# Patient Record
Sex: Male | Born: 1975
Health system: Southern US, Community
[De-identification: ages and names within clinical notes are randomized; demographics above are authoritative.]

## PROBLEM LIST (undated history)

## (undated) DIAGNOSIS — F419 Anxiety disorder, unspecified: Secondary | ICD-10-CM

## (undated) DIAGNOSIS — F909 Attention-deficit hyperactivity disorder, unspecified type: Secondary | ICD-10-CM

## (undated) HISTORY — PX: KNEE SURGERY: SHX244

## (undated) HISTORY — DX: Attention-deficit hyperactivity disorder, unspecified type: F90.9

## (undated) HISTORY — DX: Anxiety disorder, unspecified: F41.9

## (undated) HISTORY — PX: APPENDECTOMY: SHX54

---

## 2009-08-02 ENCOUNTER — Ambulatory Visit: Payer: Self-pay | Admitting: Urology

## 2009-08-12 ENCOUNTER — Emergency Department: Payer: Self-pay | Admitting: Emergency Medicine

## 2014-10-10 ENCOUNTER — Ambulatory Visit: Payer: Self-pay | Admitting: General Surgery

## 2014-10-12 ENCOUNTER — Ambulatory Visit (INDEPENDENT_AMBULATORY_CARE_PROVIDER_SITE_OTHER): Payer: Managed Care, Other (non HMO) | Admitting: General Surgery

## 2014-10-12 ENCOUNTER — Encounter: Payer: Self-pay | Admitting: General Surgery

## 2014-10-12 VITALS — BP 120/74 | HR 80 | Resp 12 | Ht 66.0 in | Wt 165.0 lb

## 2014-10-12 DIAGNOSIS — R229 Localized swelling, mass and lump, unspecified: Secondary | ICD-10-CM

## 2014-10-12 DIAGNOSIS — R222 Localized swelling, mass and lump, trunk: Secondary | ICD-10-CM

## 2014-10-12 NOTE — Patient Instructions (Addendum)
Patient to have a excision right back mass.   Patient's surgery has been scheduled for 10-17-14 at [facility].

## 2014-10-12 NOTE — Progress Notes (Signed)
Patient ID: Joel Burgess, male   DOB: Mar 04, 1976, 38 y.o.   MRN: [id number]  Chief Complaint  Patient presents with  . Other    lump on back    HPI Joel Burgess is a 38 y.o. male here today for a evaluation of a lump on back.Patient states he noticed this area about month ago. He slates the area is getting bigger.Pain with activity.   HPI  History reviewed. No pertinent past medical history.  Past Surgical History  Procedure Laterality Date  . Appendectomy      History reviewed. No pertinent family history.  Social History History  Substance Use Topics  . Smoking status: Never Smoker   . Smokeless tobacco: Never Used  . Alcohol Use: No    Allergies  Allergen Reactions  . Penicillins Swelling    Current Outpatient Prescriptions  Medication Sig Dispense Refill  . amphetamine-dextroamphetamine (ADDERALL) 30 MG tablet Take 30 mg by mouth daily.        No current facility-administered medications for this visit.    Review of Systems Review of Systems  Constitutional: Negative.   Respiratory: Negative.   Cardiovascular: Negative.     Blood pressure 120/74, pulse 80, resp. rate 12, height 5\' 6"  (1.676 m), weight 165 lb (74.844 kg).  Physical Exam Physical Exam  Constitutional: He is oriented to person, place, and time. He appears well-developed and well-nourished.  Eyes: Conjunctivae are normal.  Neck: Neck supple. No tracheal deviation present. No thyromegaly present.  Cardiovascular: Normal rate, regular rhythm and normal heart sounds.   Pulmonary/Chest: Effort normal and breath sounds normal.  Neurological: He is alert and oriented to person, place, and time.  Skin: Skin is warm and dry.  6 by 4 mass in the upper right back below the scapula.  No fixity to muscle  Data Reviewed Notes reviewed  Assessment    Right back lipoma     Plan    Discussed excision on back. Patient agrees. Feel this is best done under MAC.    Patient's surgery has been  scheduled for 10-17-14 at [facility].   Tyquon Near G 10/12/2014, 10:07 AM

## 2014-10-17 ENCOUNTER — Encounter: Payer: Self-pay | Admitting: General Surgery

## 2014-10-17 ENCOUNTER — Ambulatory Visit: Payer: Self-pay | Admitting: General Surgery

## 2014-10-17 DIAGNOSIS — D171 Benign lipomatous neoplasm of skin and subcutaneous tissue of trunk: Secondary | ICD-10-CM

## 2014-10-18 ENCOUNTER — Encounter: Payer: Self-pay | Admitting: General Surgery

## 2014-10-19 ENCOUNTER — Telehealth: Payer: Self-pay | Admitting: *Deleted

## 2014-10-19 NOTE — Telephone Encounter (Signed)
Notified patient as instructed, patient pleased. He is using over the counter pain medications. Discussed follow-up appointments, patient agrees.

## 2014-10-19 NOTE — Telephone Encounter (Signed)
Message copied by Carson Myrtle on Wed Oct 19, 2014  9:44 AM ------      Message from: Christene Lye      Created: Wed Oct 19, 2014  6:30 AM       Please let pt pt know the pathology was normal. ------

## 2014-10-27 ENCOUNTER — Ambulatory Visit: Payer: Managed Care, Other (non HMO) | Admitting: General Surgery

## 2014-11-03 ENCOUNTER — Encounter: Payer: Self-pay | Admitting: *Deleted

## 2015-03-31 DIAGNOSIS — F411 Generalized anxiety disorder: Secondary | ICD-10-CM | POA: Insufficient documentation

## 2015-03-31 DIAGNOSIS — F988 Other specified behavioral and emotional disorders with onset usually occurring in childhood and adolescence: Secondary | ICD-10-CM | POA: Insufficient documentation

## 2015-04-15 NOTE — Op Note (Signed)
PATIENT NAME:  Joel Burgess, Joel Burgess MR#:  [id number] DATE OF BIRTH:  10/26/1976  DATE OF PROCEDURE:  10/17/2014  PREOPERATIVE DIAGNOSIS: Lipoma, upper back, right.   POSTOPERATIVE DIAGNOSIS: Lipoma, upper back, right.  OPERATION: Excision of subcutaneous lipoma from the right upper back, below the right scapula.   SURGEON: Mckinley Jewel, MD   ANESTHESIA: Monitored anesthesia care using of 0.5% Marcaine mixed with 1% Xylocaine; a total of 20 mL.   COMPLICATIONS: None.   DRAINS: None.   PROCEDURE IN DETAIL: The patient was placed in the left lateral position, after he was adequately monitored and sedated and held in place with a bean bag. The mass in question was about a 5-6 cm long mass located below the level of the inferior angle of the scapula. This area was prepped and draped out as a sterile field. Timeout was performed. Local anesthetic was instilled to obtain an adequate block overlying the mass.   A skin incision was made along the long axis of this mass, about 3 cm in size and carefully deepened through into the subcutaneous tissue, where the lipomatous mass was encountered. It was somewhat firmly stuck in places to the skin, and also in the areas to the fascia, but did not penetrate the fascia. Using blunt and sharp dissection, this was excised out fully and sent to pathology.   Hemostasis was obtained with cautery. The deeper tissue was closed with 3-0 Vicryl interrupted stitches and the skin was closed with subcuticular 3-0 Monocryl, reinforced with Steri-Strips. Tincture of benzoin and dry sterile dressing was placed.   The patient tolerated the procedure well with no immediate problems encountered. He was subsequently returned to the recovery room in stable condition.   ____________________________ S.Robinette Haines, MD sgs:MT D: 10/17/2014 08:27:22 ET T: 10/17/2014 15:18:45 ET JOB#: [id number]  cc: Synthia Innocent. Jamal Collin, MD, <Dictator> [facility] Robinette Haines MD ELECTRONICALLY SIGNED 10/17/2014  17:55

## 2015-04-17 LAB — SURGICAL PATHOLOGY

## 2015-06-27 ENCOUNTER — Telehealth: Payer: Self-pay | Admitting: Psychiatry

## 2015-07-03 ENCOUNTER — Telehealth: Payer: Self-pay | Admitting: Psychiatry

## 2015-07-04 NOTE — Telephone Encounter (Signed)
See the other note that I already address this question. Unable to replace Adderall prescriptions.

## 2015-07-04 NOTE — Telephone Encounter (Signed)
Please advise patient that I cannot refill or replace scheduled 2 prescriptions.

## 2015-07-25 ENCOUNTER — Ambulatory Visit (INDEPENDENT_AMBULATORY_CARE_PROVIDER_SITE_OTHER): Payer: Managed Care, Other (non HMO) | Admitting: Psychiatry

## 2015-07-25 ENCOUNTER — Encounter: Payer: Self-pay | Admitting: Psychiatry

## 2015-07-25 VITALS — BP 124/80 | HR 76 | Temp 97.8°F | Ht 66.0 in | Wt 169.4 lb

## 2015-07-25 DIAGNOSIS — F902 Attention-deficit hyperactivity disorder, combined type: Secondary | ICD-10-CM

## 2015-07-25 DIAGNOSIS — F411 Generalized anxiety disorder: Secondary | ICD-10-CM | POA: Diagnosis not present

## 2015-07-25 MED ORDER — AMPHETAMINE-DEXTROAMPHETAMINE 30 MG PO TABS: 30.0000 mg | ORAL_TABLET | Freq: Every day | ORAL | Status: DC

## 2015-07-25 MED ORDER — ALPRAZOLAM 1 MG PO TABS: 1.0000 mg | ORAL_TABLET | Freq: Two times a day (BID) | ORAL | Status: DC | PRN

## 2015-07-25 NOTE — Progress Notes (Signed)
[facility] MD Progress Note  07/25/2015 10:11 PM Joel Burgess  MRN:  [id number] Subjective:  Follow-up for this patient with ADHD and anxiety. Principal Problem: @PPROB @ Diagnosis:  Attention deficit hyperactivity disorder Patient Active Problem List   Diagnosis Date Noted  . Anxiety, generalized [F41.1] 03/31/2015  . ADD (attention deficit disorder) [F90.9] 03/31/2015   Total Time spent with patient: 30 minutes   Past Medical History:  Past Medical History  Diagnosis Date  . ADHD (attention deficit hyperactivity disorder)   . Anxiety     Past Surgical History  Procedure Laterality Date  . Appendectomy     Family History:  Family History  Problem Relation Age of Onset  . Anxiety disorder Mother   . Depression Mother   . Hypertension Mother   . Anxiety disorder Sister   . ADD / ADHD Sister   . Bipolar disorder Sister   . Hypertension Father   . Depression Father   . Bipolar disorder Maternal Uncle   . Schizophrenia Maternal Uncle    Social History:  History  Alcohol Use No     History  Drug Use No    History   Social History  . Marital Status: Married    Spouse Name: N/A  . Number of Children: N/A  . Years of Education: N/A   Social History Main Topics  . Smoking status: Never Smoker   . Smokeless tobacco: Never Used  . Alcohol Use: No  . Drug Use: No  . Sexual Activity: Yes    Birth Control/ Protection: None   Other Topics Concern  . None   Social History Narrative   Additional History:    Sleep: Good  Appetite:  Good   Assessment: since our last visit he has gone several weeks without his medicine because he claims that he destroyed one of the prescriptions by accident and the laundry. I was not able to give him a new one. Therefore he has had a period of time of having more disorganization and work and being a little slower with less concentration. Mood however is been stable. Continues to sleep well. His son is scheduled to come home to live  within another couple weeks. He is very much looking forward to this. No sign of any side effects or complications of medicine  Musculoskeletal: Strength & Muscle Tone: within normal limits Gait & Station: normal Patient leans: N/A   Psychiatric Specialty Exam: Physical Exam  Constitutional: He appears well-developed and well-nourished.  HENT:  Head: Normocephalic and atraumatic.  Eyes: Conjunctivae are normal. Pupils are equal, round, and reactive to light.  Neck: Normal range of motion.  Cardiovascular: Normal heart sounds.   Respiratory: Effort normal.  GI: Soft.  Musculoskeletal: Normal range of motion.  Neurological: He is alert.  Skin: Skin is warm and dry.  Psychiatric: He has a normal mood and affect. His speech is normal and behavior is normal. Judgment and thought content normal. Cognition and memory are normal.    Review of Systems  Constitutional: Negative.   HENT: Negative.   Eyes: Negative.   Respiratory: Negative.   Cardiovascular: Negative.   Gastrointestinal: Negative.   Musculoskeletal: Negative.   Skin: Negative.   Neurological: Negative.   Psychiatric/Behavioral: Negative for depression, suicidal ideas, hallucinations, memory loss and substance abuse. The patient is not nervous/anxious and does not have insomnia.     Blood pressure 124/80, pulse 76, temperature 97.8 F (36.6 C), temperature source Tympanic, height 5\' 6"  (1.676 m), weight 169 lb 6.4  oz (76.839 kg), SpO2 91 %.Body mass index is 27.35 kg/(m^2).  General Appearance: Casual  Eye Contact::  Good  Speech:  Clear and Coherent  Volume:  Normal  Mood:  Negative  Affect:  Negative  Thought Process:  Goal Directed  Orientation:  Full (Time, Place, and Person)  Thought Content:  Negative  Suicidal Thoughts:  No  Homicidal Thoughts:  No  Memory:  Immediate;   Good Recent;   Good Remote;   Good  Judgement:  Intact  Insight:  Present  Psychomotor Activity:  Normal  Concentration:  Good   Recall:  Good  Fund of Knowledge:Good  Language: Fair  Akathisia:  No  Handed:  Right  AIMS (if indicated):     Assets:  Communication Skills Desire for Improvement Housing Physical Health Resilience Social Support  ADL's:  Intact  Cognition: WNL  Sleep:        Current Medications: Current Outpatient Prescriptions  Medication Sig Dispense Refill  . ALPRAZolam (XANAX) 1 MG tablet Take 1 tablet (1 mg total) by mouth 2 (two) times daily as needed for anxiety. 30 tablet 2  . amphetamine-dextroamphetamine (ADDERALL) 30 MG tablet Take 1 tablet by mouth daily. 30 tablet 0  . diazepam (VALIUM) 10 MG tablet      No current facility-administered medications for this visit.    Lab Results: No results found for this or any previous visit (from the past 48 hour(s)).  Physical Findings: AIMS:  , ,  ,  ,    CIWA:    COWS:     Treatment Plan Summary: Medication management and Plan patient was given 3 new prescription for sore his Adderall. One of them that could be filled immediately and 2 more that I wrote out on paper that are dated for the next 2 months. Continue the Xanax at a new prescription was given for that. Reviewed medicine side effects. Supportive counseling especially regarding his situation with his son. Follow-up in 3 months. He agrees to the plan.   Medical Decision Making:  Established Problem, Stable/Improving (1), Review of Psycho-Social Stressors (1) and Review of Medication Regimen & Side Effects (2)     Joel Burgess 07/25/2015, 10:11 PM

## 2015-07-26 ENCOUNTER — Other Ambulatory Visit: Payer: Self-pay

## 2015-07-26 DIAGNOSIS — F988 Other specified behavioral and emotional disorders with onset usually occurring in childhood and adolescence: Secondary | ICD-10-CM

## 2015-07-26 NOTE — Telephone Encounter (Signed)
Patient was just seen yesterday and was given 3 prescriptions for this medication. I'm unclear what I am supposed to do with this current request.

## 2015-07-26 NOTE — Telephone Encounter (Signed)
pt called. pt states he noticed on his rx yesterday that you only gave him #30 tablets for his adderall. pt states that he take the adderall twice a day so he needs #60 pills in order to have a month supply.

## 2015-07-27 NOTE — Telephone Encounter (Signed)
the rx for pt adderall was only written for #30 tablets not the #60 tablets pt will need new rx for #60 tablets in order to last him the whole month.

## 2015-09-06 NOTE — Telephone Encounter (Signed)
I left a voicemail for him that I will write new prescriptions for the correct amount. He can pick them up at the front desk and please bring in the other prescriptions to trade in for them.

## 2015-10-24 ENCOUNTER — Ambulatory Visit: Payer: Managed Care, Other (non HMO) | Admitting: Psychiatry

## 2015-11-08 ENCOUNTER — Ambulatory Visit: Payer: Self-pay | Admitting: Family Medicine

## 2015-11-08 ENCOUNTER — Encounter: Payer: Self-pay | Admitting: Family Medicine

## 2015-11-08 ENCOUNTER — Ambulatory Visit (INDEPENDENT_AMBULATORY_CARE_PROVIDER_SITE_OTHER): Payer: Managed Care, Other (non HMO) | Admitting: Family Medicine

## 2015-11-08 VITALS — BP 118/64 | HR 70 | Temp 98.1°F | Resp 16 | Wt 170.0 lb

## 2015-11-08 DIAGNOSIS — H6092 Unspecified otitis externa, left ear: Secondary | ICD-10-CM

## 2015-11-08 DIAGNOSIS — F411 Generalized anxiety disorder: Secondary | ICD-10-CM

## 2015-11-08 DIAGNOSIS — M62838 Other muscle spasm: Secondary | ICD-10-CM

## 2015-11-08 DIAGNOSIS — J45909 Unspecified asthma, uncomplicated: Secondary | ICD-10-CM

## 2015-11-08 MED ORDER — DIAZEPAM 10 MG PO TABS: 10.0000 mg | ORAL_TABLET | Freq: Every day | ORAL | Status: DC | PRN

## 2015-11-08 MED ORDER — ALBUTEROL SULFATE HFA 108 (90 BASE) MCG/ACT IN AERS: 2.0000 | INHALATION_SPRAY | Freq: Four times a day (QID) | RESPIRATORY_TRACT | Status: DC | PRN

## 2015-11-08 MED ORDER — AZITHROMYCIN 250 MG PO TABS: ORAL_TABLET | ORAL | Status: AC

## 2015-11-08 MED ORDER — ALPRAZOLAM 1 MG PO TABS: 1.0000 mg | ORAL_TABLET | Freq: Two times a day (BID) | ORAL | Status: DC | PRN

## 2015-11-08 MED ORDER — NEOMYCIN-POLYMYXIN-HC 1 % OT SOLN: 3.0000 [drp] | Freq: Four times a day (QID) | OTIC | Status: DC

## 2015-11-08 NOTE — Progress Notes (Signed)
Patient: Joel Burgess Male    DOB: [birth year]   39 y.o.   MRN: [id number] Visit Date: 11/08/2015  Today's Provider: Lelon Huh, MD   Chief Complaint  Patient presents with  . Ear Pain    x 3 days   Subjective:    HPI  Ear pain: Patient comes in stating he has had left ear pain for 3 days. Patient states the pain is worsening. Pain is described as a sharp pain at times and sometimes a throbbing pain. Patient is aggravated with loud noises and cold air. Patient has tried taking Ibuprofen with little to no relief. Patient states he has also had pain in his neck. Patient has also had intermittent fever since the onset of the ear pain.   Patient also requests refills for alprazolam, which he takes a few times a week for anxiety and is working well. He also takes diazepam occasionally for muscle pains which he states works well. He does not take both medications together  He states he sometimes gets mild wheezing when he has a cold or during allergies season. He was previously prescribed albuterol inhaler which worked well, but has run out. He requests a refill for this.     Allergies  Allergen Reactions  . Penicillins Swelling   Previous Medications   ALPRAZOLAM (XANAX) 1 MG TABLET    Take 1 tablet (1 mg total) by mouth 2 (two) times daily as needed for anxiety.   AMPHETAMINE-DEXTROAMPHETAMINE (ADDERALL) 30 MG TABLET    Take 1 tablet by mouth daily.   DIAZEPAM (VALIUM) 10 MG TABLET    Take 10 mg by mouth daily as needed.     Review of Systems  Constitutional: Positive for fever. Negative for chills, diaphoresis, appetite change and fatigue.  HENT: Positive for ear pain (left ear). Negative for congestion, dental problem, ear discharge, facial swelling, hearing loss, mouth sores, nosebleeds, postnasal drip, rhinorrhea, sinus pressure, sneezing, sore throat, tinnitus, trouble swallowing and voice change.   Respiratory: Negative for chest tightness, shortness of breath and  wheezing.   Cardiovascular: Negative for chest pain and palpitations.  Gastrointestinal: Negative for nausea, vomiting and abdominal pain.  Musculoskeletal: Positive for neck pain.  Neurological: Positive for headaches.    Social History  Substance Use Topics  . Smoking status: Never Smoker   . Smokeless tobacco: Never Used  . Alcohol Use: No   Objective:   BP 118/64 mmHg  Pulse 70  Temp(Src) 98.1 F (36.7 C) (Oral)  Resp 16  Wt 170 lb (77.111 kg)  SpO2 99%  Physical Exam  General Appearance:    Alert, cooperative, no distress  HENT:  Left ear canal and TM mildly erythematous. neck without nodes, throat normal without erythema or exudate, sinuses nontender and nasal mucosa congested  Eyes:    PERRL, conjunctiva/corneas clear, EOM's intact       Lungs:     Clear to auscultation bilaterally, respirations unlabored  Heart:    Regular rate and rhythm  Neurologic:   Awake, alert, oriented x 3. No apparent focal neurological           defect.           Assessment & Plan:     1. Anxiety, generalized Does well with occasional alprazolam. Refilled today - ALPRAZolam (XANAX) 1 MG tablet; Take 1 tablet (1 mg total) by mouth 2 (two) times daily as needed for anxiety.  Dispense: 30 tablet; Refill: 5  2. Otitis externa,  left  - NEOMYCIN-POLYMYXIN-HYDROCORTISONE (CORTISPORIN) 1 % SOLN otic solution; Place 3 drops into the left ear 4 (four) times daily.  Dispense: 10 mL; Refill: 0  3. Muscle spasm Does well with occasional diazepam. Refilled today.  - diazepam (VALIUM) 10 MG tablet; Take 1 tablet (10 mg total) by mouth daily as needed (for musce spasm).  Dispense: 30 tablet; Refill: 5  4. Reactive airway disease, unspecified asthma severity, uncomplicated  - albuterol (PROVENTIL HFA;VENTOLIN HFA) 108 (90 BASE) MCG/ACT inhaler; Inhale 2 puffs into the lungs every 6 (six) hours as needed for wheezing.  Dispense: 1 Inhaler; Refill: 0       Lelon Huh, MD  [facility]

## 2015-11-23 ENCOUNTER — Encounter: Payer: Self-pay | Admitting: Psychiatry

## 2015-11-23 ENCOUNTER — Ambulatory Visit (INDEPENDENT_AMBULATORY_CARE_PROVIDER_SITE_OTHER): Payer: Managed Care, Other (non HMO) | Admitting: Psychiatry

## 2015-11-23 VITALS — BP 124/68 | HR 75 | Temp 97.6°F | Ht 66.0 in | Wt 168.6 lb

## 2015-11-23 DIAGNOSIS — F902 Attention-deficit hyperactivity disorder, combined type: Secondary | ICD-10-CM | POA: Diagnosis not present

## 2015-11-23 DIAGNOSIS — F411 Generalized anxiety disorder: Secondary | ICD-10-CM | POA: Diagnosis not present

## 2015-11-23 MED ORDER — AMPHETAMINE-DEXTROAMPHETAMINE 30 MG PO TABS: 30.0000 mg | ORAL_TABLET | Freq: Two times a day (BID) | ORAL | Status: DC

## 2015-11-23 NOTE — Progress Notes (Signed)
[facility] MD Progress Note  11/23/2015 2:00 PM Joel Burgess  MRN:  [id number] Subjective:  Follow-up for this patient with a history of ADHD and anxiety. Patient has no new complaints today. He states that his anxiety symptoms are well controlled. His focus and concentration are good. He seems a little bit confused to me today. When I ask him about how long his son had been back home he told me that it had been almost a year when I know that 3 months ago he told me his son had not come home yet. I wonder if he is slightly intoxicated or fatigued today. I did discover by checking the old notes and data base that he has been getting extra Xanax from his primary care doctor as well as Valium. Therefore I will be canceling his Xanax prescription here. I don't see that he's been getting any extra stimulants. Patient appears to be stable. We will continue the current dose of ADHD medicine and follow-up in 3 months. Principal Problem: @PPROB @ Diagnosis:   Patient Active Problem List   Diagnosis Date Noted  . Anxiety, generalized [F41.1] 03/31/2015  . ADD (attention deficit disorder) [F90.9] 03/31/2015   Total Time spent with patient: 25 minutes  Past Psychiatric History: Patient has a past history of anxiety and ADHD no suicide attempts  Past Medical History:  Past Medical History  Diagnosis Date  . ADHD (attention deficit hyperactivity disorder)   . Anxiety     Past Surgical History  Procedure Laterality Date  . Appendectomy     Family History:  Family History  Problem Relation Age of Onset  . Anxiety disorder Mother   . Depression Mother   . Hypertension Mother   . Anxiety disorder Sister   . ADD / ADHD Sister   . Bipolar disorder Sister   . Hypertension Father   . Depression Father   . Bipolar disorder Maternal Uncle   . Schizophrenia Maternal Uncle    Family Psychiatric  History: Family history positive for ADHD Social History:  History  Alcohol Use No     History  Drug Use No     Social History   Social History  . Marital Status: Married    Spouse Name: N/A  . Number of Children: 2  . Years of Education: N/A   Occupational History  . 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 Smoking status: Never Smoker   . Smokeless tobacco: Never Used  . Alcohol Use: No  . Drug Use: No  . Sexual Activity: Yes    Birth Control/ Protection: None   Other Topics Concern  . None   Social History Narrative   Additional Social History:                         Sleep: Good  Appetite:  Good  Current Medications: Current Outpatient Prescriptions  Medication Sig Dispense Refill  . albuterol (PROVENTIL HFA;VENTOLIN HFA) 108 (90 BASE) MCG/ACT inhaler Inhale 2 puffs into the lungs every 6 (six) hours as needed for wheezing. 1 Inhaler 0  . amphetamine-dextroamphetamine (ADDERALL) 30 MG tablet Take 1 tablet by mouth 2 (two) times daily. 60 tablet 0  . amphetamine-dextroamphetamine (ADDERALL) 30 MG tablet Take 1 tablet by mouth 2 (two) times daily. 60 tablet 0  . amphetamine-dextroamphetamine (ADDERALL) 30 MG tablet Take 1 tablet by mouth 2 (two) times daily. 60 tablet 0  . diazepam (VALIUM) 10 MG tablet Take 1  tablet (10 mg total) by mouth daily as needed (for musce spasm). 30 tablet 5  . NEOMYCIN-POLYMYXIN-HYDROCORTISONE (CORTISPORIN) 1 % SOLN otic solution Place 3 drops into the left ear 4 (four) times daily. 10 mL 0   No current facility-administered medications for this visit.    Lab Results: No results found for this or any previous visit (from the past 48 hour(s)).  Physical Findings: AIMS:  , ,  ,  ,    CIWA:    COWS:     Musculoskeletal: Strength & Muscle Tone: within normal limits Gait & Station: normal Patient leans: N/A  Psychiatric Specialty Exam: ROS  Blood pressure 124/68, pulse 75, temperature 97.6 F (36.4 C), temperature source Tympanic, height 5\' 6"  (1.676 m), weight 168 lb 9.6 oz (76.476 kg), SpO2 95 %.Body mass index  is 27.23 kg/(m^2).  General Appearance: Fairly Groomed  Engineer, water::  Fair  Speech:  Normal Rate  Volume:  Normal  Mood:  Euthymic  Affect:  Congruent  Thought Process:  Coherent  Orientation:  Full (Time, Place, and Person)  Thought Content:  Negative  Suicidal Thoughts:  No  Homicidal Thoughts:  No  Memory:  Immediate;   Good Recent;   Fair Remote;   Fair  Judgement:  Intact  Insight:  Present  Psychomotor Activity:  Normal  Concentration:  Fair  Recall:  [employer] of Knowledge:Fair  Language: Fair  Akathisia:  No  Handed:  Right  AIMS (if indicated):     Assets:  Communication Skills Desire for Improvement Financial Resources/Insurance Housing Physical Health Resilience  ADL's:  Intact  Cognition: WNL  Sleep:      Treatment Plan Summary: Medication management and Plan review medication usage and side effects. Discontinue Xanax prescription. Continue Adderall 30 mg twice a day. 3 prescriptions written. Follow-up in another 3 months.  Deedra Pro 11/23/2015, 2:00 PM

## 2015-11-25 ENCOUNTER — Other Ambulatory Visit: Payer: Self-pay | Admitting: Psychiatry

## 2015-11-27 NOTE — Telephone Encounter (Signed)
No. I have evidence that the patient has been doctor shopping and receiving prescriptions for sedatives from his primary care doctor and from me. No new prescription is needed at this time.

## 2016-02-15 ENCOUNTER — Other Ambulatory Visit: Payer: Self-pay | Admitting: Family Medicine

## 2016-02-16 NOTE — Telephone Encounter (Signed)
Rx called in to pharmacy. 

## 2016-02-16 NOTE — Telephone Encounter (Signed)
Please call in alprazolam.  

## 2016-02-22 ENCOUNTER — Ambulatory Visit: Payer: Managed Care, Other (non HMO) | Admitting: Psychiatry

## 2016-02-26 ENCOUNTER — Ambulatory Visit: Payer: Managed Care, Other (non HMO) | Admitting: Psychiatry

## 2016-03-04 ENCOUNTER — Encounter: Payer: Self-pay | Admitting: Psychiatry

## 2016-03-04 ENCOUNTER — Ambulatory Visit (INDEPENDENT_AMBULATORY_CARE_PROVIDER_SITE_OTHER): Payer: Managed Care, Other (non HMO) | Admitting: Psychiatry

## 2016-03-04 VITALS — BP 124/80 | HR 74 | Temp 98.0°F | Ht 66.0 in | Wt 169.4 lb

## 2016-03-04 DIAGNOSIS — F902 Attention-deficit hyperactivity disorder, combined type: Secondary | ICD-10-CM

## 2016-03-04 DIAGNOSIS — F411 Generalized anxiety disorder: Secondary | ICD-10-CM

## 2016-03-04 MED ORDER — AMPHETAMINE-DEXTROAMPHETAMINE 30 MG PO TABS: 30.0000 mg | ORAL_TABLET | Freq: Two times a day (BID) | ORAL | Status: DC

## 2016-03-04 NOTE — Progress Notes (Signed)
[facility] MD Progress Note  03/04/2016 2:45 PM Joel Burgess  MRN:  [id number] Subjective: Follow-up for a 40 year old man with ADHD and anxiety.Patient has no new complaint. Mood has been good. No signs of depression. Sleep is good. No new health problems. Appetite is good. His work is going well. His relationship with his family is going well. No real problems with having his son back home. He is not reporting any problems with concentration or focus. No side effects from medicine. Good control of symptoms. Principal Problem: @PPROB @ Diagnosis:   Patient Active Problem List   Diagnosis Date Noted  . Anxiety, generalized [F41.1] 03/31/2015  . ADD (attention deficit disorder) [F90.9] 03/31/2015   Total Time spent with patient: 25  Past Psychiatric History: Past history of ADHD and anxiety no history of suicide attempts no history of psychosis good response to medicine.  Past Medical History:  Past Medical History  Diagnosis Date  . ADHD (attention deficit hyperactivity disorder)   . Anxiety     Past Surgical History  Procedure Laterality Date  . Appendectomy     Family History:  Family History  Problem Relation Age of Onset  . Anxiety disorder Mother   . Depression Mother   . Hypertension Mother   . Anxiety disorder Sister   . ADD / ADHD Sister   . Bipolar disorder Sister   . Hypertension Father   . Depression Father   . Bipolar disorder Maternal Uncle   . Schizophrenia Maternal Uncle    Family Psychiatric  History: Family history likely positive for ADHD and has a son with some mood instability Social History:  History  Alcohol Use No     History  Drug Use No    Social History   Social History  . Marital Status: Married    Spouse Name: N/A  . Number of Children: 2  . Years of Education: N/A   Occupational History  . 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 Smoking status: Never Smoker   . Smokeless tobacco: Never Used  . Alcohol Use: No  . Drug  Use: No  . Sexual Activity: Yes    Birth Control/ Protection: None   Other Topics Concern  . None   Social History Narrative   Additional Social History:                         Sleep: Good  Appetite:  Good  Current Medications: Current Outpatient Prescriptions  Medication Sig Dispense Refill  . albuterol (PROVENTIL HFA;VENTOLIN HFA) 108 (90 BASE) MCG/ACT inhaler Inhale 2 puffs into the lungs every 6 (six) hours as needed for wheezing. 1 Inhaler 0  . ALPRAZolam (XANAX) 1 MG tablet TAKE ONE TABLET BY MOUTH TWICE DAILY AS NEEDED FOR ANXIETY 30 tablet 5  . amphetamine-dextroamphetamine (ADDERALL) 30 MG tablet Take 1 tablet by mouth 2 (two) times daily. 60 tablet 0  . amphetamine-dextroamphetamine (ADDERALL) 30 MG tablet Take 1 tablet by mouth 2 (two) times daily. 60 tablet 0  . amphetamine-dextroamphetamine (ADDERALL) 30 MG tablet Take 1 tablet by mouth 2 (two) times daily. 60 tablet 0  . amphetamine-dextroamphetamine (ADDERALL) 30 MG tablet Take 1 tablet by mouth 2 (two) times daily. 60 tablet 0  . amphetamine-dextroamphetamine (ADDERALL) 30 MG tablet Take 1 tablet by mouth 2 (two) times daily. 60 tablet 0  . diazepam (VALIUM) 10 MG tablet Take 1 tablet (10 mg total) by mouth daily as needed (for musce  spasm). 30 tablet 5  . NEOMYCIN-POLYMYXIN-HYDROCORTISONE (CORTISPORIN) 1 % SOLN otic solution Place 3 drops into the left ear 4 (four) times daily. 10 mL 0   No current facility-administered medications for this visit.    Lab Results: No results found for this or any previous visit (from the past 48 hour(s)).  Blood Alcohol level:  No results found for: [facility]  Physical Findings: AIMS:  , ,  ,  ,    CIWA:    COWS:     Musculoskeletal: Strength & Muscle Tone: within normal limits Gait & Station: normal Patient leans: N/A  Psychiatric Specialty Exam: ROS  Blood pressure 124/80, pulse 74, temperature 98 F (36.7 C), temperature source Tympanic, height 5\' 6"  (1.676  m), weight 76.839 kg (169 lb 6.4 oz), SpO2 96 %.Body mass index is 27.35 kg/(m^2).  General Appearance: Casual  Eye Contact::  Fair  Speech:  Clear and Coherent  Volume:  Normal  Mood:  Euthymic  Affect:  Congruent  Thought Process:  Goal Directed  Orientation:  Full (Time, Place, and Person)  Thought Content:  Negative  Suicidal Thoughts:  No  Homicidal Thoughts:  No  Memory:  Immediate;   Good Recent;   Good    Judgement:  Good  Insight:  Good  Psychomotor Activity:  Normal  Concentration:  Good  Recall:  Good  Fund of Knowledge:Good  Language: Good  Akathisia:  No  Handed:  Right  AIMS (if indicated):     Assets:  Communication Skills Desire for Improvement Financial Resources/Insurance Housing Physical Health Resilience Social Support  ADL's:  Intact  Cognition: WNL  Sleep:      Treatment Plan Summary: Medication management and Plan doing well with Adderall. Reviewed use of medicine and side effects. 3 new prescriptions done each one for 1 month consecutively. Plan to follow-up with him in another 3 months. No new prescriptions done. He does continue to take some benzodiazepines from primary care doctor. Supportive counseling completed. He knows he can call sooner if needed.  Alethia Berthold, MD 03/04/2016, 2:45 PM

## 2016-03-05 ENCOUNTER — Encounter: Payer: Self-pay | Admitting: Family Medicine

## 2016-03-05 ENCOUNTER — Ambulatory Visit (INDEPENDENT_AMBULATORY_CARE_PROVIDER_SITE_OTHER): Payer: Managed Care, Other (non HMO) | Admitting: Family Medicine

## 2016-03-05 VITALS — BP 110/74 | HR 71 | Temp 98.8°F | Resp 16 | Wt 169.0 lb

## 2016-03-05 DIAGNOSIS — J029 Acute pharyngitis, unspecified: Secondary | ICD-10-CM | POA: Diagnosis not present

## 2016-03-05 DIAGNOSIS — J45909 Unspecified asthma, uncomplicated: Secondary | ICD-10-CM

## 2016-03-05 DIAGNOSIS — J069 Acute upper respiratory infection, unspecified: Secondary | ICD-10-CM | POA: Diagnosis not present

## 2016-03-05 DIAGNOSIS — R52 Pain, unspecified: Secondary | ICD-10-CM

## 2016-03-05 LAB — POCT INFLUENZA A/B
INFLUENZA A, POC: NEGATIVE
INFLUENZA B, POC: NEGATIVE

## 2016-03-05 LAB — POCT RAPID STREP A (OFFICE): RAPID STREP A SCREEN: NEGATIVE

## 2016-03-05 MED ORDER — ALBUTEROL SULFATE HFA 108 (90 BASE) MCG/ACT IN AERS: 2.0000 | INHALATION_SPRAY | Freq: Four times a day (QID) | RESPIRATORY_TRACT | Status: DC | PRN

## 2016-03-05 NOTE — Patient Instructions (Signed)
Upper Respiratory Infection, Adult Most upper respiratory infections (URIs) are a viral infection of the air passages leading to the lungs. A URI affects the nose, throat, and upper air passages. The most common type of URI is nasopharyngitis and is typically referred to as "the common cold." URIs run their course and usually go away on their own. Most of the time, a URI does not require medical attention, but sometimes a bacterial infection in the upper airways can follow a viral infection. This is called a secondary infection. Sinus and middle ear infections are common types of secondary upper respiratory infections. Bacterial pneumonia can also complicate a URI. A URI can worsen asthma and chronic obstructive pulmonary disease (COPD). Sometimes, these complications can require emergency medical care and may be life threatening.  CAUSES Almost all URIs are caused by viruses. A virus is a type of germ and can spread from one person to another.  RISKS FACTORS You may be at risk for a URI if:   You smoke.   You have chronic heart or lung disease.  You have a weakened defense (immune) system.   You are very young or very old.   You have nasal allergies or asthma.  You work in crowded or poorly ventilated areas.  You work in health care facilities or schools. SIGNS AND SYMPTOMS  Symptoms typically develop 2-3 days after you come in contact with a cold virus. Most viral URIs last 7-10 days. However, viral URIs from the influenza virus (flu virus) can last 14-18 days and are typically more severe. Symptoms may include:   Runny or stuffy (congested) nose.   Sneezing.   Cough.   Sore throat.   Headache.   Fatigue.   Fever.   Loss of appetite.   Pain in your forehead, behind your eyes, and over your cheekbones (sinus pain).  Muscle aches.  DIAGNOSIS  Your health care provider may diagnose a URI by:  Physical exam.  Tests to check that your symptoms are not due to  another condition such as:  Strep throat.  Sinusitis.  Pneumonia.  Asthma. TREATMENT  A URI goes away on its own with time. It cannot be cured with medicines, but medicines may be prescribed or recommended to relieve symptoms. Medicines may help:  Reduce your fever.  Reduce your cough.  Relieve nasal congestion. HOME CARE INSTRUCTIONS   Take medicines only as directed by your health care provider.   Gargle warm saltwater or take cough drops to comfort your throat as directed by your health care provider.  Use a warm mist humidifier or inhale steam from a shower to increase air moisture. This may make it easier to breathe.  Drink enough fluid to keep your urine clear or pale yellow.   Eat soups and other clear broths and maintain good nutrition.   Rest as needed.   Return to work when your temperature has returned to normal or as your health care provider advises. You may need to stay home longer to avoid infecting others. You can also use a face mask and careful hand washing to prevent spread of the virus.  Increase the usage of your inhaler if you have asthma.   Do not use any tobacco products, including cigarettes, chewing tobacco, or electronic cigarettes. If you need help quitting, ask your health care provider. PREVENTION  The best way to protect yourself from getting a cold is to practice good hygiene.   Avoid oral or hand contact with people with cold   symptoms.   Wash your hands often if contact occurs.  There is no clear evidence that vitamin C, vitamin E, echinacea, or exercise reduces the chance of developing a cold. However, it is always recommended to get plenty of rest, exercise, and practice good nutrition.  SEEK MEDICAL CARE IF:   You are getting worse rather than better.   Your symptoms are not controlled by medicine.   You have chills.  You have worsening shortness of breath.  You have brown or red mucus.  You have yellow or brown nasal  discharge.  You have pain in your face, especially when you bend forward.  You have a fever.  You have swollen neck glands.  You have pain while swallowing.  You have white areas in the back of your throat. SEEK IMMEDIATE MEDICAL CARE IF:   You have severe or persistent:  Headache.  Ear pain.  Sinus pain.  Chest pain.  You have chronic lung disease and any of the following:  Wheezing.  Prolonged cough.  Coughing up blood.  A change in your usual mucus.  You have a stiff neck.  You have changes in your:  Vision.  Hearing.  Thinking.  Mood. MAKE SURE YOU:   Understand these instructions.  Will watch your condition.  Will get help right away if you are not doing well or get worse.   This information is not intended to replace advice given to you by your health care provider. Make sure you discuss any questions you have with your health care provider.   Document Released: 06/04/2001 Document Revised: 04/25/2015 Document Reviewed: 03/16/2014 Elsevier Interactive Patient Education 2016 Elsevier Inc.  

## 2016-03-05 NOTE — Progress Notes (Signed)
Patient: Joel Burgess Male    DOB: 01-15-1976   40 y.o.   MRN: [id number] Visit Date: 03/05/2016  Today's Provider: Lelon Huh, MD   Chief Complaint  Patient presents with  . Sore Throat   Subjective:    Sore Throat  This is a new problem. Episode onset: 2 days ago. The problem has been gradually worsening. The pain is worse on the right side. Maximum temperature: over 99. The fever has been present for 1 to 2 days. The pain is at a severity of 5/10. The pain is moderate. Associated symptoms include swollen glands and trouble swallowing. Pertinent negatives include no abdominal pain, congestion, coughing, diarrhea, drooling, ear discharge, ear pain, headaches, hoarse voice, plugged ear sensation, neck pain, shortness of breath, stridor or vomiting. He has had no exposure to strep or mono. He has tried nothing for the symptoms.   Sore throat started Sunday 03/03/2016. Has symptoms of elevated temperature, sweats, body aches and trouble swallowing. Some chest congestion.    Allergies  Allergen Reactions  . Penicillins Swelling   Previous Medications   ALBUTEROL (PROVENTIL HFA;VENTOLIN HFA) 108 (90 BASE) MCG/ACT INHALER    Inhale 2 puffs into the lungs every 6 (six) hours as needed for wheezing.   ALPRAZOLAM (XANAX) 1 MG TABLET    TAKE ONE TABLET BY MOUTH TWICE DAILY AS NEEDED FOR ANXIETY   AMPHETAMINE-DEXTROAMPHETAMINE (ADDERALL) 30 MG TABLET    Take 1 tablet by mouth 2 (two) times daily.   DIAZEPAM (VALIUM) 10 MG TABLET    Take 1 tablet (10 mg total) by mouth daily as needed (for musce spasm).    Review of Systems  Constitutional: Negative for fever, chills and appetite change.  HENT: Positive for sore throat and trouble swallowing. Negative for congestion, drooling, ear discharge, ear pain and hoarse voice.   Respiratory: Negative for cough, chest tightness, shortness of breath, wheezing and stridor.   Cardiovascular: Negative for chest pain and palpitations.    Gastrointestinal: Negative for nausea, vomiting, abdominal pain and diarrhea.  Musculoskeletal: Negative for neck pain.  Neurological: Negative for headaches.    Social History  Substance Use Topics  . Smoking status: Never Smoker   . Smokeless tobacco: Never Used  . Alcohol Use: No   Objective:   BP 110/74 mmHg  Pulse 71  Temp(Src) 98.8 F (37.1 C) (Oral)  Resp 16  Wt 169 lb (76.658 kg)  SpO2 99%  Physical Exam  General Appearance:    Alert, cooperative, no distress  HENT:   bilateral TM normal without fluid or infection, neck without nodes, pharynx erythematous without exudate, sinuses nontender and nasal mucosa congested  Eyes:    PERRL, conjunctiva/corneas clear, EOM's intact       Lungs:     Clear to auscultation bilaterally, respirations unlabored  Heart:    Regular rate and rhythm  Neurologic:   Awake, alert, oriented x 3. No apparent focal neurological           defect.       Results for orders placed or performed in visit on 03/05/16  POCT Influenza A/B  Result Value Ref Range   Influenza A, POC Negative Negative   Influenza B, POC Negative Negative  POCT rapid strep A  Result Value Ref Range   Rapid Strep A Screen Negative Negative        Assessment & Plan:     1. Upper respiratory infection Counseled regarding signs and symptoms of viral and  bacterial respiratory infections. Advised to call or return for additional evaluation if he develops any sign of bacterial infection, or if current symptoms last longer than 10 days.    2. Sore throat Recommend saltwater gargles.  - POCT rapid strep A  3. Body aches Can continue Tylenol or ibuprofen per label.  - POCT Influenza A/B  4. Reactive airway disease, unspecified asthma severity, uncomplicated Needs refill albuterol.  - albuterol (PROVENTIL HFA;VENTOLIN HFA) 108 (90 Base) MCG/ACT inhaler; Inhale 2 puffs into the lungs every 6 (six) hours as needed for wheezing.  Dispense: 1 Inhaler; Refill: 1        Lelon Huh, MD  [facility]

## 2016-04-12 ENCOUNTER — Other Ambulatory Visit: Payer: Self-pay | Admitting: Family Medicine

## 2016-04-12 NOTE — Telephone Encounter (Signed)
Please call in diazepam.  

## 2016-04-12 NOTE — Telephone Encounter (Signed)
Rx called in to pharmacy. 

## 2016-05-14 ENCOUNTER — Encounter: Payer: Self-pay | Admitting: Psychiatry

## 2016-05-14 ENCOUNTER — Ambulatory Visit (INDEPENDENT_AMBULATORY_CARE_PROVIDER_SITE_OTHER): Payer: Managed Care, Other (non HMO) | Admitting: Psychiatry

## 2016-05-14 VITALS — BP 128/78 | HR 73 | Temp 97.6°F | Ht 66.0 in | Wt 163.6 lb

## 2016-05-14 DIAGNOSIS — F902 Attention-deficit hyperactivity disorder, combined type: Secondary | ICD-10-CM

## 2016-05-14 MED ORDER — AMPHETAMINE-DEXTROAMPHETAMINE 30 MG PO TABS: 30.0000 mg | ORAL_TABLET | Freq: Two times a day (BID) | ORAL | Status: DC

## 2016-05-14 MED ORDER — AMPHETAMINE-DEXTROAMPHETAMINE 30 MG PO TABS
30.0000 mg | ORAL_TABLET | Freq: Two times a day (BID) | ORAL | Status: DC
Start: 2016-05-14 — End: 2016-12-02

## 2016-05-27 ENCOUNTER — Telehealth: Payer: Self-pay | Admitting: Family Medicine

## 2016-05-27 MED ORDER — ALPRAZOLAM 1 MG PO TABS: 1.0000 mg | ORAL_TABLET | Freq: Two times a day (BID) | ORAL | Status: DC | PRN

## 2016-05-27 NOTE — Telephone Encounter (Signed)
Please call in alprazolam.  

## 2016-05-27 NOTE — Telephone Encounter (Signed)
Pt states he is taking 2 pills a day for the ALPRAZolam (XANAX) 1 MG tablet, 1 every 12 hours.  Pt is requesting a new Rx sent for 60 pills a month.  [facility]  CB#[id number]/MW

## 2016-05-27 NOTE — Telephone Encounter (Signed)
Pease advise.

## 2016-05-27 NOTE — Telephone Encounter (Signed)
Rx called in to pharmacy. 

## 2016-06-13 ENCOUNTER — Other Ambulatory Visit: Payer: Self-pay | Admitting: Family Medicine

## 2016-07-29 ENCOUNTER — Encounter: Payer: Self-pay | Admitting: Family Medicine

## 2016-07-29 ENCOUNTER — Ambulatory Visit (INDEPENDENT_AMBULATORY_CARE_PROVIDER_SITE_OTHER): Payer: Managed Care, Other (non HMO) | Admitting: Family Medicine

## 2016-07-29 VITALS — BP 136/84 | HR 84 | Temp 98.9°F | Resp 16 | Wt 158.0 lb

## 2016-07-29 DIAGNOSIS — J0100 Acute maxillary sinusitis, unspecified: Secondary | ICD-10-CM

## 2016-07-29 MED ORDER — AZITHROMYCIN 250 MG PO TABS: ORAL_TABLET | ORAL | 0 refills | Status: AC

## 2016-07-29 NOTE — Progress Notes (Signed)
Patient: Joel Burgess Male    DOB: 10-Dec-1976   40 y.o.   MRN: [id number] Visit Date: 07/29/2016  Today's Provider: Lelon Huh, MD   Chief Complaint  Patient presents with  . Sore Throat  . Sinusitis  . Cough   Subjective:    Sore Throat   This is a new problem. The current episode started in the past 7 days (Started Saturday). The problem has been gradually worsening. The pain is worse on the right side. Associated symptoms include congestion, coughing, headaches and shortness of breath. Pertinent negatives include no ear discharge, ear pain, stridor or trouble swallowing. The treatment provided no relief.  Sinusitis  This is a new problem. The current episode started in the past 7 days. The problem has been gradually worsening since onset. Associated symptoms include congestion, coughing, diaphoresis, headaches, shortness of breath, sinus pressure and a sore throat. Pertinent negatives include no ear pain or sneezing. Past treatments include oral decongestants. The treatment provided no relief.  Cough  This is a new problem. The current episode started in the past 7 days. The problem has been gradually worsening. Associated symptoms include a fever, headaches, postnasal drip, rhinorrhea, a sore throat and shortness of breath. Pertinent negatives include no ear pain or wheezing. The treatment provided no relief. His past medical history is significant for asthma.   Has taken Mucnex and Sudofedwhich didn't help.      Allergies  Allergen Reactions  . Penicillins Swelling   Current Meds  Medication Sig  . ALPRAZolam (XANAX) 1 MG tablet Take 1 tablet (1 mg total) by mouth 2 (two) times daily as needed for anxiety. for anxiety  . amphetamine-dextroamphetamine (ADDERALL) 30 MG tablet Take 1 tablet by mouth 2 (two) times daily.  . diazepam (VALIUM) 10 MG tablet TAKE ONE TABLET BY MOUTH EVERY DAY AS NEEDED (FOR MUSCLE SPASM)  . PROAIR HFA 108 (90 Base) MCG/ACT inhaler INHALE  TWO PUFFS INTO THE LUNGS EVERY 6 HOURS AS NEEDED FOR WHEEZING    Review of Systems  Constitutional: Positive for appetite change, diaphoresis, fatigue and fever. Negative for activity change.  HENT: Positive for congestion, postnasal drip, rhinorrhea, sinus pressure and sore throat. Negative for ear discharge, ear pain, sneezing, tinnitus and trouble swallowing.   Respiratory: Positive for cough, chest tightness and shortness of breath. Negative for apnea, choking, wheezing and stridor.        Pt's reports his inhaler is helping some.   Cardiovascular: Negative.   Gastrointestinal: Negative.   Neurological: Positive for light-headedness and headaches. Negative for dizziness.    Social History  Substance Use Topics  . Smoking status: Never Smoker  . Smokeless tobacco: Never Used  . Alcohol use No   Objective:   BP 136/84 (BP Location: Left Arm, Patient Position: Sitting, Cuff Size: Normal)   Pulse 84   Temp 98.9 F (37.2 C) (Oral)   Resp 16   Wt 158 lb (71.7 kg)   SpO2 98%   BMI 25.50 kg/m   Physical Exam  General Appearance:    Alert, cooperative, no distress  HENT:   bilateral TM normal without fluid or infection, neck without nodes, tonsils red, enlarged, with exudate present, maxillary  sinuses tender and nasal mucosa pale and congested  Eyes:    PERRL, conjunctiva/corneas clear, EOM's intact       Lungs:     Clear to auscultation bilaterally, respirations unlabored  Heart:    Regular rate and rhythm  Neurologic:  Awake, alert, oriented x 3. No apparent focal neurological           defect.           Assessment & Plan:     1. Acute maxillary sinusitis, recurrence not specified Rx Zpack Call if not improving by end of week, or if sx change or worsen     The entirety of the information documented in the History of Present Illness, Review of Systems and Physical Exam were personally obtained by me. Portions of this information were initially documented by Ashley Royalty, CMA and reviewed by me for thoroughness and accuracy.    Lelon Huh, MD  [facility]

## 2016-07-29 NOTE — Patient Instructions (Signed)

## 2016-07-30 ENCOUNTER — Telehealth: Payer: Self-pay | Admitting: Family Medicine

## 2016-07-30 NOTE — Telephone Encounter (Signed)
Pt states he has had diarrhea today and has not been able to go to work.  Pt is requesting a work note for today 07/30/2016.  Pt states he will need to pick this up tomorrow.  CB#[phone]/MW

## 2016-07-30 NOTE — Telephone Encounter (Signed)
Patient stated that diarrhea started this afternoon. He believes its from antibiotic. Patient is requesting a note to excuse him from work last night and tonight. Please advise?

## 2016-07-31 ENCOUNTER — Encounter: Payer: Self-pay | Admitting: *Deleted

## 2016-07-31 NOTE — Telephone Encounter (Signed)
Patient notified

## 2016-07-31 NOTE — Telephone Encounter (Signed)
OK for work excuse last night and tonight. She should take OTC pro-biotics to minimized side effects from aztihromycin

## 2016-07-31 NOTE — Telephone Encounter (Signed)
Letter printed and ready to sign.  

## 2016-08-04 ENCOUNTER — Encounter: Payer: Self-pay | Admitting: Emergency Medicine

## 2016-08-04 ENCOUNTER — Emergency Department: Payer: Managed Care, Other (non HMO)

## 2016-08-04 ENCOUNTER — Emergency Department
Admission: EM | Admit: 2016-08-04 | Discharge: 2016-08-04 | Payer: Managed Care, Other (non HMO) | Attending: Emergency Medicine | Admitting: Emergency Medicine

## 2016-08-04 DIAGNOSIS — S99911A Unspecified injury of right ankle, initial encounter: Secondary | ICD-10-CM | POA: Diagnosis present

## 2016-08-04 DIAGNOSIS — X501XXA Overexertion from prolonged static or awkward postures, initial encounter: Secondary | ICD-10-CM | POA: Diagnosis not present

## 2016-08-04 DIAGNOSIS — S82241A Displaced spiral fracture of shaft of right tibia, initial encounter for closed fracture: Secondary | ICD-10-CM | POA: Insufficient documentation

## 2016-08-04 DIAGNOSIS — S82401A Unspecified fracture of shaft of right fibula, initial encounter for closed fracture: Secondary | ICD-10-CM | POA: Insufficient documentation

## 2016-08-04 DIAGNOSIS — T1490XA Injury, unspecified, initial encounter: Secondary | ICD-10-CM

## 2016-08-04 DIAGNOSIS — S82201A Unspecified fracture of shaft of right tibia, initial encounter for closed fracture: Secondary | ICD-10-CM | POA: Diagnosis not present

## 2016-08-04 DIAGNOSIS — Y929 Unspecified place or not applicable: Secondary | ICD-10-CM | POA: Insufficient documentation

## 2016-08-04 DIAGNOSIS — Y939 Activity, unspecified: Secondary | ICD-10-CM | POA: Diagnosis not present

## 2016-08-04 DIAGNOSIS — Y999 Unspecified external cause status: Secondary | ICD-10-CM | POA: Diagnosis not present

## 2016-08-04 DIAGNOSIS — F909 Attention-deficit hyperactivity disorder, unspecified type: Secondary | ICD-10-CM | POA: Diagnosis not present

## 2016-08-04 DIAGNOSIS — S82831A Other fracture of upper and lower end of right fibula, initial encounter for closed fracture: Secondary | ICD-10-CM

## 2016-08-04 HISTORY — DX: Displaced spiral fracture of shaft of right tibia, initial encounter for closed fracture: S82.241A

## 2016-08-04 MED ORDER — HYDROMORPHONE HCL 1 MG/ML IJ SOLN
1.0000 mg | Freq: Once | INTRAMUSCULAR | Status: AC
  Administered 2016-08-04: 1 mg via INTRAVENOUS
  Filled 2016-08-04: qty 1

## 2016-08-04 MED ORDER — ONDANSETRON HCL 4 MG/2ML IJ SOLN
4.0000 mg | Freq: Once | INTRAMUSCULAR | Status: AC
  Administered 2016-08-04: 4 mg via INTRAVENOUS
  Filled 2016-08-04: qty 2

## 2016-08-04 MED ORDER — HYDROMORPHONE HCL 1 MG/ML IJ SOLN
1.0000 mg | Freq: Once | INTRAMUSCULAR | Status: AC
  Administered 2016-08-04: 1 mg via INTRAVENOUS

## 2016-08-04 NOTE — ED Notes (Signed)
Pt D/C from [facility] ED into the care of [facility]. [facility] to transfer patient to [facility]. Pt noted to be in stable condition at this time.

## 2016-08-04 NOTE — ED Notes (Signed)
Disregard note of bed ready

## 2016-08-04 NOTE — ED Provider Notes (Signed)
[facility] Emergency Department Provider Note ____________________________________________   I have reviewed the triage vital signs and the triage nursing note.  HISTORY  Chief Complaint Leg Pain   Historian Patient  HPI Joel Burgess is a 40 y.o. male here for evaluation of traumatic right ankle pain. 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 He "saw the bone sticking out ", but there is no skin deficit or laceration. He is in an EMS splint. He is continuing to complain of pain at the right lower tibia/ankle. He can wiggle his toes.  Denies any other injury. Pain at the ankle is moderate to severe, moving makes it worse.    Past Medical History:  Diagnosis Date  . ADHD (attention deficit hyperactivity disorder)   . Anxiety     Patient Active Problem List   Diagnosis Date Noted  . Anxiety, generalized 03/31/2015  . ADD (attention deficit disorder) 03/31/2015    Past Surgical History:  Procedure Laterality Date  . APPENDECTOMY      Prior to Admission medications   Medication Sig Start Date End Date Taking? Authorizing Provider  ALPRAZolam Duanne Moron) 1 MG tablet Take 1 tablet (1 mg total) by mouth 2 (two) times daily as needed for anxiety. for anxiety 05/27/16  Yes Birdie Sons, MD  amphetamine-dextroamphetamine (ADDERALL) 30 MG tablet Take 1 tablet by mouth 2 (two) times daily. 05/14/16  Yes Gonzella Lex, MD  diazepam (VALIUM) 10 MG tablet TAKE ONE TABLET BY MOUTH EVERY DAY AS NEEDED (FOR MUSCLE SPASM) 04/12/16  Yes Birdie Sons, MD  PROAIR HFA [phone] Base) MCG/ACT inhaler INHALE TWO PUFFS INTO THE LUNGS EVERY 6 HOURS AS NEEDED FOR WHEEZING 06/14/16  Yes Birdie Sons, MD  amphetamine-dextroamphetamine (ADDERALL) 30 MG tablet Take 1 tablet by mouth 2 (two) times daily. 05/14/16   Gonzella Lex, MD  amphetamine-dextroamphetamine (ADDERALL) 30 MG tablet Take 1 tablet by mouth 2 (two) times  daily. 05/14/16   Gonzella Lex, MD    Allergies  Allergen Reactions  . Penicillins Anaphylaxis    Has patient had a PCN reaction causing immediate rash, facial/tongue/throat swelling, SOB or lightheadedness with hypotension: Yes Has patient had a PCN reaction causing severe rash involving mucus membranes or skin necrosis: No Has patient had a PCN reaction that required hospitalization pt not sure Has patient had a PCN reaction occurring within the last 10 years: No If all of the above answers are "NO", then may proceed with Cephalosporin use.    Family History  Problem Relation Age of Onset  . Anxiety disorder Mother   . Depression Mother   . Hypertension Mother   . Anxiety disorder Sister   . ADD / ADHD Sister   . Bipolar disorder Sister   . Hypertension Father   . Depression Father   . Bipolar disorder Maternal Uncle   . Schizophrenia Maternal Uncle     Social History Social History  Substance Use Topics  . Smoking status: Never Smoker  . Smokeless tobacco: Never Used  . Alcohol use No    Review of Systems  Constitutional: Negative for Recent illnesses. Eyes: Negative for trauma to the eyes. ENT: Negative for trauma to the face. Cardiovascular: Negative for chest pain. Respiratory: Negative for shortness of breath. Gastrointestinal: Negative for abdominal pain. Genitourinary:  Musculoskeletal: Negative for back pain. Skin: Negative for rash. Neurological: Negative for headache. 10 point Review of Systems otherwise negative  ____________________________________________   PHYSICAL EXAM:  VITAL SIGNS: ED Triage Vitals  Enc Vitals Group     BP      Pulse      Resp      Temp      Temp src      SpO2      Weight      Height      Head Circumference      Peak Flow      Pain Score      Pain Loc      Pain Edu?      Excl. in [place]?      Constitutional: Alert and oriented. Well appearing Overall been having significant pain to his right ankle.Marland Kitchen HEENT    Head: Normocephalic and atraumatic.      Eyes: Conjunctivae are normal. PERRL. Normal extraocular movements.      Ears:         Nose: No congestion/rhinnorhea.   Mouth/Throat: Mucous membranes are moist.   Neck: No stridor. Cardiovascular/Chest: Normal rate, regular rhythm.  No murmurs, rubs, or gallops. Respiratory: Normal respiratory effort without tachypnea nor retractions. Breath sounds are clear and equal bilaterally. No wheezes/rales/rhonchi. Gastrointestinal: Soft. No distention, no guarding, no rebound. Nontender.    Genitourinary/rectal:Deferred Musculoskeletal: Swelling/deformity medial right distal tibia and ankle. Tenderness to palpation at the right distal tibia and ankle. Strong dorsalis pedis pulse. Able to move his toes. Neurologic:  Normal speech and language. No gross or focal neurologic deficits are appreciated. Skin:  Skin is warm, dry and intact. No rash noted. Psychiatric: Mood and affect are normal. Speech and behavior are normal. Patient exhibits appropriate insight and judgment.  ____________________________________________   EKG I, Lisa Roca, MD, the attending physician have personally viewed and interpreted all ECGs.  None ____________________________________________  LABS (pertinent positives/negatives)  Labs Reviewed - No data to display  ____________________________________________  RADIOLOGY All Xrays were viewed by me. Imaging interpreted by Radiologist.  Right tib fib x-ray complete:  FINDINGS: Osseous mineralization grossly normal.  Oblique minimally displaced fracture of the lateral malleolus.  Oblique fracture of the mid to distal RIGHT tibial diaphysis with mild anterior and medial displacement.  Additional nondisplaced fracture planes extend down the diaphysis of the distal fragment without definite intra-articular extension.  No additional fracture, dislocation, or bone destruction.  Knee and ankle joint alignments  normal.  IMPRESSION: Displaced oblique fracture RIGHT lateral malleolus.  Displaced diaphyseal fracture mid to distal RIGHT tibia as above. __________________________________________  PROCEDURES  Procedure(s) performed: SPLINT APPLICATION Date/Time: [id number] PM Authorized by: Lisa Roca Consent: Verbal consent obtained. Risks and benefits: risks, benefits and alternatives were discussed Consent given by: patient Splint applied by: orthopedic technician Location details: right leg Splint type: short leg posterior Supplies used: splint and ace wrap Post-procedure: The splinted body part was neurovascularly unchanged following the procedure. Patient tolerance: Patient tolerated the procedure well with no immediate complications.     Critical Care performed: None  ____________________________________________   ED COURSE / ASSESSMENT AND PLAN  Pertinent labs & imaging results that were available during my care of the patient were reviewed by me and considered in my medical decision making (see chart for details).   This patient has traumatic injury of the right ankle, provided IV pain control here in the emergency department.  He is neurovascularly intact. Imaging shows distal tibia oblique fracture closed but with some displacement and distal fibula fx.  Patient refused splinting when I first ordered it stating that he wanted surgery  and wanted to talk with myself before consenting to splinting.  Patient was stating that he never wanted to come to [facility] the first place, and that his family has had a bad experience at [facility] and that he would prefer to go to [facility].  He states that EMS called ahead to find out if we had the capability take care of his injury, and the charge nurse had indicated that we have orthopedics on-call today.  Myself and charge nurse Carleene Overlie did let the patient know that the charge nurse did receive a call and it is true that  we have orthopedic coverage capable of taking care of his injury here today.  The patient became more calm after that and was agreeable to be admitted tonight for surgical repair tomorrow with Dr. Sabra Heck.  When patient's family member, wife, came to the the emergency department, the patient and her discussed and changed her mind that they would like to be transferred to [facility] due to personal preference.  Apparently a family member has had a bad experience at [facility], and they would prefer to be treated at [facility].  I contacted to transfer center for patient preference transfer for tibia fracture, and spoke with on-call orthopedic physician Dr. Marlou Starks, who accepted patient in transfer.      CONSULTATIONS:   Orthopedist Dr. Sabra Heck, reviewed clinical history by phone viewed the x-ray and recommends surgery surgical repair with rod placement.  Dr. Marlou Starks, orthopedics at [facility], except as in transfer, patient to go to the ED by [place] ground transport.   Patient / Family / Caregiver informed of clinical course, medical decision-making process, and agree with plan.    ___________________________________________   FINAL CLINICAL IMPRESSION(S) / ED DIAGNOSES   Final diagnoses:  Closed fracture of tibia, shaft, right, initial encounter  Traumatic closed nondisplaced fracture of distal fibula, right, initial encounter              Note: This dictation was prepared with Dragon dictation. Any transcriptional errors that result from this process are unintentional    Lisa Roca, MD 08/04/16 1215

## 2016-08-04 NOTE — ED Notes (Signed)
Joel Burgess spoke to [place]

## 2016-08-04 NOTE — ED Notes (Signed)
This RN to bedside to introduce self to patient and SO. Pt requests something to drink, this RN explained to patient that due to pending evaluation from orthopedics at [facility], the patient could not have anything to eat or drink. Tiffany, RN at bedside as well at this time. Pt states, "we already know what kind of service we are going to get here". Pt's SO states, "I'm sure they will give him a sip of water before surgery, so you're not going to give him any water?" This RN reiterated to patient regarding patient safety at this time.

## 2016-08-04 NOTE — ED Notes (Signed)
RN and ED staff member Sherlie Ban went in patient room to place short leg splint on patients leg. Patient refused to let staff put splint on his leg stating that he wanted his leg fixed today and did not want to wait. Patient stated that he could go to [facility] and have his leg fixed today.  Dr. Reita Cliche notified of refusal of patient to let staff apply splint.

## 2016-08-04 NOTE — ED Notes (Signed)
Patient given 100 mg of Fentanyl by EMS about 10 minutes PTA
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
## 2016-08-04 NOTE — ED Notes (Signed)
Informed RN bed ready  1041

## 2016-08-12 ENCOUNTER — Encounter: Payer: Managed Care, Other (non HMO) | Admitting: Psychiatry

## 2016-08-20 DIAGNOSIS — S8261XA Displaced fracture of lateral malleolus of right fibula, initial encounter for closed fracture: Secondary | ICD-10-CM | POA: Insufficient documentation

## 2016-08-20 HISTORY — DX: Displaced fracture of lateral malleolus of right fibula, initial encounter for closed fracture: S82.61XA

## 2016-09-02 ENCOUNTER — Ambulatory Visit: Payer: Self-pay | Admitting: Psychiatry

## 2016-09-03 ENCOUNTER — Ambulatory Visit (INDEPENDENT_AMBULATORY_CARE_PROVIDER_SITE_OTHER): Payer: Managed Care, Other (non HMO) | Admitting: Psychiatry

## 2016-09-03 DIAGNOSIS — F902 Attention-deficit hyperactivity disorder, combined type: Secondary | ICD-10-CM | POA: Diagnosis not present

## 2016-09-03 DIAGNOSIS — F411 Generalized anxiety disorder: Secondary | ICD-10-CM | POA: Diagnosis not present

## 2016-09-03 MED ORDER — AMPHETAMINE-DEXTROAMPHETAMINE 30 MG PO TABS: 30.0000 mg | ORAL_TABLET | Freq: Two times a day (BID) | ORAL | 0 refills | Status: DC

## 2016-09-03 NOTE — Progress Notes (Signed)
Follow-up for this patient with ADHD. He has no complaints. Mood has been stable. Says that he sleeps fine at night. Denies suicidal ideation. Denies any psychotic symptoms. He is sleeping well. Appetite is fine. No medical complaints.  He is walking with a cast because he broke his leg. Says that he is out of work for a few months because of that. Seems to be getting along okay with his family. No sign that there is any current violence at home. We reviewed his relationship with his son. He was disappointed that his son did not get sent to a long-term psychiatric facility although it's not clear to me that there is any specific indication for it since the son appears to be functioning well at home. No other change to medicine. Continue Adderall.

## 2016-09-06 ENCOUNTER — Other Ambulatory Visit: Payer: Self-pay

## 2016-09-06 MED ORDER — DIAZEPAM 10 MG PO TABS: ORAL_TABLET | ORAL | 4 refills | Status: DC

## 2016-09-06 NOTE — Telephone Encounter (Signed)
Pharmacy sent fax requesting refill.

## 2016-09-06 NOTE — Telephone Encounter (Signed)
Please call in diazepam.  

## 2016-11-28 ENCOUNTER — Telehealth: Payer: Self-pay

## 2016-11-28 NOTE — Telephone Encounter (Signed)
Patient called the office requesting that Dr. Caryn Section write a note for him to return to work with restrictions. Patient had a Tibia fracture 07/2016 that required surgery. Patient states he had been following up with the Orthopedics since the surgery and they cleared him to return to work on Monday. He went to work on Monday 11/25/2016 and the next day started having swelling in his leg. He took the next couple of days off and the swelling resolved. Patient states he is ready to return to work but needs restrictions. I advised patient that the restrictions would have to come from the Orthopedics and he should contact them. Patient agreed to contact them for the restriction note. Patient has scheduled an appointment to see Dr. Caryn Section, to inform him all what has been going on these past few months. Appointment scheduled 12/02/2016 at 3pm.

## 2016-11-30 ENCOUNTER — Other Ambulatory Visit: Payer: Self-pay | Admitting: Family Medicine

## 2016-12-01 NOTE — Telephone Encounter (Signed)
Please call in alprazolam.  

## 2016-12-02 ENCOUNTER — Encounter: Payer: Self-pay | Admitting: Family Medicine

## 2016-12-02 ENCOUNTER — Ambulatory Visit (INDEPENDENT_AMBULATORY_CARE_PROVIDER_SITE_OTHER): Payer: Managed Care, Other (non HMO) | Admitting: Family Medicine

## 2016-12-02 VITALS — BP 130/82 | HR 72 | Temp 98.5°F | Resp 16 | Wt 171.0 lb

## 2016-12-02 DIAGNOSIS — S82244A Nondisplaced spiral fracture of shaft of right tibia, initial encounter for closed fracture: Secondary | ICD-10-CM | POA: Diagnosis not present

## 2016-12-02 DIAGNOSIS — S82891A Other fracture of right lower leg, initial encounter for closed fracture: Secondary | ICD-10-CM

## 2016-12-02 NOTE — Telephone Encounter (Signed)
Rx called in to pharmacy. 

## 2016-12-02 NOTE — Progress Notes (Signed)
Patient: Joel Burgess Male    DOB: 1976/11/01   40 y.o.   MRN: [id number] Visit Date: 12/02/2016  Today's Provider: Lelon Huh, MD   Chief Complaint  Patient presents with  . Leg Injury   Subjective:    HPI Patient comes in today to discuss his right leg spiral  Right lateral malleolar and right tibial shaft fracture. He reports that he fractured his leg on 08/04/2016. He reports that he went back to work on 11/25/2016. 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 He states his leg starts hurting and swelling causing him to limp after about 3 hours.   He is scheduled to follow up with orthopedist at [facility] In February, 2018. He had been walking with a cane prior to returning to work, but did not use when he returned to work  He works on Oncologist at [employer] and states he has to do a lot of walking on the lot to retrieve vehicles, which is when leg starts to hurt.     Allergies  Allergen Reactions  . Penicillins Anaphylaxis    Has patient had a PCN reaction causing immediate rash, facial/tongue/throat swelling, SOB or lightheadedness with hypotension: Yes Has patient had a PCN reaction causing severe rash involving mucus membranes or skin necrosis: No Has patient had a PCN reaction that required hospitalization pt not sure Has patient had a PCN reaction occurring within the last 10 years: No If all of the above answers are "NO", then may proceed with Cephalosporin use.     Current Outpatient Prescriptions:  .  ALPRAZolam (XANAX) 1 MG tablet, TAKE ONE TABLET BY MOUTH TWICE DAILY AS NEEDED FOR ANXIETY, Disp: 60 tablet, Rfl: 3 .  amphetamine-dextroamphetamine (ADDERALL) 30 MG tablet, Take 1 tablet by mouth 2 (two) times daily., Disp: 60 tablet, Rfl: 0 .  diazepam (VALIUM) 10 MG tablet, TAKE ONE TABLET BY MOUTH EVERY DAY AS NEEDED (FOR MUSCLE SPASM), Disp: 30 tablet, Rfl: 4 .  PROAIR HFA 108 (90 Base) MCG/ACT inhaler, INHALE TWO PUFFS INTO THE LUNGS EVERY 6 HOURS  AS NEEDED FOR WHEEZING, Disp: 8.5 g, Rfl: 5 .  amphetamine-dextroamphetamine (ADDERALL) 30 MG tablet, Take 1 tablet by mouth 2 (two) times daily., Disp: 60 tablet, Rfl: 0 .  amphetamine-dextroamphetamine (ADDERALL) 30 MG tablet, Take 1 tablet by mouth 2 (two) times daily., Disp: 60 tablet, Rfl: 0  Review of Systems  Constitutional: Negative.   Respiratory: Negative.   Musculoskeletal: Positive for arthralgias, joint swelling and myalgias.  Neurological: Negative.     Social History  Substance Use Topics  . Smoking status: Never Smoker  . Smokeless tobacco: Never Used  . Alcohol use No   Objective:   BP 130/82 (BP Location: Left Arm, Patient Position: Sitting, Cuff Size: Normal)   Pulse 72   Temp 98.5 F (36.9 C)   Resp 16   Wt 171 lb (77.6 kg)   BMI 26.00 kg/m   Physical Exam  General appearance: alert, well developed, well nourished, cooperative and in no distress Head: Normocephalic, without obvious abnormality, atraumatic Respiratory: Respirations even and unlabored, normal respiratory rate Extremities: Slight tenderness right lateral malleolus and anterior tibia. Adderall     Assessment & Plan:     1. Closed nondisplaced spiral fracture of shaft of right tibia, initial encounter   2. Closed fracture of malleolus of right ankle, initial encounter  Is still recuperating from returning to work last week. Counseled he  should use can to reduce weight bearing when ambulating. He will to return 4 hours a day starting 12/14 and anticipate 8 hours a day on 12-17-2016. He is to call if he does no tolerate this schedule.        Lelon Huh, MD  [facility]

## 2016-12-03 ENCOUNTER — Ambulatory Visit (INDEPENDENT_AMBULATORY_CARE_PROVIDER_SITE_OTHER): Payer: Managed Care, Other (non HMO) | Admitting: Psychiatry

## 2016-12-03 ENCOUNTER — Encounter: Payer: Self-pay | Admitting: Psychiatry

## 2016-12-03 VITALS — BP 135/77 | HR 76 | Temp 98.0°F | Wt 175.6 lb

## 2016-12-03 DIAGNOSIS — F902 Attention-deficit hyperactivity disorder, combined type: Secondary | ICD-10-CM | POA: Diagnosis not present

## 2016-12-03 DIAGNOSIS — F411 Generalized anxiety disorder: Secondary | ICD-10-CM | POA: Diagnosis not present

## 2016-12-03 MED ORDER — AMPHETAMINE-DEXTROAMPHETAMINE 30 MG PO TABS
30.0000 mg | ORAL_TABLET | Freq: Two times a day (BID) | ORAL | 0 refills | Status: DC
Start: 2016-12-03 — End: 2017-05-27

## 2016-12-03 MED ORDER — AMPHETAMINE-DEXTROAMPHETAMINE 30 MG PO TABS: 30.0000 mg | ORAL_TABLET | Freq: Two times a day (BID) | ORAL | 0 refills | Status: DC

## 2016-12-03 NOTE — Progress Notes (Signed)
Follow-up for 40 year old man with ADHD. No new complaints. Adderall continues to be effective. Mood is been stable. No problems with anger outbursts irritability or depression.  Casually dressed. Good eye contact. Appropriate interaction. Affect euthymic. No evidence of psychosis. No suicidal or homicidal ideation. Good judgment and insight.  No change to medicine. 3 months worth of Adderall prescriptions provided. Follow-up in 3 months.

## 2016-12-04 ENCOUNTER — Encounter: Payer: Self-pay | Admitting: Family Medicine

## 2016-12-07 NOTE — Progress Notes (Signed)
No new complaint. Attention and focus adequate. No abuse of medicine. Mood stable.  Neatly dressed. Good eye contact. No change to psychomotor activity. No sign of psychosis.  We need to the Adderall continue current medicine and reviewed treatment goals follow-up 3 months.

## 2017-02-24 ENCOUNTER — Ambulatory Visit (INDEPENDENT_AMBULATORY_CARE_PROVIDER_SITE_OTHER): Payer: Managed Care, Other (non HMO) | Admitting: Psychiatry

## 2017-02-24 ENCOUNTER — Encounter: Payer: Self-pay | Admitting: Family Medicine

## 2017-02-24 ENCOUNTER — Ambulatory Visit (INDEPENDENT_AMBULATORY_CARE_PROVIDER_SITE_OTHER): Payer: Managed Care, Other (non HMO) | Admitting: Family Medicine

## 2017-02-24 ENCOUNTER — Encounter: Payer: Self-pay | Admitting: Psychiatry

## 2017-02-24 VITALS — BP 122/62 | HR 68 | Temp 98.9°F | Resp 16 | Ht 68.0 in | Wt 178.0 lb

## 2017-02-24 VITALS — BP 117/76 | HR 69 | Temp 99.1°F | Wt 177.2 lb

## 2017-02-24 DIAGNOSIS — Z0000 Encounter for general adult medical examination without abnormal findings: Secondary | ICD-10-CM | POA: Diagnosis not present

## 2017-02-24 DIAGNOSIS — F411 Generalized anxiety disorder: Secondary | ICD-10-CM | POA: Diagnosis not present

## 2017-02-24 DIAGNOSIS — F902 Attention-deficit hyperactivity disorder, combined type: Secondary | ICD-10-CM

## 2017-02-24 MED ORDER — AMPHETAMINE-DEXTROAMPHETAMINE 30 MG PO TABS: 30.0000 mg | ORAL_TABLET | Freq: Two times a day (BID) | ORAL | 0 refills | Status: DC

## 2017-02-24 NOTE — Progress Notes (Signed)
Patient: Joel Burgess, Male    DOB: [birth year], 41 y.o.   MRN: [id number] Visit Date: 02/24/2017  Today's Provider: Lelon Huh, MD   Chief Complaint  Patient presents with  . Annual Exam   Subjective:    Annual physical exam Joel Burgess is a 41 y.o. male who presents today for health maintenance and complete physical. He feels well. He reports exercising none. He reports he is sleeping well.  Anxiety, follow up: Patient was last seen 10/2015 and no changes were made in his medication. Patient is currently taking Xanax 1mg  as needed, and reports good symptom control.   Review of Systems  Constitutional: Negative.   HENT: Negative.   Eyes: Negative.   Respiratory: Negative.   Cardiovascular: Negative.   Gastrointestinal: Negative.   Endocrine: Negative.   Genitourinary: Negative.   Musculoskeletal: Positive for arthralgias, back pain and myalgias.  Skin: Negative.   Allergic/Immunologic: Negative.   Neurological: Negative.   Hematological: Negative.   Psychiatric/Behavioral: Negative.     Social History      He  reports that he has never smoked. He has never used smokeless tobacco. He reports that he does not drink alcohol or use drugs.       Social History   Social History  . Marital status: Married    Spouse name: N/A  . Number of children: 2  . Years of education: N/A   Occupational History  . 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 Smoking status: Never Smoker  . Smokeless tobacco: Never Used  . Alcohol use No  . Drug use: No  . Sexual activity: Yes    Birth control/ protection: None   Other Topics Concern  . None   Social History Narrative  . None    Past Medical History:  Diagnosis Date  . ADHD (attention deficit hyperactivity disorder)   . Anxiety      Patient Active Problem List   Diagnosis Date Noted  . Anxiety, generalized 03/31/2015  . ADD (attention deficit disorder) 03/31/2015    Past Surgical  History:  Procedure Laterality Date  . APPENDECTOMY    . KNEE SURGERY Right     Family History        Family Status  Relation Status  . Mother Alive  . Sister Alive  . Father Alive  . Maternal Uncle Alive        His family history includes ADD / ADHD in his sister; Anxiety disorder in his mother and sister; Bipolar disorder in his maternal uncle and sister; Depression in his father and mother; Hypertension in his father and mother; Schizophrenia in his maternal uncle.     Allergies  Allergen Reactions  . Penicillins Anaphylaxis    Has patient had a PCN reaction causing immediate rash, facial/tongue/throat swelling, SOB or lightheadedness with hypotension: Yes Has patient had a PCN reaction causing severe rash involving mucus membranes or skin necrosis: No Has patient had a PCN reaction that required hospitalization pt not sure Has patient had a PCN reaction occurring within the last 10 years: No If all of the above answers are "NO", then may proceed with Cephalosporin use.     Current Outpatient Prescriptions:  .  ALPRAZolam (XANAX) 1 MG tablet, TAKE ONE TABLET BY MOUTH TWICE DAILY AS NEEDED FOR ANXIETY, Disp: 60 tablet, Rfl: 3 .  amphetamine-dextroamphetamine (ADDERALL) 30 MG tablet, Take 1 tablet by mouth 2 (two) times daily., Disp: 60 tablet, Rfl:  0 .  diazepam (VALIUM) 10 MG tablet, TAKE ONE TABLET BY MOUTH EVERY DAY AS NEEDED (FOR MUSCLE SPASM), Disp: 30 tablet, Rfl: 4 .  PROAIR HFA 108 (90 Base) MCG/ACT inhaler, INHALE TWO PUFFS INTO THE LUNGS EVERY 6 HOURS AS NEEDED FOR WHEEZING, Disp: 8.5 g, Rfl: 5 .  amphetamine-dextroamphetamine (ADDERALL) 30 MG tablet, Take 1 tablet by mouth 2 (two) times daily., Disp: 60 tablet, Rfl: 0 .  amphetamine-dextroamphetamine (ADDERALL) 30 MG tablet, Take 1 tablet by mouth 2 (two) times daily., Disp: 60 tablet, Rfl: 0 .  amphetamine-dextroamphetamine (ADDERALL) 30 MG tablet, Take 1 tablet by mouth 2 (two) times daily., Disp: 60 tablet, Rfl: 0     Patient Care Team: Birdie Sons, MD as PCP - General (Family Medicine) Birdie Sons, MD as Referring Physician (Family Medicine) Seeplaputhur Robinette Haines, MD (General Surgery)      Objective:   Vitals: BP 122/62 (BP Location: Right Arm, Patient Position: Sitting, Cuff Size: Normal)   Pulse 68   Temp 98.9 F (37.2 C)   Resp 16   Ht 5\' 8"  (1.727 m)   Wt 178 lb (80.7 kg)   BMI 27.06 kg/m    Vitals:   02/24/17 1404  BP: 122/62  Pulse: 68  Resp: 16  Temp: 98.9 F (37.2 C)  Weight: 178 lb (80.7 kg)  Height: 5\' 8"  (1.727 m)     Physical Exam   General Appearance:    Alert, cooperative, no distress, appears stated age  Head:    Normocephalic, without obvious abnormality, atraumatic  Eyes:    PERRL, conjunctiva/corneas clear, EOM's intact, fundi    benign, both eyes       Ears:    Normal TM's and external ear canals, both ears  Nose:   Nares normal, septum midline, mucosa normal, no drainage   or sinus tenderness  Throat:   Lips, mucosa, and tongue normal; teeth and gums normal  Neck:   Supple, symmetrical, trachea midline, no adenopathy;       thyroid:  No enlargement/tenderness/nodules; no carotid   bruit or JVD  Back:     Symmetric, no curvature, ROM normal, no CVA tenderness  Lungs:     Clear to auscultation bilaterally, respirations unlabored  Chest wall:    No tenderness or deformity  Heart:    Regular rate and rhythm, S1 and S2 normal, no murmur, rub   or gallop  Abdomen:     Soft, non-tender, bowel sounds active all four quadrants,    no masses, no organomegaly  Genitalia:    deferred  Rectal:    deferred  Extremities:   Extremities normal, atraumatic, no cyanosis or edema  Pulses:   2+ and symmetric all extremities  Skin:   Skin color, texture, turgor normal, no rashes or lesions. Large well circumscribed homogenous darkly pigmented oval nevus left flank which he states has not changed since childhood.   Lymph nodes:   Cervical, supraclavicular, and  axillary nodes normal  Neurologic:   CNII-XII intact. Normal strength, sensation and reflexes      throughout    Depression Screen No flowsheet data found.    Assessment & Plan:     Routine Health Maintenance and Physical Exam  Exercise Activities and Dietary recommendations Goals    None       There is no immunization history on file for this patient.  Health Maintenance  Topic Date Due  . HIV Screening  06/05/1990  . TETANUS/TDAP  06/05/1994  .  INFLUENZA VACCINE  08/22/2017 (Originally 07/23/2016)     Discussed health benefits of physical activity, and encouraged him to engage in regular exercise appropriate for his age and condition.    1. Annual physical exam Generally doing well. He has had a little bit of discomfort in his back and wants to have his kidneys checked, but is unable to give urine sample today. He plans to stop by in the morning to gets his labs drawn and to give urine sample.   - Comprehensive metabolic panel - Lipid panel     Lelon Huh, MD  [facility]

## 2017-02-25 ENCOUNTER — Other Ambulatory Visit (INDEPENDENT_AMBULATORY_CARE_PROVIDER_SITE_OTHER): Payer: Managed Care, Other (non HMO)

## 2017-02-25 DIAGNOSIS — M545 Low back pain, unspecified: Secondary | ICD-10-CM

## 2017-02-25 LAB — POCT URINALYSIS DIPSTICK
Bilirubin, UA: NEGATIVE
GLUCOSE UA: NEGATIVE
Ketones, UA: NEGATIVE
Leukocytes, UA: NEGATIVE
NITRITE UA: NEGATIVE
PH UA: 7
Protein, UA: NEGATIVE
Spec Grav, UA: 1.02
UROBILINOGEN UA: 0.2

## 2017-02-26 LAB — COMPREHENSIVE METABOLIC PANEL
ALT: 28 IU/L (ref 0–44)
AST: 22 IU/L (ref 0–40)
Albumin/Globulin Ratio: 1.6 (ref 1.2–2.2)
Albumin: 4.8 g/dL (ref 3.5–5.5)
Alkaline Phosphatase: 101 IU/L (ref 39–117)
BUN/Creatinine Ratio: 16 (ref 9–20)
BUN: 14 mg/dL (ref 6–24)
Bilirubin Total: 0.4 mg/dL (ref 0.0–1.2)
CALCIUM: 10 mg/dL (ref 8.7–10.2)
CO2: 24 mmol/L (ref 18–29)
CREATININE: 0.9 mg/dL (ref 0.76–1.27)
Chloride: 99 mmol/L (ref 96–106)
GFR calc Af Amer: 122 mL/min/{1.73_m2} (ref >59)
GFR, EST NON AFRICAN AMERICAN: 106 mL/min/{1.73_m2} (ref >59)
Globulin, Total: 3 g/dL (ref 1.5–4.5)
Glucose: 99 mg/dL (ref 65–99)
Potassium: 4.5 mmol/L (ref 3.5–5.2)
Sodium: 141 mmol/L (ref 134–144)
Total Protein: 7.8 g/dL (ref 6.0–8.5)

## 2017-02-26 LAB — LIPID PANEL
CHOL/HDL RATIO: 5.8 ratio — AB (ref 0.0–5.0)
Cholesterol, Total: 221 mg/dL — ABNORMAL HIGH (ref 100–199)
HDL: 38 mg/dL — AB (ref >39)
LDL CALC: 155 mg/dL — AB (ref 0–99)
TRIGLYCERIDES: 140 mg/dL (ref 0–149)
VLDL Cholesterol Cal: 28 mg/dL (ref 5–40)

## 2017-03-03 ENCOUNTER — Other Ambulatory Visit: Payer: Self-pay | Admitting: Family Medicine

## 2017-03-04 NOTE — Telephone Encounter (Signed)
Rx called in to pharmacy. 

## 2017-03-12 ENCOUNTER — Ambulatory Visit: Payer: Managed Care, Other (non HMO) | Attending: Physician Assistant

## 2017-03-12 ENCOUNTER — Encounter: Payer: Self-pay | Admitting: Family Medicine

## 2017-03-12 DIAGNOSIS — M25571 Pain in right ankle and joints of right foot: Secondary | ICD-10-CM

## 2017-03-12 DIAGNOSIS — M6281 Muscle weakness (generalized): Secondary | ICD-10-CM | POA: Diagnosis present

## 2017-03-12 DIAGNOSIS — M25671 Stiffness of right ankle, not elsewhere classified: Secondary | ICD-10-CM | POA: Diagnosis present

## 2017-03-12 NOTE — Therapy (Signed)
[facility] [address], [place], [address] Phone: [phone]   Fax:  [phone]  Physical Therapy Evaluation  Patient Details  Name: Joel Burgess MRN: [id number] Date of Birth: 1976/09/05 Referring Provider: [name]  Encounter Date: 03/12/2017      PT End of Session - 03/12/17 1529    Visit Number 1   Number of Visits 17   Date for PT Re-Evaluation 05/07/17   Authorization Type no g codes   PT Start Time 1300   PT Stop Time 1400   PT Time Calculation (min) 60 min   Activity Tolerance Patient tolerated treatment well   Behavior During Therapy [facility] for tasks assessed/performed      Past Medical History:  Diagnosis Date  . ADHD (attention deficit hyperactivity disorder)   . Anxiety     Past Surgical History:  Procedure Laterality Date  . APPENDECTOMY    . KNEE SURGERY Right     There were no vitals filed for this visit.       Subjective Assessment - 03/12/17 1307    Subjective Closed R tibia fracture s/p ORIF   Pertinent History [name] is a 41 y.o. male with h/o asthma, anxiety, and ADHD who underwent ORIF with IMN of a right closed tibia shaft fracture and pinning of the distal fibula fracture 08/05/2016 by Dr. Marlou Starks. His date of injury is 08/04/2016 when he was wrestling to control his 70 yo son, who has cognitive impairment. His son subsequently fell on him resulting in the injury. Following surgery, he went for physical therapy at [facility] from October through November 2017. He reports that after November he thought he could continue therapy on his own and was discharged. He continued to walk with a limp and have difficulty with his ankle range of motion and strength. He reports that about 3 weeks ago he had insidious onset of pain and popping in the right knee and ankle. He recently went back for a follow-up with orthopedics at [facility] and they imaged his leg with  well healing fractures and no concerns. They stated that he could return to activities without restriction or limitation. They suggested he restart therapy due to continued limitations in RLE strength and mobility. Script provided for physical therapy to work on ankle/knee motion, leg strengthening, walking, and balance.   Limitations Walking   Patient Stated Goals Walk without a limp, return to running, improve his ability to swim at the [facility]   Currently in Pain? Yes  Pt reports that he is not concerned about pain but mobility   Pain Score 0-No pain  Worst: 3/10 Best: 0/10   Pain Location Ankle   Pain Orientation Right   Pain Descriptors / Indicators Tightness   Pain Type Chronic pain   Pain Onset More than a month ago   Pain Frequency Intermittent            [facility] PT Assessment - 03/12/17 1307      Assessment   Medical Diagnosis Closed R tibia fracture s/p ORIF   Referring Provider [name]   Onset Date/Surgical Date 08/05/16  Injury occurred 08/04/16   Hand Dominance Right   Next MD Visit September 2018   Prior Therapy Yes at [place] following surgery     Precautions   Precautions None   Precaution Comments MD note states that pt can return to activities without restriction or limitations  Restrictions   Weight Bearing Restrictions No     Balance Screen   Has the patient fallen in the past 6 months No   Has the patient had a decrease in activity level because of a fear of falling?  Yes   Is the patient reluctant to leave their home because of a fear of falling?  No     Home Social worker Private residence   Living Arrangements Spouse/significant other;Children   Available Help at Discharge Family   Type of [place]     Prior Function   Level of Independence Independent   Vocation Full time employment   Associate Professor, bending and lifting   Leisure Likes to run and swim     Cognition   Overall  Cognitive Status Within Functional Limits for tasks assessed     Observation/Other Assessments   Other Surveys  Other Surveys  Pt did not complete LEFS correctly     Sensation   Additional Comments Denies N/T in RLE     Posture/Postural Control   Posture Comments No gross abnormalities noted in standing posture     ROM / Strength   AROM / PROM / Strength AROM;Strength     AROM   Overall AROM Comments [place]: no pain bilaterally, heel to bottom: 4 inches on LLE, 8 inches on RLE. Bilateral knee flexion/extension is WFL and symmetrical.    AROM Assessment Site Ankle   Right/Left Knee --   Right/Left Ankle Right;Left   Right Ankle Dorsiflexion 14  Measured in WB position   Right Ankle Plantar Flexion 51   Right Ankle Inversion 24   Right Ankle Eversion 14   Left Ankle Dorsiflexion 32     Strength   Overall Strength Deficits   Overall Strength Comments LLE grossly 5/5 throughout, >20 full heel raises on LLE, unable to perform one heel raise against gravity on RLE   Strength Assessment Site Ankle;Knee;Hip   Right/Left Hip Right   Right Hip Flexion 4-/5   Right Hip Extension 4-/5   Right Hip External Rotation  4-/5   Right Hip Internal Rotation 4-/5   Right Hip ABduction 4-/5   Right Hip ADduction 4-/5   Right/Left Knee Right   Right Knee Flexion 4/5   Right Knee Extension 4/5   Right/Left Ankle Right   Right Ankle Dorsiflexion 4-/5   Right Ankle Plantar Flexion 2+/5  Unable to lift heel off ground in standing   Right Ankle Inversion 4/5   Right Ankle Eversion 4/5     Palpation   Palpation comment Mild pain near lateral malleolus and distal tibia on RLE     Ambulation/Gait   Gait Comments Pt ambulates with antalgic gait, decreased stance time on RLE with notable limp. Decreased R push off at terminal stance. Excessive frontal plane pelvis motion     Balance   Balance Assessed No  To be assessed at next session         TREATMENT  Ther-ex Long sitting R ankle  plantarflexion with resistance band 3 x 10, started with red and was able to work up to blue in order to find the correct amount of resistance; Pt issued written handout with demonstration about how to perform R gastroc stretch, R soleus stretch, and wall squats; Extensive education about importance of HEP                PT Education - 03/12/17 1529    Education provided Yes  Education Details Exam findings, plan of care, HEP   Person(s) Educated Patient   Methods Explanation;Demonstration;Verbal cues;Handout   Comprehension Verbalized understanding;Returned demonstration             PT Long Term Goals - 03/13/17 0916      PT LONG TERM GOAL #1   Title Pt will be independent with HEP in order to improve strength and balance in order to decrease fall risk and improve function at home and work.    Time 8   Period Weeks   Status New     PT LONG TERM GOAL #2   Title Pt will improve R hip abduction and extension strength to at least 4+/5 in order to improve gait mechanics and return to patient's goal of running and swimming   Baseline 03/12/17: 4-/5 for abduction and extension   Time 8   Period Weeks   Status New     PT LONG TERM GOAL #3   Title Pt will improve R ankle plantarflexion strength so that he can perform at least 10 heel raises in standing in order to improve push off so that he can run   Baseline 03/12/17: unable to clear heel from ground   Time 8   Period Weeks   Status New     PT LONG TERM GOAL #4   Title Pt will improve R ankle DF AAROM in standing to at least 20 degrees in order to improve gait and running mechanics   Baseline 03/12/17: 14 degrees   Time 8   Period Weeks   Status New               Plan - 03/12/17 1529    Clinical Impression Statement Pt is a pleasant 41 yo male who underwent ORIF with IMN of a right closed tibia shaft fracture and pinning of the distal fibula fracture 08/05/2016 by Dr. Marlou Starks. Following surgery, he went for  physical therapy at [facility] from October through November 2017. After November he discharged from therapy services believing he could continue independently. It is unclear whether he continued his exercises following discharge from therapy or not. He presents today with considerable left hip, knee, and ankle weakness compared to the left side. He is unable to clear his right heel from the ground with an attempted standing heel raise and has approximately 14 degrees of right ankle dorsiflexion compared to 32 degrees on the left side as measured in standing. Pt ambulates with an antalgic gait on the R side with decreased right push off and decreased stance time on the right side. He will benefit from skilled PT services to address deficits in RLE strength and, to a lesser degree, pain in order to resume full function at home, work, and with recreational activities.     Rehab Potential Good   Clinical Impairments Affecting Rehab Potential Positive: motivation, age; Negative: persistent weakness/ROM deficits   PT Frequency 2x / week   PT Duration 8 weeks   PT Treatment/Interventions ADLs/Self Care Home Management;Aquatic Therapy;Biofeedback;Cryotherapy;Electrical Stimulation;Iontophoresis 4mg /ml Dexamethasone;Moist Heat;DME Instruction;Gait training;Stair training;Functional mobility training;Therapeutic activities;Therapeutic exercise;Neuromuscular re-education;Balance training;Patient/family education;Manual techniques;Passive range of motion   PT Next Visit Plan Have pt complete LEFS, Progress RLE strengthening, assess balance (SLS on RLE), progress HEP   PT Home Exercise Plan R ankle T band resisted PF, standing gastroc step stretch, standing soleus stretch, wall squats   Consulted and Agree with Plan of Care Patient      Patient will benefit from skilled  therapeutic intervention in order to improve the following deficits and impairments:  Abnormal gait, Decreased strength, Difficulty  walking, Hypomobility, Impaired flexibility, Pain  Visit Diagnosis: Muscle weakness (generalized) - Plan: PT plan of care cert/re-cert  Stiffness of right ankle, not elsewhere classified - Plan: PT plan of care cert/re-cert  Pain in right ankle and joints of right foot - Plan: PT plan of care cert/re-cert     Problem List Patient Active Problem List   Diagnosis Date Noted  . Anxiety, generalized 03/31/2015  . ADD (attention deficit disorder) 03/31/2015   Phillips Grout PT, DPT   Dawnna Gritz 03/13/2017, 9:30 AM  [facility] [address], [place], [address] Phone: [phone]   Fax:  [phone]  Name: Joel Burgess MRN: [id number] Date of Birth: 09/23/1976

## 2017-03-18 ENCOUNTER — Ambulatory Visit: Payer: Managed Care, Other (non HMO) | Admitting: Physical Therapy

## 2017-03-19 ENCOUNTER — Ambulatory Visit (INDEPENDENT_AMBULATORY_CARE_PROVIDER_SITE_OTHER): Payer: Managed Care, Other (non HMO) | Admitting: Family Medicine

## 2017-03-19 ENCOUNTER — Encounter: Payer: Self-pay | Admitting: Family Medicine

## 2017-03-19 VITALS — BP 130/78 | HR 75 | Temp 98.2°F | Resp 16 | Wt 179.0 lb

## 2017-03-19 DIAGNOSIS — J029 Acute pharyngitis, unspecified: Secondary | ICD-10-CM | POA: Diagnosis not present

## 2017-03-19 DIAGNOSIS — J069 Acute upper respiratory infection, unspecified: Secondary | ICD-10-CM

## 2017-03-19 LAB — POCT RAPID STREP A (OFFICE): Rapid Strep A Screen: NEGATIVE

## 2017-03-19 IMAGING — DX DG TIBIA/FIBULA PORT 2V*R*
5 series · 5 of 5 positions shown · non-contrast
Comparison: None

CLINICAL DATA: Injury this morning trying to calm his son down,
caught his leg between signs legs, heard a pop, deformity, initial
encounter

EXAM:
PORTABLE RIGHT TIBIA AND FIBULA - 2 VIEW

[ankle ap (1 of 2)]
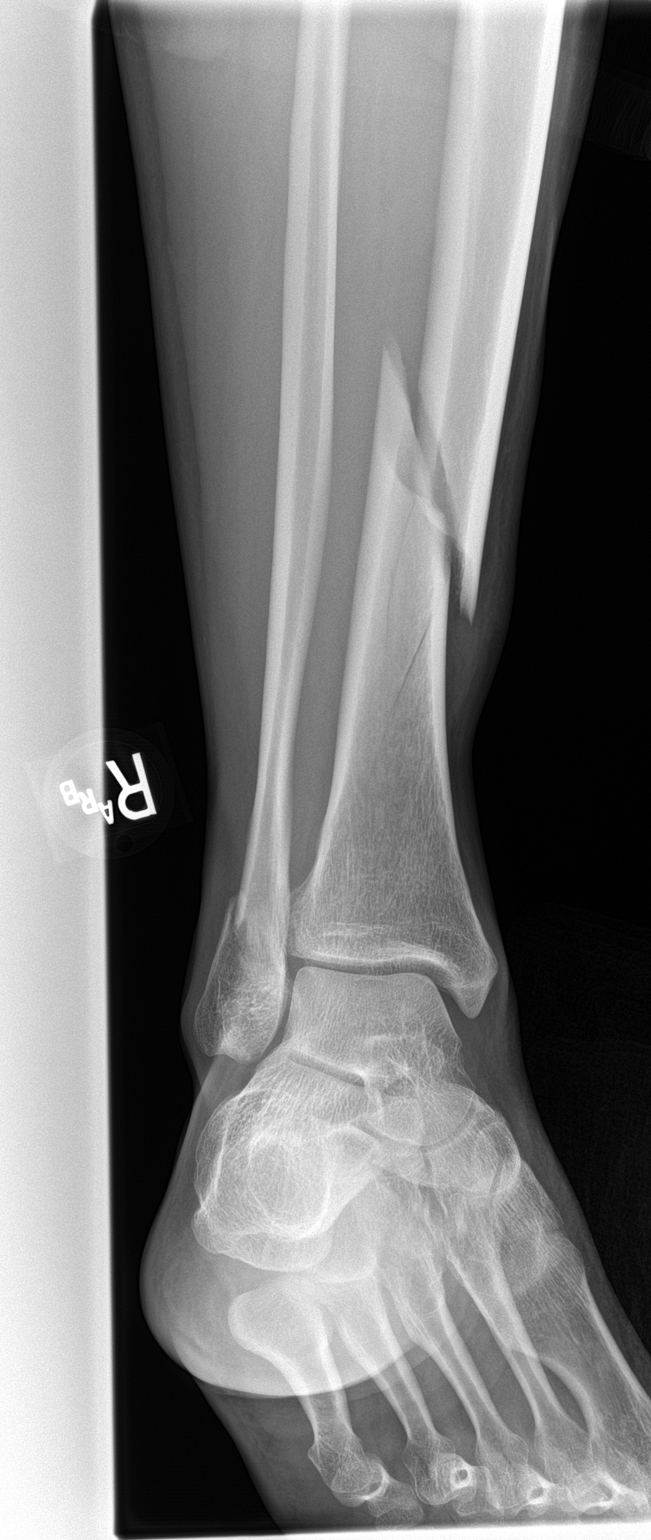

[ankle obl]
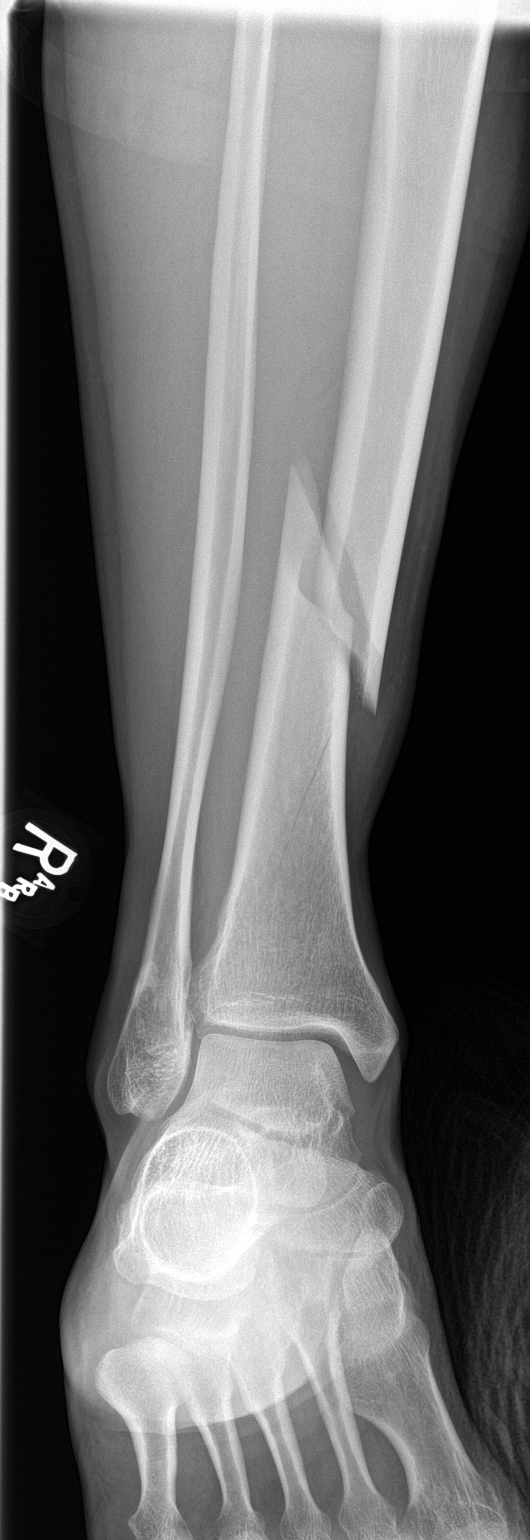

[ankle lat (1 of 2)]
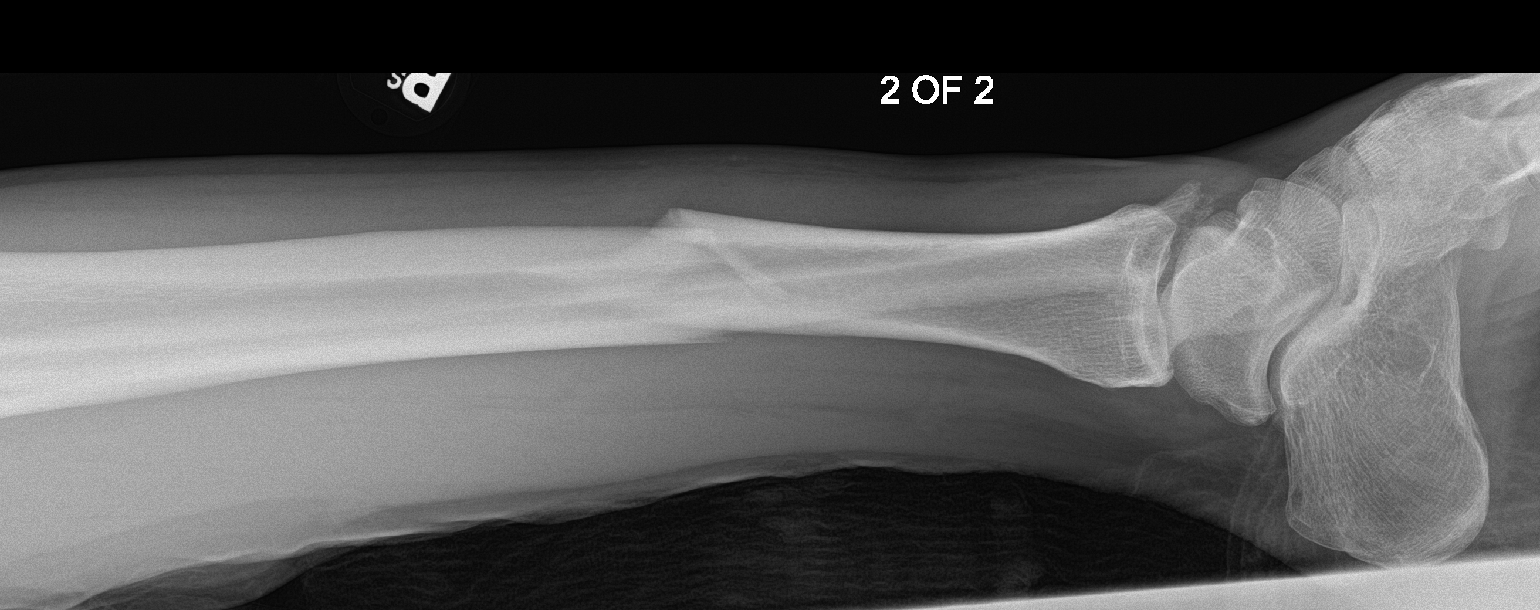

[ankle ap (2 of 2)]
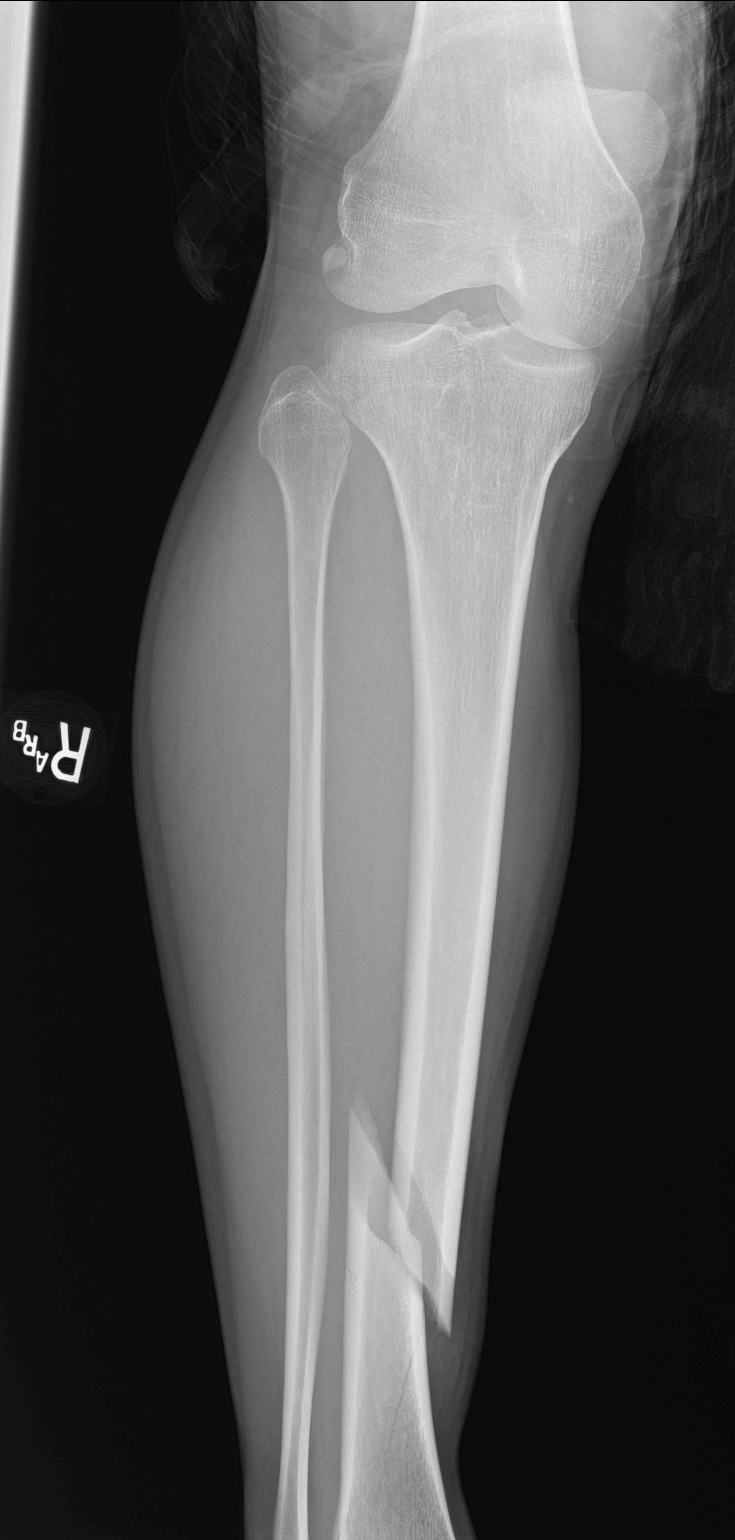

[ankle lat (2 of 2)]
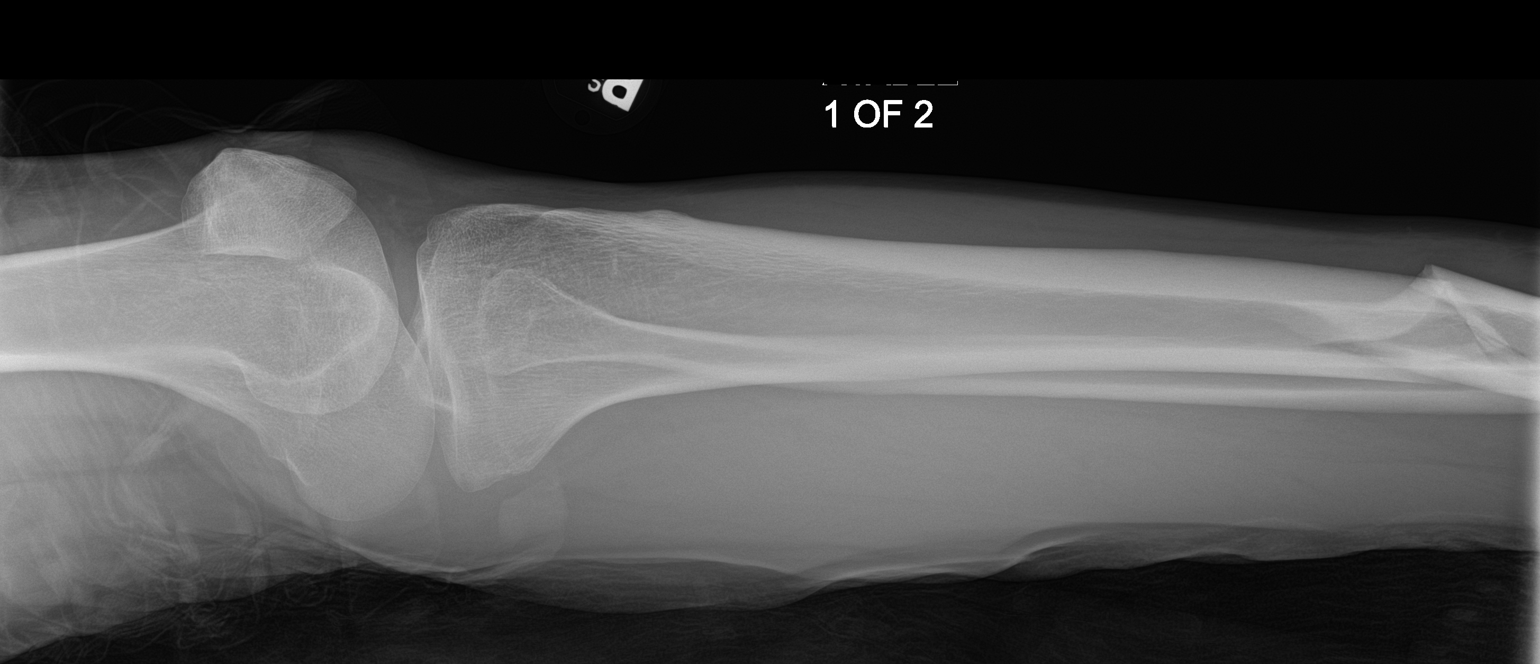

[5 of 5 positions shown; findings below may reference images not displayed]

FINDINGS: Osseous mineralization grossly normal.

Oblique minimally displaced fracture of the lateral malleolus.

Oblique fracture of the mid to distal RIGHT tibial diaphysis with
mild anterior and medial displacement.

Additional nondisplaced fracture planes extend down the diaphysis of
the distal fragment without definite intra-articular extension.

No additional fracture, dislocation, or bone destruction.

Knee and ankle joint alignments normal.
IMPRESSION: Displaced oblique fracture RIGHT lateral malleolus.

Displaced diaphyseal fracture mid to distal RIGHT tibia as above.

## 2017-03-19 NOTE — Patient Instructions (Signed)
Upper Respiratory Infection, Adult Most upper respiratory infections (URIs) are caused by a virus. A URI affects the nose, throat, and upper air passages. The most common type of URI is often called "the common cold." Follow these instructions at home:  Take medicines only as told by your doctor.  Gargle warm saltwater or take cough drops to comfort your throat as told by your doctor.  Use a warm mist humidifier or inhale steam from a shower to increase air moisture. This may make it easier to breathe.  Drink enough fluid to keep your pee (urine) clear or pale yellow.  Eat soups and other clear broths.  Have a healthy diet.  Rest as needed.  Go back to work when your fever is gone or your doctor says it is okay.  You may need to stay home longer to avoid giving your URI to others.  You can also wear a face mask and wash your hands often to prevent spread of the virus.  Use your inhaler more if you have asthma.  Do not use any tobacco products, including cigarettes, chewing tobacco, or electronic cigarettes. If you need help quitting, ask your doctor. Contact a doctor if:  You are getting worse, not better.  Your symptoms are not helped by medicine.  You have chills.  You are getting more short of breath.  You have brown or red mucus.  You have yellow or brown discharge from your nose.  You have pain in your face, especially when you bend forward.  You have a fever.  You have puffy (swollen) neck glands.  You have pain while swallowing.  You have white areas in the back of your throat. Get help right away if:  You have very bad or constant:  Headache.  Ear pain.  Pain in your forehead, behind your eyes, and over your cheekbones (sinus pain).  Chest pain.  You have long-lasting (chronic) lung disease and any of the following:  Wheezing.  Long-lasting cough.  Coughing up blood.  A change in your usual mucus.  You have a stiff neck.  You have  changes in your:  Vision.  Hearing.  Thinking.  Mood. This information is not intended to replace advice given to you by your health care provider. Make sure you discuss any questions you have with your health care provider. Document Released: 05/27/2008 Document Revised: 08/11/2016 Document Reviewed: 03/16/2014 Elsevier Interactive Patient Education  2017 Elsevier Inc.  

## 2017-03-19 NOTE — Progress Notes (Signed)
Patient: Joel Burgess Male    DOB: 10/31/1976   41 y.o.   MRN: [id number] Visit Date: 03/19/2017  Today's Provider: Lelon Huh, MD   Chief Complaint  Patient presents with  . Sore Throat    x 5 days   Subjective:    Sore Throat   This is a new problem. Episode onset: 5 days ago. The problem has been gradually worsening. Neither side of throat is experiencing more pain than the other. There has been no fever. Associated symptoms include congestion (sinus congestion on right side), coughing, a hoarse voice, trouble swallowing and vomiting (single episode 5 days ago; now resolved). Pertinent negatives include no abdominal pain, diarrhea, ear discharge, ear pain, headaches, plugged ear sensation, neck pain, shortness of breath or swollen glands. He has had no exposure to strep or mono. He has tried NSAIDs (has also tried sipping on hot Ginger and Lemon tea) for the symptoms. The treatment provided mild relief.       Allergies  Allergen Reactions  . Penicillins Anaphylaxis    Has patient had a PCN reaction causing immediate rash, facial/tongue/throat swelling, SOB or lightheadedness with hypotension: Yes Has patient had a PCN reaction causing severe rash involving mucus membranes or skin necrosis: No Has patient had a PCN reaction that required hospitalization pt not sure Has patient had a PCN reaction occurring within the last 10 years: No If all of the above answers are "NO", then may proceed with Cephalosporin use.     Current Outpatient Prescriptions:  .  ALPRAZolam (XANAX) 1 MG tablet, TAKE ONE TABLET TWICE DAILY AS NEEDED FOR ANXIETY, Disp: 60 tablet, Rfl: 5 .  amphetamine-dextroamphetamine (ADDERALL) 30 MG tablet, Take 1 tablet by mouth 2 (two) times daily., Disp: 60 tablet, Rfl: 0 .  amphetamine-dextroamphetamine (ADDERALL) 30 MG tablet, Take 1 tablet by mouth 2 (two) times daily., Disp: 60 tablet, Rfl: 0 .  amphetamine-dextroamphetamine (ADDERALL) 30 MG tablet, Take  1 tablet by mouth 2 (two) times daily., Disp: 60 tablet, Rfl: 0 .  amphetamine-dextroamphetamine (ADDERALL) 30 MG tablet, Take 1 tablet by mouth 2 (two) times daily., Disp: 60 tablet, Rfl: 0 .  diazepam (VALIUM) 10 MG tablet, TAKE ONE TABLET BY MOUTH EVERY DAY AS NEEDED FOR MUSCLE SPASMS, Disp: 30 tablet, Rfl: 5 .  PROAIR HFA 108 (90 Base) MCG/ACT inhaler, INHALE TWO PUFFS INTO THE LUNGS EVERY 6 HOURS AS NEEDED FOR WHEEZING, Disp: 8.5 g, Rfl: 5  Review of Systems  Constitutional: Negative for appetite change, chills and fever.  HENT: Positive for congestion (sinus congestion on right side), hoarse voice, postnasal drip, rhinorrhea, sinus pressure, sore throat, trouble swallowing and voice change. Negative for ear discharge, ear pain and sneezing.   Respiratory: Positive for cough. Negative for chest tightness, shortness of breath and wheezing.   Cardiovascular: Negative for chest pain and palpitations.  Gastrointestinal: Positive for vomiting (single episode 5 days ago; now resolved). Negative for abdominal pain, diarrhea and nausea.  Musculoskeletal: Negative for neck pain.  Neurological: Negative for headaches.    Social History  Substance Use Topics  . Smoking status: Never Smoker  . Smokeless tobacco: Never Used  . Alcohol use No   Objective:   BP 130/78 (BP Location: Left Arm, Patient Position: Sitting, Cuff Size: Normal)   Pulse 75   Temp 98.2 F (36.8 C) (Oral)   Resp 16   Wt 179 lb (81.2 kg)   SpO2 96% Comment: room air  BMI 27.22 kg/m  There were no vitals filed for this visit.   Physical Exam  General Appearance:    Alert, cooperative, no distress  HENT:   bilateral TM normal without fluid or infection, TM fluid noted, neck without nodes, throat normal without erythema or exudate, pharynx erythematous without exudate, sinuses nontender and nasal mucosa pale and congested  Eyes:    PERRL, conjunctiva/corneas clear, EOM's intact       Lungs:     Clear to auscultation  bilaterally, respirations unlabored  Heart:    Regular rate and rhythm  Neurologic:   Awake, alert, oriented x 3. No apparent focal neurological           defect.           Assessment & Plan:     1. Sore throat  - POCT rapid strep A  2. Upper respiratory tract infection, unspecified type Instructions for symptomatic treatment.  Work excuse today and yesterday.  Call if symptoms change or if not rapidly improving.          Lelon Huh, MD  [facility]

## 2017-03-20 ENCOUNTER — Ambulatory Visit: Payer: Managed Care, Other (non HMO)

## 2017-03-20 DIAGNOSIS — M25671 Stiffness of right ankle, not elsewhere classified: Secondary | ICD-10-CM

## 2017-03-20 DIAGNOSIS — M6281 Muscle weakness (generalized): Secondary | ICD-10-CM

## 2017-03-20 NOTE — Therapy (Signed)
[facility] [address] [place], [address] Phone: [phone]   Fax:  [phone]  Physical Therapy Treatment  Patient Details  Name: Joel Burgess MRN: [id number] Date of Birth: Apr 19, 1976 Referring Provider: [name]  Encounter Date: 03/20/2017      PT End of Session - 03/20/17 1319    Visit Number 2   Number of Visits 17   Date for PT Re-Evaluation 05/07/17   Authorization Type no g codes   PT Start Time 6962   PT Stop Time 1230   PT Time Calculation (min) 45 min   Activity Tolerance Patient tolerated treatment well   Behavior During Therapy [facility] for tasks assessed/performed      Past Medical History:  Diagnosis Date  . ADHD (attention deficit hyperactivity disorder)   . Anxiety     Past Surgical History:  Procedure Laterality Date  . APPENDECTOMY    . KNEE SURGERY Right     There were no vitals filed for this visit.      Subjective Assessment - 03/20/17 1211    Subjective Pt reports he is doing well on this date. He has noticed improvement in his R ankle plantarflexion strength. He has started to perform some offweighted heel raises in standing. No specific questions or concerns at this time.    Pertinent History [name] is a 41 y.o. male with h/o asthma, anxiety, and ADHD who underwent ORIF with IMN of a right closed tibia shaft fracture and pinning of the distal fibula fracture 08/05/2016 by Dr. Marlou Starks. His date of injury is 08/04/2016 when he was wrestling to control his 3 yo son, who has cognitive impairment. His son subsequently fell on him resulting in the injury. Following surgery, he went for physical therapy at [facility] from October through November 2017. He reports that after November he thought he could continue therapy on his own and was discharged. He continued to walk with a limp and have difficulty with his ankle range of motion and strength. He  reports that about 3 weeks ago he had insidious onset of pain and popping in the right knee and ankle. He recently went back for a follow-up with orthopedics at [facility] and they imaged his leg with well healing fractures and no concerns. They stated that he could return to activities without restriction or limitation. They suggested he restart therapy due to continued limitations in RLE strength and mobility. Script provided for physical therapy to work on ankle/knee motion, leg strengthening, walking, and balance.   Limitations Walking   Patient Stated Goals Walk without a limp, return to running, improve his ability to swim at the [facility]   Currently in Pain? No/denies   Pain Onset --            [facility] PT Assessment - 03/20/17 1319      Observation/Other Assessments   Other Surveys  Other Surveys   Lower Extremity Functional Scale  51/80             TREATMENT  Ther-ex ProStretch gastroc stretch 30s x 3; Standing soleus stretch 30s x 3, correction provided as pt was performing incorrectly at home; Standing heel raises, increase weight shift to RLE, 3 x 10; Total gym L17 R single leg squats x 10, pt struggles with this and reports R anterior knee pain so discontinued; Hooklying R single leg bridges 3 x 10;  Manual Therapy R ankle DF stretch in supine with  ankle off table 30s hold x 3; R ankle subtalar AP mobilizations at end range DF, grade III, 30s/bout x 3 bouts; R ankle TC distraction, grade III, 30s/bout x 3 bouts; Extensive education about importance of HEP and modification.               PT Education - 03/20/17 1319    Education provided Yes   Education Details HEP progression   Person(s) Educated Patient   Methods Explanation;Demonstration;Verbal cues;Handout   Comprehension Verbalized understanding;Returned demonstration             PT Long Term Goals - 03/20/17 1321      PT LONG TERM GOAL #1   Title Pt will be independent with HEP in order to  improve strength and balance in order to decrease fall risk and improve function at home and work.    Time 8   Period Weeks   Status On-going     PT LONG TERM GOAL #2   Title Pt will improve R hip abduction and extension strength to at least 4+/5 in order to improve gait mechanics and return to patient's goal of running and swimming   Baseline 03/12/17: 4-/5 for abduction and extension   Time 8   Period Weeks   Status On-going     PT LONG TERM GOAL #3   Title Pt will improve R ankle plantarflexion strength so that he can perform at least 10 heel raises in standing in order to improve push off so that he can run   Baseline 03/12/17: unable to clear heel from ground   Time 8   Period Weeks   Status On-going     PT LONG TERM GOAL #4   Title Pt will improve R ankle DF AAROM in standing to at least 20 degrees in order to improve gait and running mechanics   Baseline 03/12/17: 14 degrees   Time 8   Period Weeks   Status On-going     PT LONG TERM GOAL #5   Title Pt will increase LEFS by at least 9 points in order to demonstrate significant improvement in lower extremity function.     Baseline 03/20/17: 51/80   Time 8   Period Weeks   Status New               Plan - 03/20/17 1156    Clinical Impression Statement Pt demonstrating excellent progress with physical therapy. He is able to peform a partial R heel raise which is an improvement from his initial evaluation. His R ankle DF AAROM appears improved. Will measure at next visit. Progressed HEP today. Pt will continue to benefit from skilled PT services to address remaining deficits in order to improve function at home and work.    Rehab Potential Good   Clinical Impairments Affecting Rehab Potential Positive: motivation, age; Negative: persistent weakness/ROM deficits   PT Frequency 2x / week   PT Duration 8 weeks   PT Treatment/Interventions ADLs/Self Care Home Management;Aquatic Therapy;Biofeedback;Cryotherapy;Electrical  Stimulation;Iontophoresis 4mg /ml Dexamethasone;Moist Heat;DME Instruction;Gait training;Stair training;Functional mobility training;Therapeutic activities;Therapeutic exercise;Neuromuscular re-education;Balance training;Patient/family education;Manual techniques;Passive range of motion   PT Next Visit Plan  Measure R ankle AAROM DF in standing, Progress RLE strengthening, assess balance (SLS on RLE), mobilizations for R ankle DF, progress HEP as appropriate (consider progression from bridges to forward or reverse lunges.   PT Home Exercise Plan Heel raises with increased weighting to R side, standing gastroc step stretch, standing soleus stretch, hooklying bridges, lateral lunges  Consulted and Agree with Plan of Care Patient      Patient will benefit from skilled therapeutic intervention in order to improve the following deficits and impairments:  Abnormal gait, Decreased strength, Difficulty walking, Hypomobility, Impaired flexibility, Pain  Visit Diagnosis: Stiffness of right ankle, not elsewhere classified  Muscle weakness (generalized)     Problem List Patient Active Problem List   Diagnosis Date Noted  . Anxiety, generalized 03/31/2015  . ADD (attention deficit disorder) 03/31/2015   Phillips Grout PT, DPT   Cydnie Deason 03/20/2017, 5:49 PM  [facility] [facility] [address], [place], [address] Phone: [phone]   Fax:  [phone]  Name: Joel Burgess MRN: [id number] Date of Birth: [birth year]

## 2017-03-25 ENCOUNTER — Encounter: Payer: Managed Care, Other (non HMO) | Admitting: Physical Therapy

## 2017-03-27 ENCOUNTER — Ambulatory Visit: Payer: Managed Care, Other (non HMO) | Admitting: Physical Therapy

## 2017-04-01 ENCOUNTER — Ambulatory Visit: Payer: Managed Care, Other (non HMO) | Attending: Physician Assistant

## 2017-04-01 DIAGNOSIS — M6281 Muscle weakness (generalized): Secondary | ICD-10-CM | POA: Insufficient documentation

## 2017-04-01 DIAGNOSIS — M25671 Stiffness of right ankle, not elsewhere classified: Secondary | ICD-10-CM | POA: Insufficient documentation

## 2017-04-01 DIAGNOSIS — M25571 Pain in right ankle and joints of right foot: Secondary | ICD-10-CM | POA: Insufficient documentation

## 2017-04-07 ENCOUNTER — Ambulatory Visit: Payer: Managed Care, Other (non HMO)

## 2017-04-09 ENCOUNTER — Encounter: Payer: Managed Care, Other (non HMO) | Admitting: Physical Therapy

## 2017-04-15 ENCOUNTER — Ambulatory Visit: Payer: Managed Care, Other (non HMO)

## 2017-04-15 DIAGNOSIS — M25671 Stiffness of right ankle, not elsewhere classified: Secondary | ICD-10-CM | POA: Diagnosis not present

## 2017-04-15 DIAGNOSIS — M6281 Muscle weakness (generalized): Secondary | ICD-10-CM | POA: Diagnosis present

## 2017-04-15 DIAGNOSIS — M25571 Pain in right ankle and joints of right foot: Secondary | ICD-10-CM | POA: Diagnosis present

## 2017-04-15 NOTE — Therapy (Signed)
[place] [facility] [address], [place], [address] Phone: [phone]   Fax:  [phone]  Physical Therapy Treatment  Patient Details  Name: Joel Burgess MRN: [id number] Date of Birth: [birth year] Referring Provider: [name]  Encounter Date: 04/15/2017      PT End of Session - 04/15/17 0846    Visit Number 3   Number of Visits 17   Date for PT Re-Evaluation 05/07/17   Authorization Type no g codes   PT Start Time 0815   PT Stop Time 0900   PT Time Calculation (min) 45 min   Activity Tolerance Patient tolerated treatment well   Behavior During Therapy [facility] for tasks assessed/performed      Past Medical History:  Diagnosis Date  . ADHD (attention deficit hyperactivity disorder)   . Anxiety     Past Surgical History:  Procedure Laterality Date  . APPENDECTOMY    . KNEE SURGERY Right     There were no vitals filed for this visit.      Subjective Assessment - 04/15/17 0825    Subjective Patient reports he's doing well and has difficulty with raising his heel on one foot.    Pertinent History Joel Burgess is a 41 y.o. male with h/o asthma, anxiety, and ADHD who underwent ORIF with IMN of a right closed tibia shaft fracture and pinning of the distal fibula fracture 08/05/2016 by Dr. Marlou Starks. His date of injury is 08/04/2016 when he was wrestling to control his 45 yo son, who has cognitive impairment. His son subsequently fell on him resulting in the injury. Following surgery, he went for physical therapy at [facility] from October through November 2017. He reports that after November he thought he could continue therapy on his own and was discharged. He continued to walk with a limp and have difficulty with his ankle range of motion and strength. He reports that about 3 weeks ago he had insidious onset of pain and popping in the right knee and ankle. He recently went back for a follow-up  with orthopedics at [facility] and they imaged his leg with well healing fractures and no concerns. They stated that he could return to activities without restriction or limitation. They suggested he restart therapy due to continued limitations in RLE strength and mobility. Script provided for physical therapy to work on ankle/knee motion, leg strengthening, walking, and balance.   Limitations Walking   Patient Stated Goals Walk without a limp, return to running, improve his ability to swim at the [facility]   Currently in Pain? No/denies       TREATMENT: Manual therapy: STM along lateral aspect of the ankle and along the plantar flexors to decrease spasms and pain. Performed A->P talocrual, P->A talocrucal, talocrual/subtalar distraction grade III/IV 3 x 30sec  Therapy Exercise: Standing unilateral heel raised off of a step -- 2 x 20 with unilateral support Single leg ball toss with 4# ball against rebounder -- 2 x 20  Squats with B UE support on Bosu -- x 20  Running man on airex pad with unilateral UE support -- x30          PT Education - 04/15/17 0846    Education provided Yes   Education Details Form/technique with exercise   Person(s) Educated Patient   Methods Demonstration;Explanation   Comprehension Verbalized understanding;Returned demonstration             PT Long Term Goals - 03/20/17 1321  PT LONG TERM GOAL #1   Title Pt will be independent with HEP in order to improve strength and balance in order to decrease fall risk and improve function at home and work.    Time 8   Period Weeks   Status On-going     PT LONG TERM GOAL #2   Title Pt will improve R hip abduction and extension strength to at least 4+/5 in order to improve gait mechanics and return to patient's goal of running and swimming   Baseline 03/12/17: 4-/5 for abduction and extension   Time 8   Period Weeks   Status On-going     PT LONG TERM GOAL #3   Title Pt will improve R ankle plantarflexion  strength so that he can perform at least 10 heel raises in standing in order to improve push off so that he can run   Baseline 03/12/17: unable to clear heel from ground   Time 8   Period Weeks   Status On-going     PT LONG TERM GOAL #4   Title Pt will improve R ankle DF AAROM in standing to at least 20 degrees in order to improve gait and running mechanics   Baseline 03/12/17: 14 degrees   Time 8   Period Weeks   Status On-going     PT LONG TERM GOAL #5   Title Pt will increase LEFS by at least 9 points in order to demonstrate significant improvement in lower extremity function.     Baseline 03/20/17: 51/80   Time 8   Period Weeks   Status New               Plan - 04/15/17 0847    Clinical Impression Statement Performed improved ankle function indicated by improved ability to perform single heel raises after performing manual therapy indicating improved motor control and coordination. Patient demonstrates singificant decrease in muscular strength as indicated by fatigue at end of session and patient will patient benefit from further skilled therapy to return to prior level of function.    Rehab Potential Good   Clinical Impairments Affecting Rehab Potential Positive: motivation, age; Negative: persistent weakness/ROM deficits   PT Frequency 2x / week   PT Duration 8 weeks   PT Treatment/Interventions ADLs/Self Care Home Management;Aquatic Therapy;Biofeedback;Cryotherapy;Electrical Stimulation;Iontophoresis 4mg /ml Dexamethasone;Moist Heat;DME Instruction;Gait training;Stair training;Functional mobility training;Therapeutic activities;Therapeutic exercise;Neuromuscular re-education;Balance training;Patient/family education;Manual techniques;Passive range of motion   PT Next Visit Plan  Measure R ankle AAROM DF in standing, Progress RLE strengthening, assess balance (SLS on RLE), mobilizations for R ankle DF, progress HEP as appropriate (consider progression from bridges to forward or  reverse lunges.   PT Home Exercise Plan Heel raises with increased weighting to R side, standing gastroc step stretch, standing soleus stretch, hooklying bridges, lateral lunges   Consulted and Agree with Plan of Care Patient      Patient will benefit from skilled therapeutic intervention in order to improve the following deficits and impairments:  Abnormal gait, Decreased strength, Difficulty walking, Hypomobility, Impaired flexibility, Pain  Visit Diagnosis: Stiffness of right ankle, not elsewhere classified  Muscle weakness (generalized)  Pain in right ankle and joints of right foot     Problem List Patient Active Problem List   Diagnosis Date Noted  . Anxiety, generalized 03/31/2015  . ADD (attention deficit disorder) 03/31/2015    Blythe Stanford, PT DPT 04/15/2017, 9:02 AM  [facility] [address], [place], [address]  Phone: [phone]   Fax:  [phone]  Name: Joel Burgess MRN: [id number] Date of Birth: 30-Jun-1976

## 2017-04-16 ENCOUNTER — Ambulatory Visit: Payer: Managed Care, Other (non HMO)

## 2017-04-16 DIAGNOSIS — M25671 Stiffness of right ankle, not elsewhere classified: Secondary | ICD-10-CM

## 2017-04-16 DIAGNOSIS — M25571 Pain in right ankle and joints of right foot: Secondary | ICD-10-CM

## 2017-04-16 DIAGNOSIS — M6281 Muscle weakness (generalized): Secondary | ICD-10-CM

## 2017-04-16 NOTE — Therapy (Signed)
[place] [facility] [address], [place], [address] Phone: [phone]   Fax:  [phone]  Physical Therapy Treatment  Patient Details  Name: Joel Burgess MRN: [id number] Date of Birth: 12-Jan-1976 Referring Provider: [name]  Encounter Date: 04/16/2017      PT End of Session - 04/16/17 0926    Visit Number 4   Number of Visits 17   Date for PT Re-Evaluation 05/07/17   Authorization Type no g codes   PT Start Time 0845   PT Stop Time 0930   PT Time Calculation (min) 45 min   Activity Tolerance Patient tolerated treatment well   Behavior During Therapy [name] for tasks assessed/performed      Past Medical History:  Diagnosis Date  . ADHD (attention deficit hyperactivity disorder)   . Anxiety     Past Surgical History:  Procedure Laterality Date  . APPENDECTOMY    . KNEE SURGERY Right     There were no vitals filed for this visit.      Subjective Assessment - 04/16/17 0919    Subjective Patient reports he's his ankle is improving and states he feel like he can walk easier after the previous.    Pertinent History Joel Burgess is a 41 y.o. male with h/o asthma, anxiety, and ADHD who underwent ORIF with IMN of a right closed tibia shaft fracture and pinning of the distal fibula fracture 08/05/2016 by Dr. Marlou Starks. His date of injury is 08/04/2016 when he was wrestling to control his 66 yo son, who has cognitive impairment. His son subsequently fell on him resulting in the injury. Following surgery, he went for physical therapy at [facility] from October through November 2017. He reports that after November he thought he could continue therapy on his own and was discharged. He continued to walk with a limp and have difficulty with his ankle range of motion and strength. He reports that about 3 weeks ago he had insidious onset of pain and popping in the right knee and ankle. He recently went  back for a follow-up with orthopedics at [facility] and they imaged his leg with well healing fractures and no concerns. They stated that he could return to activities without restriction or limitation. They suggested he restart therapy due to continued limitations in RLE strength and mobility. Script provided for physical therapy to work on ankle/knee motion, leg strengthening, walking, and balance.   Limitations Walking   Patient Stated Goals Walk without a limp, return to running, improve his ability to swim at the [facility]   Currently in Pain? No/denies      TREATMENT: Manual therapy: STM along lateral aspect of the ankle and along the plantar flexors to decrease spasms and pain. Performed A->P talocrual, P->A talocrucal, talocrual/subtalar distraction grade III/IV 3 x 30sec   Therapy Exercise: Plantarflexion in sitting - against double black band x25 Single leg stance ball toss -x25 BAPS board level 3 ant/post, lateral, cw/ccw - 1 min each direction Rotational ball taps on an airex - x25 each direction  Heel walking - 198ft with cueing on improving tibialis anterior activation Single leg ball toss with 4# ball against rebounder -- 2 x 20  Hops on the airex pad - x20 with B UE support Standing unilateral heel raised off of a step -- 2 x 20 with unilateral support         PT Education - 04/16/17 0926    Education provided Yes  Education Details Ant/physiology on ankle musuclature   Person(s) Educated Patient   Methods Explanation   Comprehension Verbalized understanding             PT Long Term Goals - 03/20/17 1321      PT LONG TERM GOAL #1   Title Pt will be independent with HEP in order to improve strength and balance in order to decrease fall risk and improve function at home and work.    Time 8   Period Weeks   Status On-going     PT LONG TERM GOAL #2   Title Pt will improve R hip abduction and extension strength to at least 4+/5 in order to improve gait mechanics and  return to patient's goal of running and swimming   Baseline 03/12/17: 4-/5 for abduction and extension   Time 8   Period Weeks   Status On-going     PT LONG TERM GOAL #3   Title Pt will improve R ankle plantarflexion strength so that he can perform at least 10 heel raises in standing in order to improve push off so that he can run   Baseline 03/12/17: unable to clear heel from ground   Time 8   Period Weeks   Status On-going     PT LONG TERM GOAL #4   Title Pt will improve R ankle DF AAROM in standing to at least 20 degrees in order to improve gait and running mechanics   Baseline 03/12/17: 14 degrees   Time 8   Period Weeks   Status On-going     PT LONG TERM GOAL #5   Title Pt will increase LEFS by at least 9 points in order to demonstrate significant improvement in lower extremity function.     Baseline 03/20/17: 51/80   Time 8   Period Weeks   Status New               Plan - 04/16/17 5956    Clinical Impression Statement Progressed exercises and focused on improving hip muscular endurance/coordination and balance in SLS. Patient demonstrates improvement in ankle fucntion today with ability to perform greater amount of exercise before fatigue indicating improvement in coordination and patient will benefit from further skilled therapy to return to prior level of function.    Rehab Potential Good   Clinical Impairments Affecting Rehab Potential Positive: motivation, age; Negative: persistent weakness/ROM deficits   PT Frequency 2x / week   PT Duration 8 weeks   PT Treatment/Interventions ADLs/Self Care Home Management;Aquatic Therapy;Biofeedback;Cryotherapy;Electrical Stimulation;Iontophoresis 4mg /ml Dexamethasone;Moist Heat;DME Instruction;Gait training;Stair training;Functional mobility training;Therapeutic activities;Therapeutic exercise;Neuromuscular re-education;Balance training;Patient/family education;Manual techniques;Passive range of motion   PT Next Visit Plan   Measure R ankle AAROM DF in standing, Progress RLE strengthening, assess balance (SLS on RLE), mobilizations for R ankle DF, progress HEP as appropriate (consider progression from bridges to forward or reverse lunges.   PT Home Exercise Plan Heel raises with increased weighting to R side, standing gastroc step stretch, standing soleus stretch, hooklying bridges, lateral lunges   Consulted and Agree with Plan of Care Patient      Patient will benefit from skilled therapeutic intervention in order to improve the following deficits and impairments:  Abnormal gait, Decreased strength, Difficulty walking, Hypomobility, Impaired flexibility, Pain  Visit Diagnosis: Stiffness of right ankle, not elsewhere classified  Muscle weakness (generalized)  Pain in right ankle and joints of right foot     Problem List Patient Active Problem List   Diagnosis Date Noted  .  Anxiety, generalized 03/31/2015  . ADD (attention deficit disorder) 03/31/2015    Blythe Stanford, PT DPT 04/16/2017, 9:34 AM  [facility] [address] [place], [address] Phone: [phone]   Fax:  [phone]  Name: Joel Burgess MRN: [id number] Date of Birth: 1976-07-05

## 2017-04-17 ENCOUNTER — Encounter: Payer: Managed Care, Other (non HMO) | Admitting: Physical Therapy

## 2017-04-22 ENCOUNTER — Ambulatory Visit: Payer: Managed Care, Other (non HMO) | Attending: Physician Assistant

## 2017-04-22 DIAGNOSIS — M6281 Muscle weakness (generalized): Secondary | ICD-10-CM | POA: Diagnosis present

## 2017-04-22 DIAGNOSIS — M25571 Pain in right ankle and joints of right foot: Secondary | ICD-10-CM | POA: Diagnosis present

## 2017-04-22 DIAGNOSIS — M25671 Stiffness of right ankle, not elsewhere classified: Secondary | ICD-10-CM | POA: Diagnosis not present

## 2017-04-22 NOTE — Therapy (Signed)
[facility] [address], [place], [address] Phone: [phone]   Fax:  [phone]  Physical Therapy Treatment  Patient Details  Name: [facility] MRN: [id number] Date of Birth: [birth year] Referring Provider: [name]  Encounter Date: 04/22/2017      PT End of Session - 04/22/17 0900    Visit Number 5   Number of Visits 17   Date for PT Re-Evaluation 05/07/17   Authorization Type no g codes   PT Start Time 0900   PT Stop Time 0930   PT Time Calculation (min) 30 min   Activity Tolerance Patient tolerated treatment well   Behavior During Therapy [facility]      Past Medical History:  Diagnosis Date  . ADHD (attention deficit hyperactivity disorder)   . Anxiety     Past Surgical History:  Procedure Laterality Date  . APPENDECTOMY    . KNEE SURGERY Right     There were no vitals filed for this visit.      Subjective Assessment - 04/22/17 0859    Subjective Patient reports he's been stretching his ankle at home to improve mobility. Patient reports he feels his ankle is slowly improving.    Pertinent History [facility] is a 41 y.o. male with h/o asthma, anxiety, and ADHD who underwent ORIF with IMN of a right closed tibia shaft fracture and pinning of the distal fibula fracture 08/05/2016 by Dr. Marlou Starks. His date of injury is 08/04/2016 when he was wrestling to control his 57 yo son, who has cognitive impairment. His son subsequently fell on him resulting in the injury. Following surgery, he went for physical therapy at [facility] from October through November 2017. He reports that after November he thought he could continue therapy on his own and was discharged. He continued to walk with a limp and have difficulty with his ankle range of motion and strength. He reports that about 3 weeks ago he had insidious onset of pain and popping in the right knee and  ankle. He recently went back for a follow-up with orthopedics at [facility] and they imaged his leg with well healing fractures and no concerns. They stated that he could return to activities without restriction or limitation. They suggested he restart therapy due to continued limitations in RLE strength and mobility. Script provided for physical therapy to work on ankle/knee motion, leg strengthening, walking, and balance.   Limitations Walking   Patient Stated Goals Walk without a limp, return to running, improve his ability to swim at the [facility]   Currently in Pain? No/denies         TREATMENT: Manual therapy: STM along dorsal aspect of the foot/ankle to decrease spasms and pain along toe extensor in the foot. Active Release techniques to the toe extensors 3 bouts of 30sec hold.  Performed A->P talocrual, P->A talocrucal, talocrual/subtalar distraction grade III/IV 3 x 30sec   Therapy Exercise: Heel walking - 110ft with cueing on improving tibialis anterior activation Toe walking with UE Resisted toe extension in long sitting with RTB - 2 x 25 Resisted great toe extension in long sitting with RTB - 2 x 25 Standing toe extension stretch on the R LE - 15 sec hold x 3  Standing heel raises in standing on Airex pad - B LE x 20; unilaterally on R LE x 20  Mobilization with movement TC joint - x 20  Closed Kinetic Chain knee flexion  against wall to improve ankle mobility - x 20  Improved gait with ambulation after performing mobilizations to the ankle.        PT Education - 04/22/17 0900    Education provided Yes   Education Details Reviewed HEP   Person(s) Educated Patient   Methods Explanation;Demonstration   Comprehension Verbalized understanding;Returned demonstration             PT Long Term Goals - 03/20/17 1321      PT LONG TERM GOAL #1   Title Pt will be independent with HEP in order to improve strength and balance in order to decrease fall risk and improve function at home  and work.    Time 8   Period Weeks   Status On-going     PT LONG TERM GOAL #2   Title Pt will improve R hip abduction and extension strength to at least 4+/5 in order to improve gait mechanics and return to patient's goal of running and swimming   Baseline 03/12/17: 4-/5 for abduction and extension   Time 8   Period Weeks   Status On-going     PT LONG TERM GOAL #3   Title Pt will improve R ankle plantarflexion strength so that he can perform at least 10 heel raises in standing in order to improve push off so that he can run   Baseline 03/12/17: unable to clear heel from ground   Time 8   Period Weeks   Status On-going     PT LONG TERM GOAL #4   Title Pt will improve R ankle DF AAROM in standing to at least 20 degrees in order to improve gait and running mechanics   Baseline 03/12/17: 14 degrees   Time 8   Period Weeks   Status On-going     PT LONG TERM GOAL #5   Title Pt will increase LEFS by at least 9 points in order to demonstrate significant improvement in lower extremity function.     Baseline 03/20/17: 51/80   Time 8   Period Weeks   Status New               Plan - 04/22/17 0935    Clinical Impression Statement Focused on decreasing muscle spasms and pain along the dorsal aspect of the foot. Patient reports improvement of pain after performing STM indicating decreased muscular spasms/pain. Focused on performing mobility and improving endurance in the foot/ankle to decrease risk of further ankle difficulty and decrease antalgic gait. Patient will benefit from further skilled therapy focused on improving mobility to return to prior level of function.    Rehab Potential Good   Clinical Impairments Affecting Rehab Potential Positive: motivation, age; Negative: persistent weakness/ROM deficits   PT Frequency 2x / week   PT Duration 8 weeks   PT Treatment/Interventions ADLs/Self Care Home Management;Aquatic Therapy;Biofeedback;Cryotherapy;Electrical  Stimulation;Iontophoresis 4mg /ml Dexamethasone;Moist Heat;DME Instruction;Gait training;Stair training;Functional mobility training;Therapeutic activities;Therapeutic exercise;Neuromuscular re-education;Balance training;Patient/family education;Manual techniques;Passive range of motion   PT Next Visit Plan  Measure R ankle AAROM DF in standing, Progress RLE strengthening, assess balance (SLS on RLE), mobilizations for R ankle DF, progress HEP as appropriate (consider progression from bridges to forward or reverse lunges.   PT Home Exercise Plan Heel raises with increased weighting to R side, standing gastroc step stretch, standing soleus stretch, hooklying bridges, lateral lunges   Consulted and Agree with Plan of Care Patient      Patient will benefit from skilled therapeutic intervention in order to improve the  following deficits and impairments:  Abnormal gait, Decreased strength, Difficulty walking, Hypomobility, Impaired flexibility, Pain  Visit Diagnosis: Stiffness of right ankle, not elsewhere classified  Muscle weakness (generalized)  Pain in right ankle and joints of right foot     Problem List Patient Active Problem List   Diagnosis Date Noted  . Anxiety, generalized 03/31/2015  . ADD (attention deficit disorder) 03/31/2015    Blythe Stanford, PT DPT 04/22/2017, 9:47 AM  [facility] [address] [place], [address] Phone: [phone]   Fax:  [phone]  Name: Kahleb Mcclane MRN: [id number] Date of Birth: Aug 24, 1976

## 2017-04-24 ENCOUNTER — Ambulatory Visit: Payer: Managed Care, Other (non HMO)

## 2017-04-28 ENCOUNTER — Ambulatory Visit: Payer: Managed Care, Other (non HMO)

## 2017-04-28 DIAGNOSIS — M25571 Pain in right ankle and joints of right foot: Secondary | ICD-10-CM

## 2017-04-28 DIAGNOSIS — M25671 Stiffness of right ankle, not elsewhere classified: Secondary | ICD-10-CM

## 2017-04-28 DIAGNOSIS — M6281 Muscle weakness (generalized): Secondary | ICD-10-CM

## 2017-04-28 NOTE — Therapy (Signed)
[facility] [address] [place], [address] Phone: [phone]   Fax:  [phone]  Physical Therapy Treatment  Patient Details  Name: [facility] MRN: [id number] Date of Birth: 1976/01/02 Referring Provider: [name]  Encounter Date: 04/28/2017      PT End of Session - 04/28/17 0825    Visit Number 6   Number of Visits 17   Date for PT Re-Evaluation 05/07/17   Authorization Type no g codes   PT Start Time 0800   PT Stop Time 0830   PT Time Calculation (min) 30 min   Activity Tolerance Patient tolerated treatment well   Behavior During Therapy [facility] for tasks assessed/performed      Past Medical History:  Diagnosis Date  . ADHD (attention deficit hyperactivity disorder)   . Anxiety     Past Surgical History:  Procedure Laterality Date  . APPENDECTOMY    . KNEE SURGERY Right     There were no vitals filed for this visit.      Subjective Assessment - 04/28/17 0823    Subjective Patient reports he's been working on his kitchen and states his ankle is improving and is able to move the joint more.    Pertinent History [facility] is a 41 y.o. male with h/o asthma, anxiety, and ADHD who underwent ORIF with IMN of a right closed tibia shaft fracture and pinning of the distal fibula fracture 08/05/2016 by Dr. Marlou Starks. His date of injury is 08/04/2016 when he was wrestling to control his 42 yo son, who has cognitive impairment. His son subsequently fell on him resulting in the injury. Following surgery, he went for physical therapy at [facility] from October through November 2017. He reports that after November he thought he could continue therapy on his own and was discharged. He continued to walk with a limp and have difficulty with his ankle range of motion and strength. He reports that about 3 weeks ago he had insidious onset of pain and popping in the right knee and ankle. He  recently went back for a follow-up with orthopedics at [facility] and they imaged his leg with well healing fractures and no concerns. They stated that he could return to activities without restriction or limitation. They suggested he restart therapy due to continued limitations in RLE strength and mobility. Script provided for physical therapy to work on ankle/knee motion, leg strengthening, walking, and balance.   Limitations Walking   Patient Stated Goals Walk without a limp, return to running, improve his ability to swim at the [facility]   Currently in Pain? No/denies        TREATMENT: Manual therapy: STM along dorsal aspect of the foot/ankle to decrease spasms and pain along toe extensor in the foot. Active Release techniques to the toe extensors 3 bouts of 30sec hold.  Performed A->P talocrual, P->A talocrucal, talocrual/subtalar distraction grade III/IV 3 x 30sec   Therapy Exercise: Heel walking - 18ft with cueing on improving tibialis anterior activation Toe walking with UE support - x 188ft Single leg stance rotational stance with rebounder - x20 Ant/post, lateral weight shifts on the large dynadisc - x25 Single leg stance on black side of bosu - 3 x 45sec    Improved weight bearing with ambulation after performing mobilizations to the ankle.       PT Education - 04/28/17 0825    Education provided Yes   Education Details form/technique with exercise  Person(s) Educated Patient   Methods Explanation;Demonstration   Comprehension Verbalized understanding;Returned demonstration             PT Long Term Goals - 03/20/17 1321      PT LONG TERM GOAL #1   Title Pt will be independent with HEP in order to improve strength and balance in order to decrease fall risk and improve function at home and work.    Time 8   Period Weeks   Status On-going     PT LONG TERM GOAL #2   Title Pt will improve R hip abduction and extension strength to at least 4+/5 in order to improve gait  mechanics and return to patient's goal of running and swimming   Baseline 03/12/17: 4-/5 for abduction and extension   Time 8   Period Weeks   Status On-going     PT LONG TERM GOAL #3   Title Pt will improve R ankle plantarflexion strength so that he can perform at least 10 heel raises in standing in order to improve push off so that he can run   Baseline 03/12/17: unable to clear heel from ground   Time 8   Period Weeks   Status On-going     PT LONG TERM GOAL #4   Title Pt will improve R ankle DF AAROM in standing to at least 20 degrees in order to improve gait and running mechanics   Baseline 03/12/17: 14 degrees   Time 8   Period Weeks   Status On-going     PT LONG TERM GOAL #5   Title Pt will increase LEFS by at least 9 points in order to demonstrate significant improvement in lower extremity function.     Baseline 03/20/17: 51/80   Time 8   Period Weeks   Status New               Plan - 04/28/17 5003    Clinical Impression Statement Patient demonstrates decreased pain with functional movements such as walking on his toes and heels. Although patient is improving with weigth acceptance and function of the LE, he continue to demonstrate increased pain and deceasd balance in SLS and patient will benefit from further skilled therapy to return to prior level of function.    Rehab Potential Good   Clinical Impairments Affecting Rehab Potential Positive: motivation, age; Negative: persistent weakness/ROM deficits   PT Frequency 2x / week   PT Duration 8 weeks   PT Treatment/Interventions ADLs/Self Care Home Management;Aquatic Therapy;Biofeedback;Cryotherapy;Electrical Stimulation;Iontophoresis 4mg /ml Dexamethasone;Moist Heat;DME Instruction;Gait training;Stair training;Functional mobility training;Therapeutic activities;Therapeutic exercise;Neuromuscular re-education;Balance training;Patient/family education;Manual techniques;Passive range of motion   PT Next Visit Plan  Measure R  ankle AAROM DF in standing, Progress RLE strengthening, assess balance (SLS on RLE), mobilizations for R ankle DF, progress HEP as appropriate (consider progression from bridges to forward or reverse lunges.   PT Home Exercise Plan Heel raises with increased weighting to R side, standing gastroc step stretch, standing soleus stretch, hooklying bridges, lateral lunges   Consulted and Agree with Plan of Care Patient      Patient will benefit from skilled therapeutic intervention in order to improve the following deficits and impairments:  Abnormal gait, Decreased strength, Difficulty walking, Hypomobility, Impaired flexibility, Pain  Visit Diagnosis: Stiffness of right ankle, not elsewhere classified  Muscle weakness (generalized)  Pain in right ankle and joints of right foot     Problem List Patient Active Problem List   Diagnosis Date Noted  . Anxiety, generalized  03/31/2015  . ADD (attention deficit disorder) 03/31/2015    Blythe Stanford, PT DPT 04/28/2017, 8:54 AM  [facility] [address], [place], [address] Phone: [phone]   Fax:  [phone]  Name: Lathen Seal MRN: [id number] Date of Birth: Apr 20, 1976

## 2017-05-01 ENCOUNTER — Ambulatory Visit: Payer: Managed Care, Other (non HMO)

## 2017-05-06 ENCOUNTER — Ambulatory Visit: Payer: Managed Care, Other (non HMO)

## 2017-05-06 DIAGNOSIS — M25571 Pain in right ankle and joints of right foot: Secondary | ICD-10-CM

## 2017-05-06 DIAGNOSIS — M25671 Stiffness of right ankle, not elsewhere classified: Secondary | ICD-10-CM | POA: Diagnosis not present

## 2017-05-06 DIAGNOSIS — M6281 Muscle weakness (generalized): Secondary | ICD-10-CM

## 2017-05-06 NOTE — Therapy (Signed)
[facility] [address] [place], [address] Phone: [phone]   Fax:  [phone]  Physical Therapy Treatment  Patient Details  Name: [facility] MRN: [id number] Date of Birth: 03/24/1976 Referring Provider: [name]  Encounter Date: 05/06/2017      PT End of Session - 05/06/17 0846    Visit Number 7   Number of Visits 17   Date for PT Re-Evaluation 05/07/17   Authorization Type no g codes   PT Start Time 0807   PT Stop Time 0845   PT Time Calculation (min) 38 min   Activity Tolerance Patient tolerated treatment well   Behavior During Therapy [facility] for tasks assessed/performed      Past Medical History:  Diagnosis Date  . ADHD (attention deficit hyperactivity disorder)   . Anxiety     Past Surgical History:  Procedure Laterality Date  . APPENDECTOMY    . KNEE SURGERY Right     There were no vitals filed for this visit.      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 Otherwise patient states his pain is improving.    Pertinent History [facility] is a 41 y.o. male with h/o asthma, anxiety, and ADHD who underwent ORIF with IMN of a right closed tibia shaft fracture and pinning of the distal fibula fracture 08/05/2016 by Dr. Marlou Starks. His date of injury is 08/04/2016 when he was wrestling to control his 2 yo son, who has cognitive impairment. His son subsequently fell on him resulting in the injury. Following surgery, he went for physical therapy at [facility] from October through November 2017. He reports that after November he thought he could continue therapy on his own and was discharged. He continued to walk with a limp and have difficulty with his ankle range of motion and strength. He reports that about 3 weeks ago he had insidious onset of pain and popping in the  right knee and ankle. He recently went back for a follow-up with orthopedics at [facility] and they imaged his leg with well healing fractures and no concerns. They stated that he could return to activities without restriction or limitation. They suggested he restart therapy due to continued limitations in RLE strength and mobility. Script provided for physical therapy to work on ankle/knee motion, leg strengthening, walking, and balance.   Limitations Walking   Patient Stated Goals Walk without a limp, return to running, improve his ability to swim at the [facility]   Currently in Pain? No/denies      TREATMENT: Manual therapy: STM along dorsal aspect of the foot/ankle to decrease spasms and pain along toe extensor in the foot and tibialis anterior. Active Release techniques to the toe extensors 3 bouts of 30sec hold.  Performed A->P talocrual, P->A talocrucal, talocrual/subtalar distraction grade III/IV 3 x 30sec.   Therapy Exercise: Heel walking - 177ft with cueing on improving tibialis anterior activation Toe walking with UE support - x 185ft Single leg stance with foot positioned in dorsiflexion with rebounder - 2 x20 R LE only  Dorsiflexion against BTB in sitting - 2 x 15  Ant/post, lateral weight shifts on dynadisc - x30 CKC Dorsiflexion at wall in standing - x20   Patient demonstrates increased fatigue at end of session most noted in the tibialis anterior.      PT Education -  05/06/17 0845    Education provided Yes   Education Details form/technique with exercise   Person(s) Educated Patient   Methods Explanation;Demonstration   Comprehension Verbalized understanding;Returned demonstration             PT Long Term Goals - 03/20/17 1321      PT LONG TERM GOAL #1   Title Pt will be independent with HEP in order to improve strength and balance in order to decrease fall risk and improve function at home and work.    Time 8   Period Weeks   Status On-going     PT LONG TERM GOAL #2    Title Pt will improve R hip abduction and extension strength to at least 4+/5 in order to improve gait mechanics and return to patient's goal of running and swimming   Baseline 03/12/17: 4-/5 for abduction and extension   Time 8   Period Weeks   Status On-going     PT LONG TERM GOAL #3   Title Pt will improve R ankle plantarflexion strength so that he can perform at least 10 heel raises in standing in order to improve push off so that he can run   Baseline 03/12/17: unable to clear heel from ground   Time 8   Period Weeks   Status On-going     PT LONG TERM GOAL #4   Title Pt will improve R ankle DF AAROM in standing to at least 20 degrees in order to improve gait and running mechanics   Baseline 03/12/17: 14 degrees   Time 8   Period Weeks   Status On-going     PT LONG TERM GOAL #5   Title Pt will increase LEFS by at least 9 points in order to demonstrate significant improvement in lower extremity function.     Baseline 03/20/17: 51/80   Time 8   Period Weeks   Status New               Plan - 05/06/17 0847    Clinical Impression Statement Patient demonstrates increased tibialis anterior dysfunction as indicated by TTP over the distal tendon/muscle belly and difficulty/ early onset of fatigue when performing dorsiflexion. Patient demonstrates improvement with  toe walking today indicating improvement with plantarflexion joint movement. Patient will benefit from further skilled therapy to return to prior level of function.    Rehab Potential Good   Clinical Impairments Affecting Rehab Potential Positive: motivation, age; Negative: persistent weakness/ROM deficits   PT Frequency 2x / week   PT Duration 8 weeks   PT Treatment/Interventions ADLs/Self Care Home Management;Aquatic Therapy;Biofeedback;Cryotherapy;Electrical Stimulation;Iontophoresis 4mg /ml Dexamethasone;Moist Heat;DME Instruction;Gait training;Stair training;Functional mobility training;Therapeutic  activities;Therapeutic exercise;Neuromuscular re-education;Balance training;Patient/family education;Manual techniques;Passive range of motion   PT Next Visit Plan  Measure R ankle AAROM DF in standing, Progress RLE strengthening, assess balance (SLS on RLE), mobilizations for R ankle DF, progress HEP as appropriate (consider progression from bridges to forward or reverse lunges.   PT Home Exercise Plan Heel raises with increased weighting to R side, standing gastroc step stretch, standing soleus stretch, hooklying bridges, lateral lunges   Consulted and Agree with Plan of Care Patient      Patient will benefit from skilled therapeutic intervention in order to improve the following deficits and impairments:  Abnormal gait, Decreased strength, Difficulty walking, Hypomobility, Impaired flexibility, Pain  Visit Diagnosis: Stiffness of right ankle, not elsewhere classified  Muscle weakness (generalized)  Pain in right ankle and joints of right foot  Problem List Patient Active Problem List   Diagnosis Date Noted  . Anxiety, generalized 03/31/2015  . ADD (attention deficit disorder) 03/31/2015    Blythe Stanford, PT DPT 05/06/2017, 8:57 AM  [facility] [address] [place], [address] Phone: [phone]   Fax:  [phone]  Name: [facility] MRN: [id number] Date of Birth: 1976-10-25

## 2017-05-08 ENCOUNTER — Ambulatory Visit: Payer: Managed Care, Other (non HMO)

## 2017-05-12 ENCOUNTER — Ambulatory Visit: Payer: Managed Care, Other (non HMO)

## 2017-05-12 DIAGNOSIS — M6281 Muscle weakness (generalized): Secondary | ICD-10-CM

## 2017-05-12 DIAGNOSIS — M25671 Stiffness of right ankle, not elsewhere classified: Secondary | ICD-10-CM | POA: Diagnosis not present

## 2017-05-12 DIAGNOSIS — M25571 Pain in right ankle and joints of right foot: Secondary | ICD-10-CM

## 2017-05-12 NOTE — Therapy (Signed)
[facility] [address] [place], [address] Phone: [phone]   Fax:  [phone]  Physical Therapy Treatment  Patient Details  Name: Joel Burgess MRN: [id number] Date of Birth: Dec 10, 1976 Referring Provider: Hildred Burgess  Encounter Date: 05/12/2017      PT End of Session - 05/12/17 0840    Visit Number 8   Number of Visits 17   Date for PT Re-Evaluation 06/09/17   Authorization Type no g codes   PT Start Time 0803   PT Stop Time 0845   PT Time Calculation (min) 42 min   Activity Tolerance Patient tolerated treatment well   Behavior During Therapy [facility] for tasks assessed/performed      Past Medical History:  Diagnosis Date  . ADHD (attention deficit hyperactivity disorder)   . Anxiety     Past Surgical History:  Procedure Laterality Date  . APPENDECTOMY    . KNEE SURGERY Right     There were no vitals filed for this visit.      Subjective Assessment - 05/12/17 0838    Subjective Patient reports he's been having less pain with walking and states he walks with less of a "limp". Patient states he feels he is improving.   Pertinent History [name] is a 41 y.o. male with h/o asthma, anxiety, and ADHD who underwent ORIF with IMN of a right closed tibia shaft fracture and pinning of the distal fibula fracture 08/05/2016 by Dr. Marlou Burgess. His date of injury is 08/04/2016 when he was wrestling to control his 21 yo son, who has cognitive impairment. His son subsequently fell on him resulting in the injury. Following surgery, he went for physical therapy at [facility] from October through November 2017. He reports that after November he thought he could continue therapy on his own and was discharged. He continued to walk with a limp and have difficulty with his ankle range of motion and strength. He reports that about 3 weeks ago he had insidious onset of pain and popping in the right  knee and ankle. He recently went back for a follow-up with orthopedics at [facility] and they imaged his leg with well healing fractures and no concerns. They stated that he could return to activities without restriction or limitation. They suggested he restart therapy due to continued limitations in RLE strength and mobility. Script provided for physical therapy to work on ankle/knee motion, leg strengthening, walking, and balance.   Limitations Walking   Patient Stated Goals Walk without a limp, return to running, improve his ability to swim at the [facility]   Currently in Pain? No/denies       TREATMENT: Manual therapy: STM along tibialis anterior of the foot/ankle to decrease spasms and pain. Active Release techniques to the tibialis anterior 3 bouts of 30sec hold.  Performed A->P talocrual, P->A talocrucal, talocrual/subtalar distraction grade III/IV 3 x 30sec.   Therapy Exercise: Single leg heel raises with B UE support - x25 B heel raises off of 4" step - 2 x25 Heel walking - 159ft with cueing on improving tibialis anterior activation Toe walking with UE support - x 145ft Bunny hops with UE support - 2 x 25reps  Forward and backward bunny hops - 2 x 25reps  Hip machine - 2 x 20 55#  Observation: Hip MMT: 4/5 for abd and ext Ankle CKC AAROM dorsiflexion in standing: 20deg LEFS: 59/80 Patient demonstrates increased fatigue at end of session most noted  in the tibialis anterior.        PT Education - 05/12/17 0840    Education provided Yes   Education Details form/technique with exercise; POC    Person(s) Educated Patient   Methods Explanation;Demonstration   Comprehension Verbalized understanding;Returned demonstration             PT Long Term Goals - 05/12/17 0841      PT LONG TERM GOAL #1   Title Pt will be independent with HEP in order to improve strength and balance in order to decrease fall risk and improve function at home and work.    Time 8   Period Weeks   Status  On-going     PT LONG TERM GOAL #2   Title Pt will improve R hip abduction and extension strength to at least 4+/5 in order to improve gait mechanics and return to patient's goal of running and swimming   Baseline 03/12/17: 4-/5 for abduction and extension; 05/12/17: 03/12/17: 4/5 for abduction and extension   Time 8   Period Weeks   Status On-going     PT LONG TERM GOAL #3   Title Pt will improve R ankle plantarflexion strength so that he can perform at least 10 heel raises in standing in order to improve push off so that he can run   Baseline 03/12/17: unable to clear heel from ground; 05/12/17: able to perform >10rep   Time 8   Period Weeks   Status Achieved     PT LONG TERM GOAL #4   Title Pt will improve R ankle DF AAROM in standing to at least 20 degrees in order to improve gait and running mechanics   Baseline 03/12/17: 14 degrees 05/12/17: 20deg   Time 8   Period Weeks   Status Achieved     PT LONG TERM GOAL #5   Title Pt will increase LEFS by at least 9 points in order to demonstrate significant improvement in lower extremity function.     Baseline 03/20/17: 51/80; 05/12/17: 59/80   Time 8   Period Weeks   Status On-going     Additional Long Term Goals   Additional Long Term Goals Yes     PT LONG TERM GOAL #6   Title Patient will be able to run 100 ft without pain to increase effiency at work and return to recreational activties.   Baseline unable to run without pain   Time 4   Period Weeks   Status New               Plan - 05/12/17 0258    Clinical Impression Statement Patient is making progress towards long term goals with singificant improvements in LEFS, hip strengthening, ankle AROM and walking ability. Patient demonstrates improvement with MMT with greater ability to tolerate manual therapy without increased pain. Although patient is improving, he continues to demonstrate increased pain with running/walking and will benefit from further skilled therapy to return  to prior level of function.    Rehab Potential Good   Clinical Impairments Affecting Rehab Potential Positive: motivation, age; Negative: persistent weakness/ROM deficits   PT Frequency 2x / week   PT Duration 8 weeks   PT Treatment/Interventions ADLs/Self Care Home Management;Aquatic Therapy;Biofeedback;Cryotherapy;Electrical Stimulation;Iontophoresis 4mg /ml Dexamethasone;Moist Heat;DME Instruction;Gait training;Stair training;Functional mobility training;Therapeutic activities;Therapeutic exercise;Neuromuscular re-education;Balance training;Patient/family education;Manual techniques;Passive range of motion   PT Next Visit Plan  Measure R ankle AAROM DF in standing, Progress RLE strengthening, assess balance (SLS on RLE), mobilizations for R ankle  DF, progress HEP as appropriate (consider progression from bridges to forward or reverse lunges.   PT Home Exercise Plan Heel raises with increased weighting to R side, standing gastroc step stretch, standing soleus stretch, hooklying bridges, lateral lunges   Consulted and Agree with Plan of Care Patient      Patient will benefit from skilled therapeutic intervention in order to improve the following deficits and impairments:  Abnormal gait, Decreased strength, Difficulty walking, Hypomobility, Impaired flexibility, Pain  Visit Diagnosis: Stiffness of right ankle, not elsewhere classified - Plan: PT plan of care cert/re-cert  Muscle weakness (generalized) - Plan: PT plan of care cert/re-cert  Pain in right ankle and joints of right foot - Plan: PT plan of care cert/re-cert     Problem List Patient Active Problem List   Diagnosis Date Noted  . Anxiety, generalized 03/31/2015  . ADD (attention deficit disorder) 03/31/2015    Blythe Stanford, PT DPT 05/12/2017, 8:57 AM  [facility] [address] [place], [address] Phone: [phone]   Fax:  [phone]  Name:  Joel Burgess MRN: [id number] Date of Birth: 1976-03-19

## 2017-05-15 ENCOUNTER — Ambulatory Visit: Payer: Managed Care, Other (non HMO)

## 2017-05-15 DIAGNOSIS — M25671 Stiffness of right ankle, not elsewhere classified: Secondary | ICD-10-CM | POA: Diagnosis not present

## 2017-05-15 DIAGNOSIS — M25571 Pain in right ankle and joints of right foot: Secondary | ICD-10-CM

## 2017-05-15 DIAGNOSIS — M6281 Muscle weakness (generalized): Secondary | ICD-10-CM

## 2017-05-15 NOTE — Therapy (Signed)
[facility] [address] [place], [address] Phone: [phone]   Fax:  [phone]  Physical Therapy Treatment  Patient Details  Name: Joel Burgess MRN: [id number] Date of Birth: 1976/10/18 Referring Provider: [name]  Encounter Date: 05/15/2017      PT End of Session - 05/15/17 0833    Visit Number 9   Number of Visits 17   Date for PT Re-Evaluation 06/09/17   Authorization Type no g codes   PT Start Time 0802   PT Stop Time 0845   PT Time Calculation (min) 43 min   Activity Tolerance Patient tolerated treatment well   Behavior During Therapy [facility] for tasks assessed/performed      Past Medical History:  Diagnosis Date  . ADHD (attention deficit hyperactivity disorder)   . Anxiety     Past Surgical History:  Procedure Laterality Date  . APPENDECTOMY    . KNEE SURGERY Right     There were no vitals filed for this visit.      Subjective Assessment - 05/15/17 0831    Subjective Patient reports he's had bilateral calf soreness and increased soreness along his tibialis anterior.    Pertinent History [name] is a 41 y.o. male with h/o asthma, anxiety, and ADHD who underwent ORIF with IMN of a right closed tibia shaft fracture and pinning of the distal fibula fracture 08/05/2016 by Dr. Marlou Starks. His date of injury is 08/04/2016 when he was wrestling to control his 13 yo son, who has cognitive impairment. His son subsequently fell on him resulting in the injury. Following surgery, he went for physical therapy at [facility] from October through November 2017. He reports that after November he thought he could continue therapy on his own and was discharged. He continued to walk with a limp and have difficulty with his ankle range of motion and strength. He reports that about 3 weeks ago he had insidious onset of pain and popping in the right knee and ankle. He recently went back for  a follow-up with orthopedics at [facility] and they imaged his leg with well healing fractures and no concerns. They stated that he could return to activities without restriction or limitation. They suggested he restart therapy due to continued limitations in RLE strength and mobility. Script provided for physical therapy to work on ankle/knee motion, leg strengthening, walking, and balance.   Limitations Walking   Patient Stated Goals Walk without a limp, return to running, improve his ability to swim at the [facility]   Currently in Pain? No/denies         TREATMENT: Manual therapy: STM along tibialis anterior of the foot/ankle to decrease spasms and pain. Active Release techniques to the tibialis anterior 3 bouts of 30sec hold.  Performed A->P talocrual, P->A talocrucal, talocrual/subtalar distraction grade III/IV 3 x 30sec. STM along plantarflexors with deep tissue lotion to decrease soreness around the area with patient long sitting   Therapy Exercise: Hip abduction in sidelying - 2 x 20 B Clamshells in sidelying - 2 x 20 B Hip extension in prone - x15 Patient demonstrates increased fatigue at end of session.        PT Education - 05/15/17 [phone]    Education provided Yes   Education Details form/technique with exercise   Person(s) Educated Patient   Methods Explanation;Demonstration   Comprehension Verbalized understanding;Returned demonstration             PT Long  Term Goals - 05/12/17 0841      PT LONG TERM GOAL #1   Title Pt will be independent with HEP in order to improve strength and balance in order to decrease fall risk and improve function at home and work.    Time 8   Period Weeks   Status On-going     PT LONG TERM GOAL #2   Title Pt will improve R hip abduction and extension strength to at least 4+/5 in order to improve gait mechanics and return to patient's goal of running and swimming   Baseline 03/12/17: 4-/5 for abduction and extension; 05/12/17: 03/12/17: 4/5 for  abduction and extension   Time 8   Period Weeks   Status On-going     PT LONG TERM GOAL #3   Title Pt will improve R ankle plantarflexion strength so that he can perform at least 10 heel raises in standing in order to improve push off so that he can run   Baseline 03/12/17: unable to clear heel from ground; 05/12/17: able to perform >10rep   Time 8   Period Weeks   Status Achieved     PT LONG TERM GOAL #4   Title Pt will improve R ankle DF AAROM in standing to at least 20 degrees in order to improve gait and running mechanics   Baseline 03/12/17: 14 degrees 05/12/17: 20deg   Time 8   Period Weeks   Status Achieved     PT LONG TERM GOAL #5   Title Pt will increase LEFS by at least 9 points in order to demonstrate significant improvement in lower extremity function.     Baseline 03/20/17: 51/80; 05/12/17: 59/80   Time 8   Period Weeks   Status On-going     Additional Long Term Goals   Additional Long Term Goals Yes     PT LONG TERM GOAL #6   Title Patient will be able to run 100 ft without pain to increase effiency at work and return to recreational activties.   Baseline unable to run without pain   Time 4   Period Weeks   Status New               Plan - 05/15/17 6333    Clinical Impression Statement Patient demonstrates increased soreness along his ankle musculature and calves and focused on improving hip strengthening to improve long term ankle stability. Patient demonstrates increased fatigue after performing hip abduction and external rotation indicating decreased muscular endurance and patient will benefit from further skilled therapy to return to prior level of function.    Rehab Potential Good   Clinical Impairments Affecting Rehab Potential Positive: motivation, age; Negative: persistent weakness/ROM deficits   PT Frequency 2x / week   PT Duration 8 weeks   PT Treatment/Interventions ADLs/Self Care Home Management;Aquatic Therapy;Biofeedback;Cryotherapy;Electrical  Stimulation;Iontophoresis 4mg /ml Dexamethasone;Moist Heat;DME Instruction;Gait training;Stair training;Functional mobility training;Therapeutic activities;Therapeutic exercise;Neuromuscular re-education;Balance training;Patient/family education;Manual techniques;Passive range of motion   PT Next Visit Plan  Measure R ankle AAROM DF in standing, Progress RLE strengthening, assess balance (SLS on RLE), mobilizations for R ankle DF, progress HEP as appropriate (consider progression from bridges to forward or reverse lunges.   PT Home Exercise Plan Heel raises with increased weighting to R side, standing gastroc step stretch, standing soleus stretch, hooklying bridges, lateral lunges   Consulted and Agree with Plan of Care Patient      Patient will benefit from skilled therapeutic intervention in order to improve the following deficits and  impairments:  Abnormal gait, Decreased strength, Difficulty walking, Hypomobility, Impaired flexibility, Pain  Visit Diagnosis: Stiffness of right ankle, not elsewhere classified  Muscle weakness (generalized)  Pain in right ankle and joints of right foot     Problem List Patient Active Problem List   Diagnosis Date Noted  . Anxiety, generalized 03/31/2015  . ADD (attention deficit disorder) 03/31/2015    Blythe Stanford, PT DPT 05/15/2017, 8:46 AM  [facility] [address] [place], [address] Phone: [phone]   Fax:  [phone]  Name: Joel Burgess MRN: [id number] Date of Birth: 24-Dec-1975

## 2017-05-21 ENCOUNTER — Ambulatory Visit: Payer: Managed Care, Other (non HMO)

## 2017-05-21 DIAGNOSIS — M25571 Pain in right ankle and joints of right foot: Secondary | ICD-10-CM

## 2017-05-21 DIAGNOSIS — M6281 Muscle weakness (generalized): Secondary | ICD-10-CM

## 2017-05-21 DIAGNOSIS — M25671 Stiffness of right ankle, not elsewhere classified: Secondary | ICD-10-CM | POA: Diagnosis not present

## 2017-05-21 NOTE — Therapy (Signed)
[facility] [address], [place], [address] Phone: [phone]   Fax:  [phone]  Physical Therapy Treatment  Patient Details  Name: Joel Burgess MRN: [id number] Date of Birth: 1976/08/14 Referring Provider: [name]  Encounter Date: 05/21/2017      PT End of Session - 05/21/17 0827    Visit Number 10   Number of Visits 17   Date for PT Re-Evaluation 06/09/17   Authorization Type no g codes   PT Start Time 0805   PT Stop Time 0845   PT Time Calculation (min) 40 min   Activity Tolerance Patient tolerated treatment well   Behavior During Therapy [facility] for tasks assessed/performed      Past Medical History:  Diagnosis Date  . ADHD (attention deficit hyperactivity disorder)   . Anxiety     Past Surgical History:  Procedure Laterality Date  . APPENDECTOMY    . KNEE SURGERY Right     There were no vitals filed for this visit.      Subjective Assessment - 05/21/17 0826    Subjective Patient reports he had increased soreness along his tibialis anterior the past week.   Pertinent History [name] is a 41 y.o. male with h/o asthma, anxiety, and ADHD who underwent ORIF with IMN of a right closed tibia shaft fracture and pinning of the distal fibula fracture 08/05/2016 by Dr. Marlou Starks. His date of injury is 08/04/2016 when he was wrestling to control his 46 yo son, who has cognitive impairment. His son subsequently fell on him resulting in the injury. Following surgery, he went for physical therapy at [place] from October through November 2017. He reports that after November he thought he could continue therapy on his own and was discharged. He continued to walk with a limp and have difficulty with his ankle range of motion and strength. He reports that about 3 weeks ago he had insidious onset of pain and popping in the right knee and ankle. He recently went back for a follow-up  with orthopedics at [facility] and they imaged his leg with well healing fractures and no concerns. They stated that he could return to activities without restriction or limitation. They suggested he restart therapy due to continued limitations in RLE strength and mobility. Script provided for physical therapy to work on ankle/knee motion, leg strengthening, walking, and balance.   Limitations Walking   Patient Stated Goals Walk without a limp, return to running, improve his ability to swim at the [facility]   Currently in Pain? No/denies      TREATMENT: Manual therapy: STM along tibialis anterior of the foot/ankle to decrease spasms and pain. Active Release techniques to the tibialis anterior 3 bouts of 30sec hold.  Performed A->P talocrual, P->A talocrucal, talocrual/subtalar distraction grade III/IV 3 x 30sec. STM along plantarflexors with deep tissue lotion to decrease soreness around the area with patient long sitting   Therapy Exercise: Resisted Dorsiflexion in long sitting - 2 x 10 against manual resistance SLS ball toss at rebounder -2 x 20 Dorsiflexion plantarflexion with single leg stance on dynadisc - x64min Hip Abduction, flexion, extension with stance foot on a airex pad - x 15 in all three directions  Hip machine - 2 x 20 40#  Patient demonstrates increased fatigue at end of session.       PT Education - 05/21/17 0827    Education provided Yes   Education Details form/technique with exercise  Person(s) Educated Patient   Methods Explanation;Demonstration   Comprehension Verbalized understanding;Returned demonstration             PT Long Term Goals - 05/12/17 0841      PT LONG TERM GOAL #1   Title Pt will be independent with HEP in order to improve strength and balance in order to decrease fall risk and improve function at home and work.    Time 8   Period Weeks   Status On-going     PT LONG TERM GOAL #2   Title Pt will improve R hip abduction and extension strength to  at least 4+/5 in order to improve gait mechanics and return to patient's goal of running and swimming   Baseline 03/12/17: 4-/5 for abduction and extension; 05/12/17: 03/12/17: 4/5 for abduction and extension   Time 8   Period Weeks   Status On-going     PT LONG TERM GOAL #3   Title Pt will improve R ankle plantarflexion strength so that he can perform at least 10 heel raises in standing in order to improve push off so that he can run   Baseline 03/12/17: unable to clear heel from ground; 05/12/17: able to perform >10rep   Time 8   Period Weeks   Status Achieved     PT LONG TERM GOAL #4   Title Pt will improve R ankle DF AAROM in standing to at least 20 degrees in order to improve gait and running mechanics   Baseline 03/12/17: 14 degrees 05/12/17: 20deg   Time 8   Period Weeks   Status Achieved     PT LONG TERM GOAL #5   Title Pt will increase LEFS by at least 9 points in order to demonstrate significant improvement in lower extremity function.     Baseline 03/20/17: 51/80; 05/12/17: 59/80   Time 8   Period Weeks   Status On-going     Additional Long Term Goals   Additional Long Term Goals Yes     PT LONG TERM GOAL #6   Title Patient will be able to run 100 ft without pain to increase effiency at work and return to recreational activties.   Baseline unable to run without pain   Time 4   Period Weeks   Status New               Plan - 05/21/17 9937    Clinical Impression Statement Patient demonstrates increased fatigue at end of session indicating decreased coordination and muscular endurance. Patient demonstrates increased stiffness after performing SLS activity indicating decreased coordination and patient will benefit from further skilled therapy to return to prior level of function.    Rehab Potential Good   Clinical Impairments Affecting Rehab Potential Positive: motivation, age; Negative: persistent weakness/ROM deficits   PT Frequency 2x / week   PT Duration 8 weeks    PT Treatment/Interventions ADLs/Self Care Home Management;Aquatic Therapy;Biofeedback;Cryotherapy;Electrical Stimulation;Iontophoresis 4mg /ml Dexamethasone;Moist Heat;DME Instruction;Gait training;Stair training;Functional mobility training;Therapeutic activities;Therapeutic exercise;Neuromuscular re-education;Balance training;Patient/family education;Manual techniques;Passive range of motion   PT Next Visit Plan  Measure R ankle AAROM DF in standing, Progress RLE strengthening, assess balance (SLS on RLE), mobilizations for R ankle DF, progress HEP as appropriate (consider progression from bridges to forward or reverse lunges.   PT Home Exercise Plan Heel raises with increased weighting to R side, standing gastroc step stretch, standing soleus stretch, hooklying bridges, lateral lunges   Consulted and Agree with Plan of Care Patient  Patient will benefit from skilled therapeutic intervention in order to improve the following deficits and impairments:  Abnormal gait, Decreased strength, Difficulty walking, Hypomobility, Impaired flexibility, Pain  Visit Diagnosis: Stiffness of right ankle, not elsewhere classified  Muscle weakness (generalized)  Pain in right ankle and joints of right foot     Problem List Patient Active Problem List   Diagnosis Date Noted  . Anxiety, generalized 03/31/2015  . ADD (attention deficit disorder) 03/31/2015    Blythe Stanford, PT DPT 05/21/2017, 8:39 AM  [facility] [address], [place], [address] Phone: [phone]   Fax:  [phone]  Name: Joel Burgess MRN: [id number] Date of Birth: 07-03-1976

## 2017-05-27 ENCOUNTER — Encounter: Payer: Self-pay | Admitting: Psychiatry

## 2017-05-27 ENCOUNTER — Ambulatory Visit (INDEPENDENT_AMBULATORY_CARE_PROVIDER_SITE_OTHER): Payer: Managed Care, Other (non HMO) | Admitting: Psychiatry

## 2017-05-27 VITALS — BP 119/75 | HR 60 | Temp 97.9°F | Wt 173.0 lb

## 2017-05-27 DIAGNOSIS — F902 Attention-deficit hyperactivity disorder, combined type: Secondary | ICD-10-CM | POA: Diagnosis not present

## 2017-05-27 DIAGNOSIS — F411 Generalized anxiety disorder: Secondary | ICD-10-CM

## 2017-05-27 MED ORDER — AMPHETAMINE-DEXTROAMPHETAMINE 30 MG PO TABS: 30.0000 mg | ORAL_TABLET | Freq: Two times a day (BID) | ORAL | 0 refills | Status: DC

## 2017-05-27 NOTE — Progress Notes (Signed)
Follow-up 41 year old man with ADHD. No new complaints. Mood has been stable. Sleeping well. No physical problems. Concentration and focus are well maintained. No sign of psychosis or agitation. His family is getting along well.  Neatly dressed and groomed. Good eye contact. Calm behavior appropriate interaction. No sign of dangerousness.  Continue current medicine 3 prescriptions written for 3 consecutive months. Follow-up 3 months.

## 2017-05-28 ENCOUNTER — Ambulatory Visit: Payer: Managed Care, Other (non HMO) | Attending: Physician Assistant

## 2017-05-28 DIAGNOSIS — M6281 Muscle weakness (generalized): Secondary | ICD-10-CM | POA: Insufficient documentation

## 2017-05-28 DIAGNOSIS — M25571 Pain in right ankle and joints of right foot: Secondary | ICD-10-CM | POA: Insufficient documentation

## 2017-05-28 DIAGNOSIS — M25671 Stiffness of right ankle, not elsewhere classified: Secondary | ICD-10-CM | POA: Insufficient documentation

## 2017-06-02 ENCOUNTER — Ambulatory Visit: Payer: Managed Care, Other (non HMO)

## 2017-06-04 ENCOUNTER — Ambulatory Visit: Payer: Managed Care, Other (non HMO)

## 2017-06-04 DIAGNOSIS — M25671 Stiffness of right ankle, not elsewhere classified: Secondary | ICD-10-CM | POA: Diagnosis present

## 2017-06-04 DIAGNOSIS — M6281 Muscle weakness (generalized): Secondary | ICD-10-CM | POA: Diagnosis present

## 2017-06-04 DIAGNOSIS — M25571 Pain in right ankle and joints of right foot: Secondary | ICD-10-CM

## 2017-06-04 NOTE — Therapy (Signed)
[facility] [address] [place], [address] Phone: [phone]   Fax:  [phone]  Physical Therapy Treatment  Patient Details  Name: Joel Burgess MRN: [id number] Date of Birth: Mar 02, 1976 Referring Provider: [name]  Encounter Date: 06/04/2017      PT End of Session - 06/04/17 0827    Visit Number 11   Number of Visits 17   Date for PT Re-Evaluation 06/09/17   Authorization Type no g codes   PT Start Time 0804   PT Stop Time 0845   PT Time Calculation (min) 41 min   Activity Tolerance Patient tolerated treatment well   Behavior During Therapy [facility] for tasks assessed/performed      Past Medical History:  Diagnosis Date  . ADHD (attention deficit hyperactivity disorder)   . Anxiety     Past Surgical History:  Procedure Laterality Date  . APPENDECTOMY    . KNEE SURGERY Right     There were no vitals filed for this visit.      Subjective Assessment - 06/04/17 0807    Subjective Patient reports he's been performing the duck walking exercise over the past few weeks. Patient reports he is still unable to swim secondary to pain.    Pertinent History Mr. [name] is a 41 y.o. male with h/o asthma, anxiety, and ADHD who underwent ORIF with IMN of a right closed tibia shaft fracture and pinning of the distal fibula fracture 08/05/2016 by Dr. Marlou Starks. His date of injury is 08/04/2016 when he was wrestling to control his 41 yo son, who has cognitive impairment. His son subsequently fell on him resulting in the injury. Following surgery, he went for physical therapy at [place] from October through November 2017. He reports that after November he thought he could continue therapy on his own and was discharged. He continued to walk with a limp and have difficulty with his ankle range of motion and strength. He reports that about 3 weeks ago he had insidious onset of pain and popping in  the right knee and ankle. He recently went back for a follow-up with orthopedics at [facility] and they imaged his leg with well healing fractures and no concerns. They stated that he could return to activities without restriction or limitation. They suggested he restart therapy due to continued limitations in RLE strength and mobility. Script provided for physical therapy to work on ankle/knee motion, leg strengthening, walking, and balance.   Limitations Walking   Patient Stated Goals Walk without a limp, return to running, improve his ability to swim at the [facility]   Currently in Pain? No/denies      TREATMENT: Manual therapy: STM along tibialis anterior of the foot/ankle to decrease spasms and pain. Active Release techniques to the tibialis anterior 3 bouts of 30sec hold.  Performed A->P talocrual, P->A talocrucal, talocrual/subtalar distraction grade III/IV 3 x 30sec.    Therapy Exercise: Resisted plantarflexion - x 5 with 5 sec holds to improve dorsiflexion  Heel raises in standing - x 20 Knee bent CKC dorsiflexion stretch - x15 Plantarflexion/dorsiflexion in standing -- 2 x 46min SLS with foot on Bosu ball black side up - 3x30sec with ball toss on last two sets  Running Man on Bosu ball - x 15  Dry Needling (22min unbilled): (2) 53mm x .19mm needles placed along patients R peroneal longus muscle to decrease pain and spasms in the ankle. Patient was educated on bee  Patient demonstrates decreased pain and spasms after performing dry needling       PT Education - 06/04/17 0827    Education provided Yes   Education Details form/technique with exercise   Person(s) Educated Patient   Methods Explanation;Demonstration   Comprehension Verbalized understanding;Returned demonstration             PT Long Term Goals - 05/12/17 0841      PT LONG TERM GOAL #1   Title Pt will be independent with HEP in order to improve strength and balance in order to decrease fall risk and improve  function at home and work.    Time 8   Period Weeks   Status On-going     PT LONG TERM GOAL #2   Title Pt will improve R hip abduction and extension strength to at least 4+/5 in order to improve gait mechanics and return to patient's goal of running and swimming   Baseline 03/12/17: 4-/5 for abduction and extension; 05/12/17: 03/12/17: 4/5 for abduction and extension   Time 8   Period Weeks   Status On-going     PT LONG TERM GOAL #3   Title Pt will improve R ankle plantarflexion strength so that he can perform at least 10 heel raises in standing in order to improve push off so that he can run   Baseline 03/12/17: unable to clear heel from ground; 05/12/17: able to perform >10rep   Time 8   Period Weeks   Status Achieved     PT LONG TERM GOAL #4   Title Pt will improve R ankle DF AAROM in standing to at least 20 degrees in order to improve gait and running mechanics   Baseline 03/12/17: 14 degrees 05/12/17: 20deg   Time 8   Period Weeks   Status Achieved     PT LONG TERM GOAL #5   Title Pt will increase LEFS by at least 9 points in order to demonstrate significant improvement in lower extremity function.     Baseline 03/20/17: 51/80; 05/12/17: 59/80   Time 8   Period Weeks   Status On-going     Additional Long Term Goals   Additional Long Term Goals Yes     PT LONG TERM GOAL #6   Title Patient will be able to run 100 ft without pain to increase effiency at work and return to recreational activties.   Baseline unable to run without pain   Time 4   Period Weeks   Status New               Plan - 06/04/17 1610    Clinical Impression Statement Patient demonstrates decreased pain in the ankle post performing manual therapy and dry needling indicating decreased muscle guarding and spasms. Patient demonstrates improvement with single leg heel raise indicating good progression to running but conitnues to have increased soreness after performing exercises. Patient will benefit from  further skilled therapy focused on improving ankle strength and endurance to return to prior level of function.    Rehab Potential Good   Clinical Impairments Affecting Rehab Potential Positive: motivation, age; Negative: persistent weakness/ROM deficits   PT Frequency 2x / week   PT Duration 8 weeks   PT Treatment/Interventions ADLs/Self Care Home Management;Aquatic Therapy;Biofeedback;Cryotherapy;Electrical Stimulation;Iontophoresis 4mg /ml Dexamethasone;Moist Heat;DME Instruction;Gait training;Stair training;Functional mobility training;Therapeutic activities;Therapeutic exercise;Neuromuscular re-education;Balance training;Patient/family education;Manual techniques;Passive range of motion   PT Next Visit Plan  Measure R ankle AAROM DF in standing, Progress RLE strengthening, assess balance (SLS on  RLE), mobilizations for R ankle DF, progress HEP as appropriate (consider progression from bridges to forward or reverse lunges.   PT Home Exercise Plan Heel raises with increased weighting to R side, standing gastroc step stretch, standing soleus stretch, hooklying bridges, lateral lunges   Consulted and Agree with Plan of Care Patient      Patient will benefit from skilled therapeutic intervention in order to improve the following deficits and impairments:  Abnormal gait, Decreased strength, Difficulty walking, Hypomobility, Impaired flexibility, Pain  Visit Diagnosis: Stiffness of right ankle, not elsewhere classified  Muscle weakness (generalized)  Pain in right ankle and joints of right foot     Problem List Patient Active Problem List   Diagnosis Date Noted  . Anxiety, generalized 03/31/2015  . ADD (attention deficit disorder) 03/31/2015    Blythe Stanford, PT DPT 06/04/2017, 8:50 AM  [facility] [address] [place], [address] Phone: [phone]   Fax:  [phone]  Name: Ihan Pat MRN:  [id number] Date of Birth: [birth year]

## 2017-06-09 ENCOUNTER — Ambulatory Visit: Payer: Managed Care, Other (non HMO)

## 2017-06-09 DIAGNOSIS — M6281 Muscle weakness (generalized): Secondary | ICD-10-CM | POA: Diagnosis not present

## 2017-06-09 DIAGNOSIS — M25571 Pain in right ankle and joints of right foot: Secondary | ICD-10-CM

## 2017-06-09 DIAGNOSIS — M25671 Stiffness of right ankle, not elsewhere classified: Secondary | ICD-10-CM

## 2017-06-09 NOTE — Therapy (Signed)
[facility] [address], [place], [address] Phone: [phone]   Fax:  [phone]  Physical Therapy Treatment  Patient Details  Name: Joel Burgess MRN: [id number] Date of Birth: 1976/05/09 Referring Provider: [name]  Encounter Date: 06/09/2017      PT End of Session - 06/09/17 0939    Visit Number 12   Number of Visits 17   Date for PT Re-Evaluation 06/09/17   Authorization Type no g codes   PT Start Time 0902   PT Stop Time 0945   PT Time Calculation (min) 43 min   Activity Tolerance Patient tolerated treatment well   Behavior During Therapy [name] for tasks assessed/performed      Past Medical History:  Diagnosis Date  . ADHD (attention deficit hyperactivity disorder)   . Anxiety     Past Surgical History:  Procedure Laterality Date  . APPENDECTOMY    . KNEE SURGERY Right     There were no vitals filed for this visit.      Subjective Assessment - 06/09/17 0934    Subjective Patient reports his ankle has been improving and is able to walk for longer periods of time without increase in pain.    Pertinent History Joel Burgess is a 41 y.o. male with h/o asthma, anxiety, and ADHD who underwent ORIF with IMN of a right closed tibia shaft fracture and pinning of the distal fibula fracture 08/05/2016 by Dr. Marlou Starks. His date of injury is 08/04/2016 when he was wrestling to control his 5 yo son, who has cognitive impairment. His son subsequently fell on him resulting in the injury. Following surgery, he went for physical therapy at [place] from October through November 2017. He reports that after November he thought he could continue therapy on his own and was discharged. He continued to walk with a limp and have difficulty with his ankle range of motion and strength. He reports that about 3 weeks ago he had insidious onset of pain and popping in the right knee and ankle. He  recently went back for a follow-up with orthopedics at [facility] and they imaged his leg with well healing fractures and no concerns. They stated that he could return to activities without restriction or limitation. They suggested he restart therapy due to continued limitations in RLE strength and mobility. Script provided for physical therapy to work on ankle/knee motion, leg strengthening, walking, and balance.   Limitations Walking   Patient Stated Goals Walk without a limp, return to running, improve his ability to swim at the [facility]   Currently in Pain? No/denies      TREATMENT: Manual therapy: STM along tibialis anterior, flexor digitorum/tibialis posterior, and peroneal of the foot/ankle to decrease spasms and pain. Performed A->P talocrual, P->A talocrucal, talocrual/subtalar distraction grade IV 3 x 30sec.    Therapy Exercise: Plantarflexion/dorsiflexion in standing on bosu ball - x30 Single leg stance rotations on airex pad - x30 B  Inversion/eversion; plantarflexion/dorsiflexion on dynadisc - x 30 on R LE  Inversion against resistance at OMEGA in sitting - 5# x 40 reps Inversion against gravity with 5# ankle weight - x30   Dry Needling (25min unbilled): (1) 180mm x .101mm needles placed along patients R deep flexor digitorum longus/posterior tib  muscle to decrease pain and spasms in the ankle. Patient was educated on benefits and risks of dry needling   Patient demonstrates decreased pain and spasms after performing dry needling and manual  therapy        PT Education - 06/09/17 0936    Education provided Yes   Education Details form/technique with exercise   Person(s) Educated Patient   Methods Explanation;Demonstration   Comprehension Verbalized understanding;Returned demonstration             PT Long Term Goals - 05/12/17 0841      PT LONG TERM GOAL #1   Title Pt will be independent with HEP in order to improve strength and balance in order to decrease fall risk and  improve function at home and work.    Time 8   Period Weeks   Status On-going     PT LONG TERM GOAL #2   Title Pt will improve R hip abduction and extension strength to at least 4+/5 in order to improve gait mechanics and return to patient's goal of running and swimming   Baseline 03/12/17: 4-/5 for abduction and extension; 05/12/17: 03/12/17: 4/5 for abduction and extension   Time 8   Period Weeks   Status On-going     PT LONG TERM GOAL #3   Title Pt will improve R ankle plantarflexion strength so that he can perform at least 10 heel raises in standing in order to improve push off so that he can run   Baseline 03/12/17: unable to clear heel from ground; 05/12/17: able to perform >10rep   Time 8   Period Weeks   Status Achieved     PT LONG TERM GOAL #4   Title Pt will improve R ankle DF AAROM in standing to at least 20 degrees in order to improve gait and running mechanics   Baseline 03/12/17: 14 degrees 05/12/17: 20deg   Time 8   Period Weeks   Status Achieved     PT LONG TERM GOAL #5   Title Pt will increase LEFS by at least 9 points in order to demonstrate significant improvement in lower extremity function.     Baseline 03/20/17: 51/80; 05/12/17: 59/80   Time 8   Period Weeks   Status On-going     Additional Long Term Goals   Additional Long Term Goals Yes     PT LONG TERM GOAL #6   Title Patient will be able to run 100 ft without pain to increase effiency at work and return to recreational activties.   Baseline unable to run without pain   Time 4   Period Weeks   Status New               Plan - 06/09/17 0941    Clinical Impression Statement Patient demonstratres weakne ankle inverters on the affected side and focused therapy on improving ankle strength in this direction to allow for improvement in ankle mobility. Patient demonstrates decreased pain after performing manual therapy and dry needling (patient verbally consented to dry needling) and patient will benefit  from further skilled therapy to return to prior level of function.    Rehab Potential Good   Clinical Impairments Affecting Rehab Potential Positive: motivation, age; Negative: persistent weakness/ROM deficits   PT Frequency 2x / week   PT Duration 8 weeks   PT Treatment/Interventions ADLs/Self Care Home Management;Aquatic Therapy;Biofeedback;Cryotherapy;Electrical Stimulation;Iontophoresis 4mg /ml Dexamethasone;Moist Heat;DME Instruction;Gait training;Stair training;Functional mobility training;Therapeutic activities;Therapeutic exercise;Neuromuscular re-education;Balance training;Patient/family education;Manual techniques;Passive range of motion   PT Next Visit Plan  Measure R ankle AAROM DF in standing, Progress RLE strengthening, assess balance (SLS on RLE), mobilizations for R ankle DF, progress HEP as appropriate (consider progression from  bridges to forward or reverse lunges.   PT Home Exercise Plan Heel raises with increased weighting to R side, standing gastroc step stretch, standing soleus stretch, hooklying bridges, lateral lunges   Consulted and Agree with Plan of Care Patient      Patient will benefit from skilled therapeutic intervention in order to improve the following deficits and impairments:  Abnormal gait, Decreased strength, Difficulty walking, Hypomobility, Impaired flexibility, Pain  Visit Diagnosis: Stiffness of right ankle, not elsewhere classified  Muscle weakness (generalized)  Pain in right ankle and joints of right foot     Problem List Patient Active Problem List   Diagnosis Date Noted  . Anxiety, generalized 03/31/2015  . ADD (attention deficit disorder) 03/31/2015    Blythe Stanford, PT DPT 06/09/2017, 9:48 AM  [facility] [address], [place], [address] Phone: [phone]   Fax:  [phone]  Name: Joel Burgess MRN: [id number] Date of Birth: 01/14/1976

## 2017-06-11 ENCOUNTER — Ambulatory Visit: Payer: Managed Care, Other (non HMO)

## 2017-06-11 DIAGNOSIS — M6281 Muscle weakness (generalized): Secondary | ICD-10-CM | POA: Diagnosis not present

## 2017-06-11 DIAGNOSIS — M25571 Pain in right ankle and joints of right foot: Secondary | ICD-10-CM

## 2017-06-11 DIAGNOSIS — M25671 Stiffness of right ankle, not elsewhere classified: Secondary | ICD-10-CM

## 2017-06-11 NOTE — Therapy (Signed)
[facility] [address], [place], [address] Phone: [phone]   Fax:  [phone]  Physical Therapy Treatment  Patient Details  Name: Joel Burgess MRN: [id number] Date of Birth: 10-Nov-1976 Referring Provider: [name]  Encounter Date: 06/11/2017      PT End of Session - 06/11/17 1743    Visit Number 13   Number of Visits 17   Date for PT Re-Evaluation 07/09/17   Authorization Type no g codes   PT Start Time 0034   PT Stop Time 1740   PT Time Calculation (min) 25 min   Activity Tolerance Patient tolerated treatment well   Behavior During Therapy [facility] for tasks assessed/performed      Past Medical History:  Diagnosis Date  . ADHD (attention deficit hyperactivity disorder)   . Anxiety     Past Surgical History:  Procedure Laterality Date  . APPENDECTOMY    . KNEE SURGERY Right     There were no vitals filed for this visit.      Subjective Assessment - 06/11/17 1742    Subjective Patient reports he has not had to work and his ankle is feeling better. Patient reports he continues to have difficulty walking.    Pertinent History Mr. [name] is a 41 y.o. male with h/o asthma, anxiety, and ADHD who underwent ORIF with IMN of a right closed tibia shaft fracture and pinning of the distal fibula fracture 08/05/2016 by Dr. Marlou Starks. His date of injury is 08/04/2016 when he was wrestling to control his 55 yo son, who has cognitive impairment. His son subsequently fell on him resulting in the injury. Following surgery, he went for physical therapy at [place] from October through November 2017. He reports that after November he thought he could continue therapy on his own and was discharged. He continued to walk with a limp and have difficulty with his ankle range of motion and strength. He reports that about 3 weeks ago he had insidious onset of pain and popping in the right knee and  ankle. He recently went back for a follow-up with orthopedics at [facility] and they imaged his leg with well healing fractures and no concerns. They stated that he could return to activities without restriction or limitation. They suggested he restart therapy due to continued limitations in RLE strength and mobility. Script provided for physical therapy to work on ankle/knee motion, leg strengthening, walking, and balance.   Limitations Walking   Patient Stated Goals Walk without a limp, return to running, improve his ability to swim at the [facility]   Currently in Pain? No/denies      TREATMENT: Manual therapy: STM along tibialis anterior, flexor digitorum/tibialis posterior, and peroneal of the foot/ankle to decrease spasms and pain. Performed A->P talocrual, P->A talocrucal, talocrual/subtalar distraction grade IV 3 x 30sec.    Therapy Exercise: Running back and forth - x15 Single leg stance heel raises - 2 x 15  Inversion - x 5 in supine   Dry Needling (29min unbilled): (1) 131mm x .66mm needles placed along patients R deep flexor digitorum longus/posterior tib  muscle to decrease pain and spasms in the ankle. Patient was educated on benefits and risks of dry needling and patient verbally consents    Patient demonstrates decreased pain and spasms after performing dry needling and manual therapy  Observation: Hip abduction/ext: 4+/5 LEFS: 60/80       PT Education - 06/11/17 1743    Education  provided Yes   Education Details Form/technique with exercise, POC   Person(s) Educated Patient   Methods Explanation;Demonstration   Comprehension Verbalized understanding;Returned demonstration             PT Long Term Goals - 06/11/17 1746      PT LONG TERM GOAL #1   Title Pt will be independent with HEP in order to improve strength and balance in order to decrease fall risk and improve function at home and work.    Time 8   Period Weeks   Status On-going     PT LONG TERM GOAL #2    Title Pt will improve R hip abduction and extension strength to at least 4+/5 in order to improve gait mechanics and return to patient's goal of running and swimming   Baseline 03/12/17: 4-/5 for abduction and extension; 05/12/17: 03/12/17: 4/5 for abduction and extension; 4+/5 for both movements   Time 8   Period Weeks   Status Achieved     PT LONG TERM GOAL #3   Title Pt will improve R ankle plantarflexion strength so that he can perform at least 10 heel raises in standing in order to improve push off so that he can run   Baseline 03/12/17: unable to clear heel from ground; 05/12/17: able to perform >10rep   Time 8   Period Weeks   Status Achieved     PT LONG TERM GOAL #4   Title Pt will improve R ankle DF AAROM in standing to at least 20 degrees in order to improve gait and running mechanics   Baseline 03/12/17: 14 degrees 05/12/17: 20deg   Time 8   Period Weeks   Status Achieved     PT LONG TERM GOAL #5   Title Pt will increase LEFS by at least 9 points in order to demonstrate significant improvement in lower extremity function.     Baseline 03/20/17: 51/80; 05/12/17: 59/80 06/11/2017 60/80   Time 8   Period Weeks   Status Achieved     Additional Long Term Goals   Additional Long Term Goals Yes     PT LONG TERM GOAL #6   Title Patient will be able to run 100 ft without pain to increase effiency at work and return to recreational activties.   Baseline unable to run without pain; Able to run but increased pain with performance    Time 4   Period Weeks   Status New     PT LONG TERM GOAL #7   Title Patient will be able to perform single leg heel raise on the R to improve running ability   Baseline unable to perform   Time 4   Period Weeks   Status New               Plan - 06/11/17 1744    Clinical Impression Statement Patient had to leave early secondary to family committments. Patient demonstrates improvement in LEFS scores and is able to run but deonstrates poor motor  control and gait pattern with performance. Patient demonstrates decreased plantarflexion strength and patient will benefit from further skilled threapy to return to prior level of function.    Rehab Potential Good   Clinical Impairments Affecting Rehab Potential Positive: motivation, age; Negative: persistent weakness/ROM deficits   PT Frequency 2x / week   PT Duration 8 weeks   PT Treatment/Interventions ADLs/Self Care Home Management;Aquatic Therapy;Biofeedback;Cryotherapy;Electrical Stimulation;Iontophoresis 4mg /ml Dexamethasone;Moist Heat;DME Instruction;Gait training;Stair training;Functional mobility training;Therapeutic activities;Therapeutic exercise;Neuromuscular re-education;Balance training;Patient/family education;Manual  techniques;Passive range of motion   PT Next Visit Plan  Measure R ankle AAROM DF in standing, Progress RLE strengthening, assess balance (SLS on RLE), mobilizations for R ankle DF, progress HEP as appropriate (consider progression from bridges to forward or reverse lunges.   PT Home Exercise Plan Heel raises with increased weighting to R side, standing gastroc step stretch, standing soleus stretch, hooklying bridges, lateral lunges   Consulted and Agree with Plan of Care Patient      Patient will benefit from skilled therapeutic intervention in order to improve the following deficits and impairments:  Abnormal gait, Decreased strength, Difficulty walking, Hypomobility, Impaired flexibility, Pain  Visit Diagnosis: Stiffness of right ankle, not elsewhere classified - Plan: PT plan of care cert/re-cert  Muscle weakness (generalized) - Plan: PT plan of care cert/re-cert  Pain in right ankle and joints of right foot - Plan: PT plan of care cert/re-cert     Problem List Patient Active Problem List   Diagnosis Date Noted  . Anxiety, generalized 03/31/2015  . ADD (attention deficit disorder) 03/31/2015    Blythe Stanford, PT DPT 06/11/2017, 5:54 PM  [facility] [address], [place], [address] Phone: [phone]   Fax:  [phone]  Name: Craven Crean MRN: [id number] Date of Birth: 08/19/1976

## 2017-06-16 ENCOUNTER — Ambulatory Visit: Payer: Managed Care, Other (non HMO)

## 2017-06-18 ENCOUNTER — Ambulatory Visit: Payer: Managed Care, Other (non HMO)

## 2017-06-18 DIAGNOSIS — M6281 Muscle weakness (generalized): Secondary | ICD-10-CM

## 2017-06-18 DIAGNOSIS — M25571 Pain in right ankle and joints of right foot: Secondary | ICD-10-CM

## 2017-06-18 DIAGNOSIS — M25671 Stiffness of right ankle, not elsewhere classified: Secondary | ICD-10-CM

## 2017-06-18 NOTE — Therapy (Signed)
[facility] [address] [place], [address] Phone: [phone]   Fax:  [phone]  Physical Therapy Treatment  Patient Details  Name: Joel Burgess MRN: [id number] Date of Birth: 09-04-1976 Referring Provider: [name]  Encounter Date: 06/18/2017      PT End of Session - 06/18/17 0846    Visit Number 14   Number of Visits 25   Date for PT Re-Evaluation 07/09/17   Authorization Type no g codes   PT Start Time 0830   PT Stop Time 0905   PT Time Calculation (min) 35 min   Activity Tolerance Patient tolerated treatment well   Behavior During Therapy [facility] for tasks assessed/performed      Past Medical History:  Diagnosis Date  . ADHD (attention deficit hyperactivity disorder)   . Anxiety     Past Surgical History:  Procedure Laterality Date  . APPENDECTOMY    . KNEE SURGERY Right     There were no vitals filed for this visit.      Subjective Assessment - 06/18/17 0843    Subjective Patient reports he been having some knee pain when walking. Patient reports he's been attempting the performing of heel raises but reports increased difficulty.    Pertinent History [name] is a 41 y.o. male with h/o asthma, anxiety, and ADHD who underwent ORIF with IMN of a right closed tibia shaft fracture and pinning of the distal fibula fracture 08/05/2016 by Dr. Marlou Starks. His date of injury is 08/04/2016 when he was wrestling to control his 75 yo son, who has cognitive impairment. His son subsequently fell on him resulting in the injury. Following surgery, he went for physical therapy at [facility] from October through November 2017. He reports that after November he thought he could continue therapy on his own and was discharged. He continued to walk with a limp and have difficulty with his ankle range of motion and strength. He reports that about 3 weeks ago he had insidious onset of pain and  popping in the right knee and ankle. He recently went back for a follow-up with orthopedics at [facility] and they imaged his leg with well healing fractures and no concerns. They stated that he could return to activities without restriction or limitation. They suggested he restart therapy due to continued limitations in RLE strength and mobility. Script provided for physical therapy to work on ankle/knee motion, leg strengthening, walking, and balance.   Limitations Walking   Patient Stated Goals Walk without a limp, return to running, improve his ability to swim at the [facility]   Currently in Pain? No/denies      TREATMENT:  Therapeutic Exercise Total Gym single leg squats-- x9, x12,x16  level 15 Hip machine hip abduction -- 3 x 15 B 40# Leg Press at [facility] 65# -- 3 x 20  Total gym plantar flexion -- x15, x 13, x13  level 15 Squats with UE support and focus on improving glute activation --x20, x20 with 20# kettlebell Single leg stance with raised leg pushing ball against the wall -- 2 x 1 min         PT Education - 06/18/17 0845    Education provided Yes   Education Details form/technique with exercise   Person(s) Educated Patient   Methods Explanation;Demonstration   Comprehension Verbalized understanding;Returned demonstration             PT Long Term Goals - 06/11/17 1746  PT LONG TERM GOAL #1   Title Pt will be independent with HEP in order to improve strength and balance in order to decrease fall risk and improve function at home and work.    Time 8   Period Weeks   Status On-going     PT LONG TERM GOAL #2   Title Pt will improve R hip abduction and extension strength to at least 4+/5 in order to improve gait mechanics and return to patient's goal of running and swimming   Baseline 03/12/17: 4-/5 for abduction and extension; 05/12/17: 03/12/17: 4/5 for abduction and extension; 4+/5 for both movements   Time 8   Period Weeks   Status Achieved     PT LONG TERM GOAL #3    Title Pt will improve R ankle plantarflexion strength so that he can perform at least 10 heel raises in standing in order to improve push off so that he can run   Baseline 03/12/17: unable to clear heel from ground; 05/12/17: able to perform >10rep   Time 8   Period Weeks   Status Achieved     PT LONG TERM GOAL #4   Title Pt will improve R ankle DF AAROM in standing to at least 20 degrees in order to improve gait and running mechanics   Baseline 03/12/17: 14 degrees 05/12/17: 20deg   Time 8   Period Weeks   Status Achieved     PT LONG TERM GOAL #5   Title Pt will increase LEFS by at least 9 points in order to demonstrate significant improvement in lower extremity function.     Baseline 03/20/17: 51/80; 05/12/17: 59/80 06/11/2017 60/80   Time 8   Period Weeks   Status Achieved     Additional Long Term Goals   Additional Long Term Goals Yes     PT LONG TERM GOAL #6   Title Patient will be able to run 100 ft without pain to increase effiency at work and return to recreational activties.   Baseline unable to run without pain; Able to run but increased pain with performance    Time 4   Period Weeks   Status New     PT LONG TERM GOAL #7   Title Patient will be able to perform single leg heel raise on the R to improve running ability   Baseline unable to perform   Time 4   Period Weeks   Status New               Plan - 06/18/17 0623    Clinical Impression Statement Focused on improving plantarflexion and hip strengthening to improve walking ability and ability to walk with decreased difficulty. Patient demonstrates decreased plantarflexion strength with inability to perform single leg heel raise and patient will benefit from further skilled therapy to return to prior level of function.    Rehab Potential Good   Clinical Impairments Affecting Rehab Potential Positive: motivation, age; Negative: persistent weakness/ROM deficits   PT Frequency 2x / week   PT Duration 8 weeks   PT  Treatment/Interventions ADLs/Self Care Home Management;Aquatic Therapy;Biofeedback;Cryotherapy;Electrical Stimulation;Iontophoresis 4mg /ml Dexamethasone;Moist Heat;DME Instruction;Gait training;Stair training;Functional mobility training;Therapeutic activities;Therapeutic exercise;Neuromuscular re-education;Balance training;Patient/family education;Manual techniques;Passive range of motion   PT Next Visit Plan  Measure R ankle AAROM DF in standing, Progress RLE strengthening, assess balance (SLS on RLE), mobilizations for R ankle DF, progress HEP as appropriate (consider progression from bridges to forward or reverse lunges.   PT Home Exercise Plan Heel raises with  increased weighting to R side, standing gastroc step stretch, standing soleus stretch, hooklying bridges, lateral lunges   Consulted and Agree with Plan of Care Patient      Patient will benefit from skilled therapeutic intervention in order to improve the following deficits and impairments:  Abnormal gait, Decreased strength, Difficulty walking, Hypomobility, Impaired flexibility, Pain  Visit Diagnosis: Muscle weakness (generalized)  Stiffness of right ankle, not elsewhere classified  Pain in right ankle and joints of right foot     Problem List Patient Active Problem List   Diagnosis Date Noted  . Anxiety, generalized 03/31/2015  . ADD (attention deficit disorder) 03/31/2015    Blythe Stanford, PT DPT 06/18/2017, 8:56 AM  [facility] [address], [place], [address] Phone: [phone]   Fax:  [phone]  Name: Joel Burgess MRN: [id number] Date of Birth: [birth year]

## 2017-06-23 ENCOUNTER — Ambulatory Visit: Payer: 59 | Attending: Physician Assistant

## 2017-06-23 DIAGNOSIS — M25671 Stiffness of right ankle, not elsewhere classified: Secondary | ICD-10-CM | POA: Insufficient documentation

## 2017-06-23 DIAGNOSIS — M6281 Muscle weakness (generalized): Secondary | ICD-10-CM | POA: Insufficient documentation

## 2017-06-23 DIAGNOSIS — M25571 Pain in right ankle and joints of right foot: Secondary | ICD-10-CM | POA: Insufficient documentation

## 2017-06-27 ENCOUNTER — Ambulatory Visit: Payer: 59

## 2017-06-30 ENCOUNTER — Ambulatory Visit: Payer: 59

## 2017-07-02 ENCOUNTER — Ambulatory Visit: Payer: 59

## 2017-07-02 DIAGNOSIS — M25571 Pain in right ankle and joints of right foot: Secondary | ICD-10-CM | POA: Diagnosis present

## 2017-07-02 DIAGNOSIS — M6281 Muscle weakness (generalized): Secondary | ICD-10-CM

## 2017-07-02 DIAGNOSIS — M25671 Stiffness of right ankle, not elsewhere classified: Secondary | ICD-10-CM

## 2017-07-02 NOTE — Therapy (Signed)
[facility] [address], [place], [address] Phone: [phone]   Fax:  [phone]  Physical Therapy Treatment  Patient Details  Name: Joel Burgess MRN: [id number] Date of Birth: 1976-05-15 Referring Provider: [name]  Encounter Date: 07/02/2017      PT End of Session - 07/02/17 0905    Visit Number 15   Number of Visits 25   Date for PT Re-Evaluation 07/09/17   Authorization Type no g codes   PT Start Time 0830   PT Stop Time 0908   PT Time Calculation (min) 38 min   Activity Tolerance Patient tolerated treatment well   Behavior During Therapy [facility] for tasks assessed/performed      Past Medical History:  Diagnosis Date  . ADHD (attention deficit hyperactivity disorder)   . Anxiety     Past Surgical History:  Procedure Laterality Date  . APPENDECTOMY    . KNEE SURGERY Right     There were no vitals filed for this visit.      Subjective Assessment - 07/02/17 0854    Subjective Patient reports increased soreness after his previous session. Patient reports he's hasn't been "limping" as much.    Pertinent History Joel Burgess is a 41 y.o. male with h/o asthma, anxiety, and ADHD who underwent ORIF with IMN of a right closed tibia shaft fracture and pinning of the distal fibula fracture 08/05/2016 by Dr. Marlou Starks. His date of injury is 08/04/2016 when he was wrestling to control his 57 yo son, who has cognitive impairment. His son subsequently fell on him resulting in the injury. Following surgery, he went for physical therapy at [place] from October through November 2017. He reports that after November he thought he could continue therapy on his own and was discharged. He continued to walk with a limp and have difficulty with his ankle range of motion and strength. He reports that about 3 weeks ago he had insidious onset of pain and popping in the right knee and ankle. He  recently went back for a follow-up with orthopedics at [facility] and they imaged his leg with well healing fractures and no concerns. They stated that he could return to activities without restriction or limitation. They suggested he restart therapy due to continued limitations in RLE strength and mobility. Script provided for physical therapy to work on ankle/knee motion, leg strengthening, walking, and balance.   Limitations Walking   Patient Stated Goals Walk without a limp, return to running, improve his ability to swim at the [facility]   Currently in Pain? No/denies         TREATMENT:   Therapeutic Exercise Total Gym single leg squats -- 3 x 20 level 15 B  Total gym plantar flexion - x25 level 15; x25, x15 level 17; Hip machine hip abduction -- 3 x 20 B 40# Prostretch in standing - 5 sec holds x 10 Plantarflexion stretch - 1 min holds x 3 B  Patient demonstrates no aggravation of symptoms throughout session.         PT Education - 07/02/17 0905    Education provided Yes   Education Details form/technique with exercise   Person(s) Educated Patient   Methods Demonstration;Explanation   Comprehension Verbalized understanding;Returned demonstration             PT Long Term Goals - 06/11/17 1746      PT LONG TERM GOAL #1   Title Pt will be independent  with HEP in order to improve strength and balance in order to decrease fall risk and improve function at home and work.    Time 8   Period Weeks   Status On-going     PT LONG TERM GOAL #2   Title Pt will improve R hip abduction and extension strength to at least 4+/5 in order to improve gait mechanics and return to patient's goal of running and swimming   Baseline 03/12/17: 4-/5 for abduction and extension; 05/12/17: 03/12/17: 4/5 for abduction and extension; 4+/5 for both movements   Time 8   Period Weeks   Status Achieved     PT LONG TERM GOAL #3   Title Pt will improve R ankle plantarflexion strength so that he can perform  at least 10 heel raises in standing in order to improve push off so that he can run   Baseline 03/12/17: unable to clear heel from ground; 05/12/17: able to perform >10rep   Time 8   Period Weeks   Status Achieved     PT LONG TERM GOAL #4   Title Pt will improve R ankle DF AAROM in standing to at least 20 degrees in order to improve gait and running mechanics   Baseline 03/12/17: 14 degrees 05/12/17: 20deg   Time 8   Period Weeks   Status Achieved     PT LONG TERM GOAL #5   Title Pt will increase LEFS by at least 9 points in order to demonstrate significant improvement in lower extremity function.     Baseline 03/20/17: 51/80; 05/12/17: 59/80 06/11/2017 60/80   Time 8   Period Weeks   Status Achieved     Additional Long Term Goals   Additional Long Term Goals Yes     PT LONG TERM GOAL #6   Title Patient will be able to run 100 ft without pain to increase effiency at work and return to recreational activties.   Baseline unable to run without pain; Able to run but increased pain with performance    Time 4   Period Weeks   Status New     PT LONG TERM GOAL #7   Title Patient will be able to perform single leg heel raise on the R to improve running ability   Baseline unable to perform   Time 4   Period Weeks   Status New               Plan - 07/02/17 0906    Clinical Impression Statement Continued to focus on strengthening patient's plantarflexion, quadricep, and hip abduction musculature. Patient demonstrates improvement with exercises being able to perform greater amount of repetitions before onset of fatigue. Patient demonstrates increased fatigue at end of session and will beenfit from further skilled therapy to return to prior level of function.    Rehab Potential Good   Clinical Impairments Affecting Rehab Potential Positive: motivation, age; Negative: persistent weakness/ROM deficits   PT Frequency 2x / week   PT Duration 8 weeks   PT Treatment/Interventions ADLs/Self  Care Home Management;Aquatic Therapy;Biofeedback;Cryotherapy;Electrical Stimulation;Iontophoresis 4mg /ml Dexamethasone;Moist Heat;DME Instruction;Gait training;Stair training;Functional mobility training;Therapeutic activities;Therapeutic exercise;Neuromuscular re-education;Balance training;Patient/family education;Manual techniques;Passive range of motion   PT Next Visit Plan  Measure R ankle AAROM DF in standing, Progress RLE strengthening, assess balance (SLS on RLE), mobilizations for R ankle DF, progress HEP as appropriate (consider progression from bridges to forward or reverse lunges.   PT Home Exercise Plan Heel raises with increased weighting to R side, standing gastroc  step stretch, standing soleus stretch, hooklying bridges, lateral lunges   Consulted and Agree with Plan of Care Patient      Patient will benefit from skilled therapeutic intervention in order to improve the following deficits and impairments:  Abnormal gait, Decreased strength, Difficulty walking, Hypomobility, Impaired flexibility, Pain  Visit Diagnosis: Muscle weakness (generalized)  Stiffness of right ankle, not elsewhere classified  Pain in right ankle and joints of right foot     Problem List Patient Active Problem List   Diagnosis Date Noted  . Anxiety, generalized 03/31/2015  . ADD (attention deficit disorder) 03/31/2015    Blythe Stanford, PT DPT 07/02/2017, 9:27 AM  [facility] [address], [place], [address] Phone: [phone]   Fax:  [phone]  Name: Joel Burgess MRN: [id number] Date of Birth: 1976-05-30

## 2017-07-07 ENCOUNTER — Ambulatory Visit: Payer: 59

## 2017-07-09 ENCOUNTER — Ambulatory Visit: Payer: 59

## 2017-07-09 DIAGNOSIS — M6281 Muscle weakness (generalized): Secondary | ICD-10-CM | POA: Diagnosis not present

## 2017-07-09 DIAGNOSIS — M25571 Pain in right ankle and joints of right foot: Secondary | ICD-10-CM

## 2017-07-09 DIAGNOSIS — M25671 Stiffness of right ankle, not elsewhere classified: Secondary | ICD-10-CM

## 2017-07-09 NOTE — Therapy (Signed)
[place] [address], [place], [address] Phone: [phone]   Fax:  [phone]  Physical Therapy Treatment  Patient Details  Name: Joel Burgess MRN: [id number] Date of Birth: 06-01-1976 Referring Provider: [name]  Encounter Date: 07/09/2017      PT End of Session - 07/09/17 0849    Visit Number 16   Number of Visits 33   Date for PT Re-Evaluation 08/07/17   Authorization Type no g codes   PT Start Time 0849   PT Stop Time 0936   PT Time Calculation (min) 47 min   Activity Tolerance Patient tolerated treatment well   Behavior During Therapy [facility] for tasks assessed/performed      Past Medical History:  Diagnosis Date  . ADHD (attention deficit hyperactivity disorder)   . Anxiety     Past Surgical History:  Procedure Laterality Date  . APPENDECTOMY    . KNEE SURGERY Right     There were no vitals filed for this visit.      Subjective Assessment - 07/09/17 0851    Subjective  R ankle is sore a little bit because he has to put weight on it a lot at work.   Feels pain in his R anterior foot tendon. Can tolerate the pain.  Pt states that he is better able to run since starting PT at this clinic.    Pertinent History [name] is a 41 y.o. male with h/o asthma, anxiety, and ADHD who underwent ORIF with IMN of a right closed tibia shaft fracture and pinning of the distal fibula fracture 08/05/2016 by Dr. Marlou Starks. His date of injury is 08/04/2016 when he was wrestling to control his 87 yo son, who has cognitive impairment. His son subsequently fell on him resulting in the injury. Following surgery, he went for physical therapy at [facility] from October through November 2017. He reports that after November he thought he could continue therapy on his own and was discharged. He continued to walk with a limp and have difficulty with his ankle range of motion and strength. He  reports that about 3 weeks ago he had insidious onset of pain and popping in the right knee and ankle. He recently went back for a follow-up with orthopedics at [facility] and they imaged his leg with well healing fractures and no concerns. They stated that he could return to activities without restriction or limitation. They suggested he restart therapy due to continued limitations in RLE strength and mobility. Script provided for physical therapy to work on ankle/knee motion, leg strengthening, walking, and balance.   Limitations Walking   Patient Stated Goals Walk without a limp, return to running, improve his ability to swim at the [facility]   Currently in Pain? Yes   Pain Score --  R anterior foot tendon, no pain level provided.    Pain Orientation Right                                 PT Education - 07/09/17 0854    Education provided Yes   Education Details ther-ex   Person(s) Educated Patient   Methods Explanation;Demonstration;Tactile cues;Verbal cues   Comprehension Verbalized understanding;Returned demonstration     Objectives  Pt states that he does not want to run today.     There-ex   Prostretch in standing - 5 sec holds x 10  Total gym plantar flexion - x25level 15; x25, x15 level 17;  Standing bilateral gastroc stretch at incline board 30 seconds x 3  Hip machine hip abduction -- 3 x 20 B 40#  Standing R tip toe with bilateral UE to no UE assist. 1x. Pt able to lift R heel off the floor about 2 inches with anterior foot pain which eases with rest and movement.   Total Gym single leg squats -- 3 x 20level 15. Cues for femoral control    Improved exercise technique, movement at target joints, use of target muscles after min to mod verbal, visual, tactile cues.    Manual therapy   STM R anterior tibial muscles. Improved R leg and foot comfort per pt.     Pt able to lift R heel off the floor (single leg heel raise) with bilateral UE to no  UE assist about 2 inches but with anterior foot pain around the toe extensor tendon area. Improving R gastronemius muscle strength overall.  Unable to assess running today secondary to pt request but per subjective reports, running has improved at work since starting PT at this clinic. Pt still demonstrates R foot pain, R LE weakness, and difficulty running and will benefit from continued skilled PT services to address the aforementioned deficits.          PT Long Term Goals - 07/09/17 1326      PT LONG TERM GOAL #1   Title Pt will be independent with HEP in order to improve strength and balance in order to decrease fall risk and improve function at home and work.    Time 4   Period Weeks   Status On-going     PT LONG TERM GOAL #2   Title Pt will improve R hip abduction and extension strength to at least 4+/5 in order to improve gait mechanics and return to patient's goal of running and swimming   Baseline 03/12/17: 4-/5 for abduction and extension; 05/12/17: 03/12/17: 4/5 for abduction and extension; 4+/5 for both movements   Time 8   Period Weeks   Status Achieved     PT LONG TERM GOAL #3   Title Pt will improve R ankle plantarflexion strength so that he can perform at least 10 heel raises in standing in order to improve push off so that he can run   Baseline 03/12/17: unable to clear heel from ground; 05/12/17: able to perform >10rep   Time 8   Period Weeks   Status Achieved     PT LONG TERM GOAL #4   Title Pt will improve R ankle DF AAROM in standing to at least 20 degrees in order to improve gait and running mechanics   Baseline 03/12/17: 14 degrees 05/12/17: 20deg   Time 8   Period Weeks   Status Achieved     PT LONG TERM GOAL #5   Title Pt will increase LEFS by at least 9 points in order to demonstrate significant improvement in lower extremity function.     Baseline 03/20/17: 51/80; 05/12/17: 59/80 06/11/2017 60/80   Time 8   Period Weeks   Status Achieved     PT LONG TERM  GOAL #6   Title Patient will be able to run 100 ft without pain to increase effiency at work and return to recreational activties.   Baseline unable to run without pain; Able to run but increased pain with performance; unable to assess today per pt request (07/09/2017)   Time 4  Period Weeks   Status Unable to assess     PT LONG TERM GOAL #7   Title Patient will be able to perform single leg heel raise on the R to improve running ability   Baseline unable to perform; able to raise R heel off the floor about 2 inches but with anterior foot pain (07/09/2017)   Time 4   Period Weeks   Status On-going               Plan - 07/09/17 0857    Clinical Impression Statement Pt able to lift R heel off the floor (single leg heel raise) with bilateral UE to no UE assist about 2 inches but with anterior foot pain around the toe extensor tendon area. Improving R gastronemius muscle strength overall.  Unable to assess running today secondary to pt request but per subjective reports, running has improved at work since starting PT at this clinic. Pt still demonstrates R foot pain, R LE weakness, and difficulty running and will benefit from continued skilled PT services to address the aforementioned deficits.    Clinical Presentation Stable   Clinical Decision Making Low   Rehab Potential Good   Clinical Impairments Affecting Rehab Potential Positive: motivation, age; Negative: persistent weakness/ROM deficits   PT Frequency 2x / week   PT Duration 4 weeks   PT Treatment/Interventions ADLs/Self Care Home Management;Aquatic Therapy;Biofeedback;Cryotherapy;Electrical Stimulation;Iontophoresis 4mg /ml Dexamethasone;Moist Heat;DME Instruction;Gait training;Stair training;Functional mobility training;Therapeutic activities;Therapeutic exercise;Neuromuscular re-education;Balance training;Patient/family education;Manual techniques;Passive range of motion   PT Next Visit Plan  Measure R ankle AAROM DF in  standing, Progress RLE strengthening, assess balance (SLS on RLE), mobilizations for R ankle DF, progress HEP as appropriate (consider progression from bridges to forward or reverse lunges.   PT Home Exercise Plan Heel raises with increased weighting to R side, standing gastroc step stretch, standing soleus stretch, hooklying bridges, lateral lunges   Consulted and Agree with Plan of Care Patient      Patient will benefit from skilled therapeutic intervention in order to improve the following deficits and impairments:  Abnormal gait, Decreased strength, Difficulty walking, Hypomobility, Impaired flexibility, Pain  Visit Diagnosis: Muscle weakness (generalized) - Plan: PT plan of care cert/re-cert  Stiffness of right ankle, not elsewhere classified - Plan: PT plan of care cert/re-cert  Pain in right ankle and joints of right foot - Plan: PT plan of care cert/re-cert     Problem List Patient Active Problem List   Diagnosis Date Noted  . Anxiety, generalized 03/31/2015  . ADD (attention deficit disorder) 03/31/2015   Joneen Boers PT, DPT   07/09/2017, 1:38 PM   [facility] [address], [place], [address] Phone: [phone]   Fax:  [phone]  Name: Joel Burgess MRN: [id number]  Date of Birth: 1976/09/20

## 2017-07-14 ENCOUNTER — Ambulatory Visit: Payer: 59

## 2017-07-16 ENCOUNTER — Ambulatory Visit: Payer: 59

## 2017-07-21 ENCOUNTER — Ambulatory Visit: Payer: 59

## 2017-07-23 ENCOUNTER — Other Ambulatory Visit: Payer: Self-pay | Admitting: Family Medicine

## 2017-07-23 ENCOUNTER — Ambulatory Visit: Payer: Managed Care, Other (non HMO) | Attending: Physician Assistant

## 2017-07-23 DIAGNOSIS — M6281 Muscle weakness (generalized): Secondary | ICD-10-CM | POA: Diagnosis present

## 2017-07-23 DIAGNOSIS — M25571 Pain in right ankle and joints of right foot: Secondary | ICD-10-CM | POA: Insufficient documentation

## 2017-07-23 DIAGNOSIS — M25671 Stiffness of right ankle, not elsewhere classified: Secondary | ICD-10-CM | POA: Diagnosis present

## 2017-07-23 MED ORDER — ALBUTEROL SULFATE HFA 108 (90 BASE) MCG/ACT IN AERS: INHALATION_SPRAY | RESPIRATORY_TRACT | 5 refills | Status: DC

## 2017-07-23 NOTE — Telephone Encounter (Signed)
Please review. Thanks!  

## 2017-07-23 NOTE — Telephone Encounter (Signed)
[facility] faxed refill request for the following medications: PROAIR HFA 108 (90 Base) MCG/ACT inhaler    Last Rx: 06/14/16 with 5 refills LOV: 03/19/17 Please advise. Thanks TNP

## 2017-07-23 NOTE — Therapy (Signed)
[facility] [address] [place], [address] Phone: [phone]   Fax:  [phone]  Physical Therapy Treatment  Patient Details  Name: Joel Burgess MRN: [id number] Date of Birth: 1976-07-08 Referring Provider: [name]  Encounter Date: 07/23/2017      PT End of Session - 07/23/17 0901    Visit Number 17   Number of Visits 33   Date for PT Re-Evaluation 08/07/17   Authorization Type no g codes   PT Start Time 0845   PT Stop Time 0930   PT Time Calculation (min) 45 min   Activity Tolerance Patient tolerated treatment well   Behavior During Therapy [facility] for tasks assessed/performed      Past Medical History:  Diagnosis Date  . ADHD (attention deficit hyperactivity disorder)   . Anxiety     Past Surgical History:  Procedure Laterality Date  . APPENDECTOMY    . KNEE SURGERY Right     There were no vitals filed for this visit.      Subjective Assessment - 07/23/17 0852    Subjective Patient states decreased ankle pain at work but demonstrates decreased strength/ increased pain with performing speed walking and running.   Pertinent History Joel Burgess is a 41 y.o. male with h/o asthma, anxiety, and ADHD who underwent ORIF with IMN of a right closed tibia shaft fracture and pinning of the distal fibula fracture 08/05/2016 by Dr. Marlou Starks. His date of injury is 08/04/2016 when he was wrestling to control his 86 yo son, who has cognitive impairment. His son subsequently fell on him resulting in the injury. Following surgery, he went for physical therapy at [facility] from October through November 2017. He reports that after November he thought he could continue therapy on his own and was discharged. He continued to walk with a limp and have difficulty with his ankle range of motion and strength. He reports that about 3 weeks ago he had insidious onset of pain and popping in the right knee  and ankle. He recently went back for a follow-up with orthopedics at [facility] and they imaged his leg with well healing fractures and no concerns. They stated that he could return to activities without restriction or limitation. They suggested he restart therapy due to continued limitations in RLE strength and mobility. Script provided for physical therapy to work on ankle/knee motion, leg strengthening, walking, and balance.   Limitations Walking   Patient Stated Goals Walk without a limp, return to running, improve his ability to swim at the [facility]   Currently in Pain? No/denies        TREATMENT:  Manual Therapy: STM to patient extensor digitorum brevis on the affected side to decrease increased pain and spasms.   Therapeutic Exercise [employer] single leg squats -- 3 x 15 level 26 B; B LE level 26 x15 Toe Extension against GTB in long sitting - x 25 with 2 sec holds [employer] plantar flexion - 3 x 15 level 26 B Standing plantarflexion stretch in stand on ramp - 3 x 30sec  Single leg stance reach out while standing on airex pad - x20  Standing knee flexion on the right side - x 20    Patient demonstrates no aggravation of symptoms throughout session        PT Education - 07/23/17 0859    Education provided Yes   Education Details form/technique with exercise   Person(s) Educated Patient  Methods Explanation;Demonstration   Comprehension Verbalized understanding;Returned demonstration             PT Long Term Goals - 07/09/17 1326      PT LONG TERM GOAL #1   Title Pt will be independent with HEP in order to improve strength and balance in order to decrease fall risk and improve function at home and work.    Time 4   Period Weeks   Status On-going     PT LONG TERM GOAL #2   Title Pt will improve R hip abduction and extension strength to at least 4+/5 in order to improve gait mechanics and return to patient's goal of running and swimming   Baseline 03/12/17: 4-/5 for  abduction and extension; 05/12/17: 03/12/17: 4/5 for abduction and extension; 4+/5 for both movements   Time 8   Period Weeks   Status Achieved     PT LONG TERM GOAL #3   Title Pt will improve R ankle plantarflexion strength so that he can perform at least 10 heel raises in standing in order to improve push off so that he can run   Baseline 03/12/17: unable to clear heel from ground; 05/12/17: able to perform >10rep   Time 8   Period Weeks   Status Achieved     PT LONG TERM GOAL #4   Title Pt will improve R ankle DF AAROM in standing to at least 20 degrees in order to improve gait and running mechanics   Baseline 03/12/17: 14 degrees 05/12/17: 20deg   Time 8   Period Weeks   Status Achieved     PT LONG TERM GOAL #5   Title Pt will increase LEFS by at least 9 points in order to demonstrate significant improvement in lower extremity function.     Baseline 03/20/17: 51/80; 05/12/17: 59/80 06/11/2017 60/80   Time 8   Period Weeks   Status Achieved     PT LONG TERM GOAL #6   Title Patient will be able to run 100 ft without pain to increase effiency at work and return to recreational activties.   Baseline unable to run without pain; Able to run but increased pain with performance; unable to assess today per pt request (07/09/2017)   Time 4   Period Weeks   Status Unable to assess     PT LONG TERM GOAL #7   Title Patient will be able to perform single leg heel raise on the R to improve running ability   Baseline unable to perform; able to raise R heel off the floor about 2 inches but with anterior foot pain (07/09/2017)   Time 4   Period Weeks   Status On-going               Plan - 07/23/17 0904    Clinical Impression Statement Patient demonstrates improvement with performance of heel raises with ability to perform greater amount of repetitions before onset of fatigue. Patient demonstrates incrased pain at end of therapy session along Medial upper aspect of the lower leg with bending  the knee in standing indicating increased LE weakness and poor motor control likely along the gastrocnemius and soleus. Patient will benefit from further skilled therapy to return to prior level of function.    Rehab Potential Good   Clinical Impairments Affecting Rehab Potential Positive: motivation, age; Negative: persistent weakness/ROM deficits   PT Frequency 2x / week   PT Duration 4 weeks   PT Treatment/Interventions ADLs/Self Care Home Management;Aquatic  Therapy;Biofeedback;Cryotherapy;Electrical Stimulation;Iontophoresis 4mg /ml Dexamethasone;Moist Heat;DME Instruction;Gait training;Stair training;Functional mobility training;Therapeutic activities;Therapeutic exercise;Neuromuscular re-education;Balance training;Patient/family education;Manual techniques;Passive range of motion   PT Next Visit Plan  Measure R ankle AAROM DF in standing, Progress RLE strengthening, assess balance (SLS on RLE), mobilizations for R ankle DF, progress HEP as appropriate (consider progression from bridges to forward or reverse lunges.   PT Home Exercise Plan Heel raises with increased weighting to R side, standing gastroc step stretch, standing soleus stretch, hooklying bridges, lateral lunges   Consulted and Agree with Plan of Care Patient      Patient will benefit from skilled therapeutic intervention in order to improve the following deficits and impairments:  Abnormal gait, Decreased strength, Difficulty walking, Hypomobility, Impaired flexibility, Pain  Visit Diagnosis: Muscle weakness (generalized)  Stiffness of right ankle, not elsewhere classified  Pain in right ankle and joints of right foot     Problem List Patient Active Problem List   Diagnosis Date Noted  . Anxiety, generalized 03/31/2015  . ADD (attention deficit disorder) 03/31/2015    Blythe Stanford, PT DPT 07/23/2017, 9:24 AM  [facility] [address],  [place], [address] Phone: [phone]   Fax:  [phone]  Name: Kendall Justo MRN: [id number] Date of Birth: 1976-03-27

## 2017-07-29 ENCOUNTER — Ambulatory Visit: Payer: Managed Care, Other (non HMO)

## 2017-07-30 ENCOUNTER — Encounter: Payer: Self-pay | Admitting: Family Medicine

## 2017-07-30 ENCOUNTER — Ambulatory Visit (INDEPENDENT_AMBULATORY_CARE_PROVIDER_SITE_OTHER): Payer: 59 | Admitting: Family Medicine

## 2017-07-30 ENCOUNTER — Ambulatory Visit
Admission: RE | Admit: 2017-07-30 | Discharge: 2017-07-30 | Disposition: A | Discharge status: Home / Self Care | Payer: 59 | Source: Ambulatory Visit | Attending: Family Medicine | Admitting: Family Medicine

## 2017-07-30 VITALS — BP 104/60 | HR 61 | Temp 98.3°F | Resp 16 | Wt 171.0 lb

## 2017-07-30 DIAGNOSIS — J329 Chronic sinusitis, unspecified: Secondary | ICD-10-CM | POA: Diagnosis not present

## 2017-07-30 DIAGNOSIS — R05 Cough: Secondary | ICD-10-CM

## 2017-07-30 DIAGNOSIS — R059 Cough, unspecified: Secondary | ICD-10-CM

## 2017-07-30 MED ORDER — AZITHROMYCIN 250 MG PO TABS: ORAL_TABLET | ORAL | 0 refills | Status: AC

## 2017-07-30 NOTE — Patient Instructions (Signed)
Go to the [facility] on [address] for chest Xray  

## 2017-07-30 NOTE — Progress Notes (Signed)
Patient: Joel Burgess Male    DOB: [birth year]   41 y.o.   MRN: [id number] Visit Date: 07/30/2017  Today's Provider: Lelon Huh, MD   Chief Complaint  Patient presents with  . Sore Throat   Subjective:    Patient has had sore throat and sinus pressure for 4 days ago. Other symptoms include sinus congestion, fever and body pain. Patient stated that he had osetamivir 75 mg.    Sore Throat   This is a new problem. The current episode started in the past 7 days (4 days ago). The problem has been unchanged. Associated symptoms include congestion and shortness of breath. Pertinent negatives include no abdominal pain, coughing, diarrhea, drooling, ear discharge, ear pain, headaches, hoarse voice, plugged ear sensation, neck pain, stridor, swollen glands, trouble swallowing or vomiting. He has had no exposure to strep or mono. Treatments tried: osetamivir 75 mg 1 tablet. The treatment provided no relief.   Had chills and sweats last night. Not taken any OTC medications. cough     Allergies  Allergen Reactions  . Penicillins Anaphylaxis    Has patient had a PCN reaction causing immediate rash, facial/tongue/throat swelling, SOB or lightheadedness with hypotension: Yes Has patient had a PCN reaction causing severe rash involving mucus membranes or skin necrosis: No Has patient had a PCN reaction that required hospitalization pt not sure Has patient had a PCN reaction occurring within the last 10 years: No If all of the above answers are "NO", then may proceed with Cephalosporin use.     Current Outpatient Prescriptions:  .  albuterol (PROAIR HFA) 108 (90 Base) MCG/ACT inhaler, INHALE TWO PUFFS INTO THE LUNGS EVERY 6 HOURS AS NEEDED FOR WHEEZING, Disp: 8.5 g, Rfl: 5 .  ALPRAZolam (XANAX) 1 MG tablet, TAKE ONE TABLET TWICE DAILY AS NEEDED FOR ANXIETY, Disp: 60 tablet, Rfl: 5 .  amphetamine-dextroamphetamine (ADDERALL) 30 MG tablet, Take 1 tablet by mouth 2 (two) times daily., Disp:  60 tablet, Rfl: 0 .  diazepam (VALIUM) 10 MG tablet, TAKE ONE TABLET BY MOUTH EVERY DAY AS NEEDED FOR MUSCLE SPASMS, Disp: 30 tablet, Rfl: 5  Review of Systems  Constitutional: Negative for appetite change, chills and fever.  HENT: Positive for congestion and sore throat. Negative for drooling, ear discharge, ear pain, hoarse voice and trouble swallowing.   Respiratory: Positive for shortness of breath. Negative for cough, chest tightness, wheezing and stridor.   Cardiovascular: Negative for chest pain and palpitations.  Gastrointestinal: Negative for abdominal pain, diarrhea, nausea and vomiting.  Musculoskeletal: Negative for neck pain.  Neurological: Negative for headaches.    Social History  Substance Use Topics  . Smoking status: Never Smoker  . Smokeless tobacco: Never Used  . Alcohol use No   Objective:   BP 104/60 (BP Location: Right Arm, Patient Position: Sitting, Cuff Size: Large)   Pulse 61   Temp 98.3 F (36.8 C) (Oral)   Resp 16   Wt 171 lb (77.6 kg)   SpO2 97%   BMI 26.00 kg/m  Vitals:   07/30/17 1119  BP: 104/60  Pulse: 61  Resp: 16  Temp: 98.3 F (36.8 C)  TempSrc: Oral  SpO2: 97%  Weight: 171 lb (77.6 kg)     Physical Exam  General Appearance:    Alert, cooperative, no distress  HENT:   right TM fluid noted, neck without nodes, right frontal sinus tender and nasal mucosa congested  Eyes:    PERRL, conjunctiva/corneas clear, EOM's intact  Lungs:     Clear to auscultation bilaterally, respirations unlabored  Heart:    Regular rate and rhythm  Neurologic:   Awake, alert, oriented x 3. No apparent focal neurological           defect.           Assessment & Plan:      1. Sinusitis, unspecified chronicity, unspecified location  - azithromycin (ZITHROMAX) 250 MG tablet; 2 by mouth today, then 1 daily for 4 days  Dispense: 6 tablet; Refill: 0  2. Cough  - DG Chest 2 View; Future  Call if symptoms change or if not rapidly improving.     The entirety of the information documented in the History of Present Illness, Review of Systems and Physical Exam were personally obtained by me. Portions of this information were initially documented by April M. Sabra Heck, CMA and reviewed by me for thoroughness and accuracy.        Lelon Huh, MD  [facility]

## 2017-07-31 ENCOUNTER — Ambulatory Visit: Payer: Managed Care, Other (non HMO)

## 2017-07-31 DIAGNOSIS — M25671 Stiffness of right ankle, not elsewhere classified: Secondary | ICD-10-CM

## 2017-07-31 DIAGNOSIS — M6281 Muscle weakness (generalized): Secondary | ICD-10-CM | POA: Diagnosis not present

## 2017-07-31 DIAGNOSIS — M25571 Pain in right ankle and joints of right foot: Secondary | ICD-10-CM

## 2017-07-31 NOTE — Therapy (Signed)
[facility] [address] [place], [address] Phone: [phone]   Fax:  [phone]  Physical Therapy Treatment  Patient Details  Name: Joel Burgess MRN: [id number] Date of Birth: [birth year] Referring Provider: [name]  Encounter Date: 07/31/2017      PT End of Session - 07/31/17 1338    Visit Number 18   Number of Visits 33   Date for PT Re-Evaluation 08/07/17   Authorization Type no g codes   PT Start Time 1300   PT Stop Time 1345   PT Time Calculation (min) 45 min   Activity Tolerance Patient tolerated treatment well   Behavior During Therapy [facility] for tasks assessed/performed      Past Medical History:  Diagnosis Date  . ADHD (attention deficit hyperactivity disorder)   . Anxiety     Past Surgical History:  Procedure Laterality Date  . APPENDECTOMY    . KNEE SURGERY Right     There were no vitals filed for this visit.      Subjective Assessment - 07/31/17 1328    Subjective Patient states he's been sick over the past week and reports increased intermittent pain along the lateral aspect of his ankle when walking.    Pertinent History Joel Burgess is a 41 y.o. male with h/o asthma, anxiety, and ADHD who underwent ORIF with IMN of a right closed tibia shaft fracture and pinning of the distal fibula fracture 08/05/2016 by Dr. Marlou Starks. His date of injury is 08/04/2016 when he was wrestling to control his 53 yo son, who has cognitive impairment. His son subsequently fell on him resulting in the injury. Following surgery, he went for physical therapy at [facility] from October through November 2017. He reports that after November he thought he could continue therapy on his own and was discharged. He continued to walk with a limp and have difficulty with his ankle range of motion and strength. He reports that about 3 weeks ago he had insidious onset of pain and popping in the right  knee and ankle. He recently went back for a follow-up with orthopedics at [facility] and they imaged his leg with well healing fractures and no concerns. They stated that he could return to activities without restriction or limitation. They suggested he restart therapy due to continued limitations in RLE strength and mobility. Script provided for physical therapy to work on ankle/knee motion, leg strengthening, walking, and balance.   Limitations Walking   Patient Stated Goals Walk without a limp, return to running, improve his ability to swim at the [facility]   Currently in Pain? No/denies         TREATMENT:   Manual Therapy: STM to patient's peroneal and gastrocnemius musculature on the affected side to decrease increased pain and spasms.  Dry Needling: (4 min) (One) needle placed along the patient's R gastrocnemius to decrease increased pain and spasms in the area with patient positioned in long sitting.    Therapeutic Exercise Isometric ankle eversion - x10 with 5sec holds on R leg only Ankle eversion against RTB - 4 x 20 on the R leg only    Patient demonstrates no aggravation of symptoms throughout session          PT Education - 07/31/17 1337    Education provided Yes   Education Details HEP: ankle eversion against RTB, eversion isometrics   Person(s) Educated Patient   Methods Explanation;Demonstration   Comprehension Verbalized understanding;Returned demonstration  PT Long Term Goals - 07/09/17 1326      PT LONG TERM GOAL #1   Title Pt will be independent with HEP in order to improve strength and balance in order to decrease fall risk and improve function at home and work.    Time 4   Period Weeks   Status On-going     PT LONG TERM GOAL #2   Title Pt will improve R hip abduction and extension strength to at least 4+/5 in order to improve gait mechanics and return to patient's goal of running and swimming   Baseline 03/12/17: 4-/5 for abduction and extension;  05/12/17: 03/12/17: 4/5 for abduction and extension; 4+/5 for both movements   Time 8   Period Weeks   Status Achieved     PT LONG TERM GOAL #3   Title Pt will improve R ankle plantarflexion strength so that he can perform at least 10 heel raises in standing in order to improve push off so that he can run   Baseline 03/12/17: unable to clear heel from ground; 05/12/17: able to perform >10rep   Time 8   Period Weeks   Status Achieved     PT LONG TERM GOAL #4   Title Pt will improve R ankle DF AAROM in standing to at least 20 degrees in order to improve gait and running mechanics   Baseline 03/12/17: 14 degrees 05/12/17: 20deg   Time 8   Period Weeks   Status Achieved     PT LONG TERM GOAL #5   Title Pt will increase LEFS by at least 9 points in order to demonstrate significant improvement in lower extremity function.     Baseline 03/20/17: 51/80; 05/12/17: 59/80 06/11/2017 60/80   Time 8   Period Weeks   Status Achieved     PT LONG TERM GOAL #6   Title Patient will be able to run 100 ft without pain to increase effiency at work and return to recreational activties.   Baseline unable to run without pain; Able to run but increased pain with performance; unable to assess today per pt request (07/09/2017)   Time 4   Period Weeks   Status Unable to assess     PT LONG TERM GOAL #7   Title Patient will be able to perform single leg heel raise on the R to improve running ability   Baseline unable to perform; able to raise R heel off the floor about 2 inches but with anterior foot pain (07/09/2017)   Time 4   Period Weeks   Status On-going               Plan - 07/31/17 1339    Clinical Impression Statement Patient demonstrates increased muscular spasms and pain upon palpation to gastrocnemius and along peroneals. Patient demonstrates decreased pain after performing STM, dry needling indicating decreased trigger points and pain. Patient deomnstrates increased pain along peroneal tendon  indicating possible tendonapathy. Patient will benefit from further skilled therapy to return to prior level of function.    Rehab Potential Good   Clinical Impairments Affecting Rehab Potential Positive: motivation, age; Negative: persistent weakness/ROM deficits   PT Frequency 2x / week   PT Duration 4 weeks   PT Treatment/Interventions ADLs/Self Care Home Management;Aquatic Therapy;Biofeedback;Cryotherapy;Electrical Stimulation;Iontophoresis 4mg /ml Dexamethasone;Moist Heat;DME Instruction;Gait training;Stair training;Functional mobility training;Therapeutic activities;Therapeutic exercise;Neuromuscular re-education;Balance training;Patient/family education;Manual techniques;Passive range of motion   PT Next Visit Plan  Measure R ankle AAROM DF in standing, Progress RLE strengthening, assess balance (  SLS on RLE), mobilizations for R ankle DF, progress HEP as appropriate (consider progression from bridges to forward or reverse lunges.   PT Home Exercise Plan Heel raises with increased weighting to R side, standing gastroc step stretch, standing soleus stretch, hooklying bridges, lateral lunges   Consulted and Agree with Plan of Care Patient      Patient will benefit from skilled therapeutic intervention in order to improve the following deficits and impairments:  Abnormal gait, Decreased strength, Difficulty walking, Hypomobility, Impaired flexibility, Pain  Visit Diagnosis: Muscle weakness (generalized)  Stiffness of right ankle, not elsewhere classified  Pain in right ankle and joints of right foot     Problem List Patient Active Problem List   Diagnosis Date Noted  . Anxiety, generalized 03/31/2015  . ADD (attention deficit disorder) 03/31/2015    Blythe Stanford, PT DPT 07/31/2017, 1:50 PM  [facility] [address], [place], [address] Phone: [phone]   Fax:  [phone]  Name: Joel Burgess MRN:  [id number] Date of Birth: [birth year]

## 2017-08-05 ENCOUNTER — Ambulatory Visit: Payer: Managed Care, Other (non HMO)

## 2017-08-06 ENCOUNTER — Ambulatory Visit: Payer: Managed Care, Other (non HMO)

## 2017-08-11 ENCOUNTER — Ambulatory Visit: Payer: Managed Care, Other (non HMO)

## 2017-08-11 DIAGNOSIS — M6281 Muscle weakness (generalized): Secondary | ICD-10-CM

## 2017-08-11 DIAGNOSIS — M25571 Pain in right ankle and joints of right foot: Secondary | ICD-10-CM

## 2017-08-11 DIAGNOSIS — M25671 Stiffness of right ankle, not elsewhere classified: Secondary | ICD-10-CM

## 2017-08-11 NOTE — Therapy (Signed)
[facility] [address], [place], [address] Phone: [phone]   Fax:  [phone]  Physical Therapy Treatment  Patient Details  Name: Joel Burgess MRN: [id number] Date of Birth: August 22, 1976 Referring Provider: [name]  Encounter Date: 08/11/2017      PT End of Session - 08/11/17 1533    Visit Number 19   Number of Visits 33   Date for PT Re-Evaluation 08/07/17   Authorization Type no g codes   PT Start Time 1430   PT Stop Time 1515   PT Time Calculation (min) 45 min   Activity Tolerance Patient tolerated treatment well   Behavior During Therapy [facility] for tasks assessed/performed      Past Medical History:  Diagnosis Date  . ADHD (attention deficit hyperactivity disorder)   . Anxiety     Past Surgical History:  Procedure Laterality Date  . APPENDECTOMY    . KNEE SURGERY Right     There were no vitals filed for this visit.      Subjective Assessment - 08/11/17 1531    Subjective Patient states he spent the last week recovering from being sick. Patient feels he has worked out the muscle spasms tht we addressed the previous visit.    Pertinent History [name] is a 41 y.o. male with h/o asthma, anxiety, and ADHD who underwent ORIF with IMN of a right closed tibia shaft fracture and pinning of the distal fibula fracture 08/05/2016 by Dr. Marlou Starks. His date of injury is 08/04/2016 when he was wrestling to control his 14 yo son, who has cognitive impairment. His son subsequently fell on him resulting in the injury. Following surgery, he went for physical therapy at [facility] from October through November 2017. He reports that after November he thought he could continue therapy on his own and was discharged. He continued to walk with a limp and have difficulty with his ankle range of motion and strength. He reports that about 3 weeks ago he had insidious onset of pain and popping  in the right knee and ankle. He recently went back for a follow-up with orthopedics at [facility] and they imaged his leg with well healing fractures and no concerns. They stated that he could return to activities without restriction or limitation. They suggested he restart therapy due to continued limitations in RLE strength and mobility. Script provided for physical therapy to work on ankle/knee motion, leg strengthening, walking, and balance.   Limitations Walking   Patient Stated Goals Walk without a limp, return to running, improve his ability to swim at the [facility]   Currently in Pain? No/denies          TREATMENT:  Dry Needling(2)min unbilled: Patient gives verbal consent for the dry needling to be performed. One 64mm length needle placed along the lateral gastrocnemius on the affected side. Performed for two minutes with the patient positioned in long sitting.  Manual therapy: STM to the gastroc/soleus to decrease pain and spasms in the leg with patient positioned in long sitting utilizing deep/superficial techniques   Therapeutic Exercise Hip abduction at hip machine - 3 x 12 40# Hip extension at hip machine -  x 12 85#, x12 70# Total gym level 14 - quadriceps single leg squats 3 x 15 Total gym level 14 - heel raises single leg 3 x 15   Patient demonstrates increased LE fatigue at the end of the session.  PT Education - 08/11/17 1532    Education provided Yes   Education Details Form/technique with exercise   Person(s) Educated Patient   Methods Explanation;Demonstration   Comprehension Returned demonstration;Verbalized understanding             PT Long Term Goals - 08/11/17 1537      PT LONG TERM GOAL #1   Title Pt will be independent with HEP in order to improve strength and balance in order to decrease fall risk and improve function at home and work.    Time 4   Period Weeks   Status On-going     PT LONG TERM GOAL #2   Title Pt will improve R hip  abduction and extension strength to at least 4+/5 in order to improve gait mechanics and return to patient's goal of running and swimming   Baseline 03/12/17: 4-/5 for abduction and extension; 05/12/17: 03/12/17: 4/5 for abduction and extension; 4+/5 for both movements   Time 8   Period Weeks   Status Achieved     PT LONG TERM GOAL #3   Title Pt will improve R ankle plantarflexion strength so that he can perform at least 10 heel raises in standing in order to improve push off so that he can run   Baseline 03/12/17: unable to clear heel from ground; 05/12/17: able to perform >10rep   Time 8   Period Weeks   Status Achieved     PT LONG TERM GOAL #4   Title Pt will improve R ankle DF AAROM in standing to at least 20 degrees in order to improve gait and running mechanics   Baseline 03/12/17: 14 degrees 05/12/17: 20deg   Time 8   Period Weeks   Status Achieved     PT LONG TERM GOAL #5   Title Pt will increase LEFS by at least 9 points in order to demonstrate significant improvement in lower extremity function.     Baseline 03/20/17: 51/80; 05/12/17: 59/80 06/11/2017 60/80   Time 8   Period Weeks   Status Achieved     PT LONG TERM GOAL #6   Title Patient will be able to run 100 ft without pain to increase effiency at work and return to recreational activties.   Baseline unable to run without pain; Able to run but increased pain with performance; unable to assess today per pt request (07/09/2017)   Time 4   Period Weeks   Status Unable to assess     PT LONG TERM GOAL #7   Title Patient will be able to perform single leg heel raise on the R to improve running ability   Baseline unable to perform; able to raise R heel off the floor about 2 inches but with anterior foot pain (07/09/2017)   Time 4   Period Weeks   Status On-going               Plan - 08/11/17 1533    Clinical Impression Statement Patient continues to have difficulty walking and see little improvement over the past month  secondary to missing visits from feeling ill. Patient demonstrates increased muscular spasms along the lateral aspect of the gastrocnemius upon palpation. Patient's gastrocnemius pain significantly decreased after performing dry needling indicating decreased trigger points and muscle spasms. Patient will benefit from further skilled therapy to return to prior level of function.    Rehab Potential Good   Clinical Impairments Affecting Rehab Potential Positive: motivation, age; Negative: persistent weakness/ROM deficits  PT Frequency 2x / week   PT Duration 4 weeks   PT Treatment/Interventions ADLs/Self Care Home Management;Aquatic Therapy;Biofeedback;Cryotherapy;Electrical Stimulation;Iontophoresis 4mg /ml Dexamethasone;Moist Heat;DME Instruction;Gait training;Stair training;Functional mobility training;Therapeutic activities;Therapeutic exercise;Neuromuscular re-education;Balance training;Patient/family education;Manual techniques;Passive range of motion   PT Next Visit Plan  Measure R ankle AAROM DF in standing, Progress RLE strengthening, assess balance (SLS on RLE), mobilizations for R ankle DF, progress HEP as appropriate (consider progression from bridges to forward or reverse lunges.   PT Home Exercise Plan Heel raises with increased weighting to R side, standing gastroc step stretch, standing soleus stretch, hooklying bridges, lateral lunges   Consulted and Agree with Plan of Care Patient      Patient will benefit from skilled therapeutic intervention in order to improve the following deficits and impairments:  Abnormal gait, Decreased strength, Difficulty walking, Hypomobility, Impaired flexibility, Pain  Visit Diagnosis: Muscle weakness (generalized)  Stiffness of right ankle, not elsewhere classified  Pain in right ankle and joints of right foot     Problem List Patient Active Problem List   Diagnosis Date Noted  . Anxiety, generalized 03/31/2015  . ADD (attention deficit  disorder) 03/31/2015    Blythe Stanford, PT DPT 08/11/2017, 3:38 PM  [place] [address], [place], [address] Phone: [phone]   Fax:  [phone]  Name: Markhi Kleckner MRN: [id number] Date of Birth: 06-27-1976

## 2017-08-11 NOTE — Addendum Note (Signed)
Addended by: Blain Pais on: 08/11/2017 03:42 PM   Modules accepted: Orders

## 2017-08-13 ENCOUNTER — Ambulatory Visit: Payer: Managed Care, Other (non HMO)

## 2017-08-18 ENCOUNTER — Ambulatory Visit: Payer: Managed Care, Other (non HMO)

## 2017-08-20 ENCOUNTER — Ambulatory Visit: Payer: Managed Care, Other (non HMO)

## 2017-08-25 ENCOUNTER — Other Ambulatory Visit: Payer: Self-pay | Admitting: Family Medicine

## 2017-08-26 ENCOUNTER — Ambulatory Visit (INDEPENDENT_AMBULATORY_CARE_PROVIDER_SITE_OTHER): Payer: 59 | Admitting: Psychiatry

## 2017-08-26 ENCOUNTER — Encounter: Payer: Self-pay | Admitting: Psychiatry

## 2017-08-26 VITALS — BP 122/75 | HR 70 | Temp 98.4°F | Wt 172.4 lb

## 2017-08-26 DIAGNOSIS — F902 Attention-deficit hyperactivity disorder, combined type: Secondary | ICD-10-CM | POA: Diagnosis not present

## 2017-08-26 DIAGNOSIS — F411 Generalized anxiety disorder: Secondary | ICD-10-CM

## 2017-08-26 MED ORDER — AMPHETAMINE-DEXTROAMPHETAMINE 30 MG PO TABS: 30.0000 mg | ORAL_TABLET | Freq: Two times a day (BID) | ORAL | 0 refills | Status: DC

## 2017-08-26 NOTE — Telephone Encounter (Signed)
Called in Rx as below.  

## 2017-08-26 NOTE — Progress Notes (Signed)
Follow-up for this patient with attention deficit disorder. No new complaints. Patient reports that things are going well at home. His son has not had any recent behavior problems. 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 Feels confident. Anxiety level controlled. No psychosis. No sleep problems. Tolerating medicine well without side effects. Blood pressure and pulse stable.  Neatly dressed and groomed man. Good eye contact. Normal psychomotor activity. Speech normal in rate and tone. Affect euthymic. Thoughts lucid.  Continue current Adderall dosage. Follow-up 3 months

## 2017-08-26 NOTE — Telephone Encounter (Signed)
Please call in alprazolam and diazepam

## 2017-09-15 ENCOUNTER — Encounter: Payer: Self-pay | Admitting: Family Medicine

## 2017-09-15 ENCOUNTER — Ambulatory Visit (INDEPENDENT_AMBULATORY_CARE_PROVIDER_SITE_OTHER): Payer: 59 | Admitting: Family Medicine

## 2017-09-15 VITALS — BP 106/62 | HR 72 | Temp 98.0°F | Resp 16 | Wt 172.0 lb

## 2017-09-15 DIAGNOSIS — T85848A Pain due to other internal prosthetic devices, implants and grafts, initial encounter: Secondary | ICD-10-CM | POA: Insufficient documentation

## 2017-09-15 DIAGNOSIS — L309 Dermatitis, unspecified: Secondary | ICD-10-CM | POA: Diagnosis not present

## 2017-09-15 MED ORDER — KETOCONAZOLE 2 % EX CREA: 1.0000 "application " | TOPICAL_CREAM | Freq: Two times a day (BID) | CUTANEOUS | 0 refills | Status: DC

## 2017-09-15 MED ORDER — KETOCONAZOLE 2 % EX CREA: 1.0000 "application " | TOPICAL_CREAM | Freq: Every day | CUTANEOUS | 0 refills | Status: DC

## 2017-09-15 NOTE — Progress Notes (Signed)
       Patient: Joel Burgess Male    DOB: January 24, 1976   41 y.o.   MRN: [id number] Visit Date: 09/15/2017  Today's Provider: Lelon Huh, MD   Chief Complaint  Patient presents with  . Rash   Subjective:    Rash  This is a new problem. The current episode started more than 1 month ago. The problem is unchanged. The affected locations include the back. Pertinent negatives include no fatigue or fever.    Patient reports describes the rash as a "circle" on his back. It has not changed in size or color. He reports that it is not itchy or bothersome. He has not used anything OTC for symptoms. He also does not feel like this is an insect bite.   Allergies  Allergen Reactions  . Penicillins Anaphylaxis    Has patient had a PCN reaction causing immediate rash, facial/tongue/throat swelling, SOB or lightheadedness with hypotension: Yes Has patient had a PCN reaction causing severe rash involving mucus membranes or skin necrosis: No Has patient had a PCN reaction that required hospitalization pt not sure Has patient had a PCN reaction occurring within the last 10 years: No If all of the above answers are "NO", then may proceed with Cephalosporin use.     Current Outpatient Prescriptions:  .  albuterol (PROAIR HFA) 108 (90 Base) MCG/ACT inhaler, INHALE TWO PUFFS INTO THE LUNGS EVERY 6 HOURS AS NEEDED FOR WHEEZING, Disp: 8.5 g, Rfl: 5 .  ALPRAZolam (XANAX) 1 MG tablet, TAKE ONE TABLET BY MOUTH TWICE DAILY AS NEEDED, Disp: 60 tablet, Rfl: 5 .  amphetamine-dextroamphetamine (ADDERALL) 30 MG tablet, Take 1 tablet by mouth 2 (two) times daily., Disp: 60 tablet, Rfl: 0 .  diazepam (VALIUM) 10 MG tablet, TAKE ONE TABLET BY MOUTH EVERY DAY AS NEEDED FOR MUSCLE SPASM, Disp: 30 tablet, Rfl: 5  Review of Systems  Constitutional: Negative for activity change, appetite change, chills, diaphoresis, fatigue, fever and unexpected weight change.  Skin: Positive for rash.  Allergic/Immunologic: Negative.   Negative for environmental allergies, food allergies and immunocompromised state.  Neurological: Negative for dizziness.    Social History  Substance Use Topics  . Smoking status: Never Smoker  . Smokeless tobacco: Never Used  . Alcohol use No   Objective:   BP 106/62 (BP Location: Left Arm, Patient Position: Sitting, Cuff Size: Normal)   Pulse 72   Temp 98 F (36.7 C)   Resp 16   Wt 172 lb (78 kg)   SpO2 98%   BMI 26.15 kg/m  Vitals:   09/15/17 1149  BP: 106/62  Pulse: 72  Resp: 16  Temp: 98 F (36.7 C)  SpO2: 98%  Weight: 172 lb (78 kg)     Physical Exam  Skin: well demarcated round macular erythematous lesion with central clear crescent. (see photo).     Assessment & Plan:     1. Dermatitis He denies any known trauma or pressure to area. Has been stable and unchanged for two weeks by pt report. Will treat for possible tinea and recheck in two weeks.  - ketoconazole (NIZORAL) 2 % cream; Apply 1 application topically 2 (two) times daily.  Dispense: 15 g; Refill: 0       Lelon Huh, MD  [facility]

## 2017-09-17 ENCOUNTER — Ambulatory Visit: Payer: Managed Care, Other (non HMO) | Attending: Physician Assistant

## 2017-09-17 DIAGNOSIS — M6281 Muscle weakness (generalized): Secondary | ICD-10-CM | POA: Insufficient documentation

## 2017-09-17 DIAGNOSIS — M25571 Pain in right ankle and joints of right foot: Secondary | ICD-10-CM | POA: Diagnosis present

## 2017-09-17 DIAGNOSIS — M25671 Stiffness of right ankle, not elsewhere classified: Secondary | ICD-10-CM | POA: Insufficient documentation

## 2017-09-17 NOTE — Therapy (Signed)
[place] [facility] [address], [place], [address] Phone: [phone]   Fax:  [phone]  Physical Therapy Treatment  Patient Details  Name: Joel Burgess MRN: [id number] Date of Birth: 07/22/1976 Referring Provider: [name]  Encounter Date: 09/17/2017      PT End of Session - 09/17/17 0946    Visit Number 20   Number of Visits 33   Date for PT Re-Evaluation 10/29/17   Authorization Type no g codes   PT Start Time 0915   PT Stop Time 1000   PT Time Calculation (min) 45 min   Activity Tolerance Patient tolerated treatment well   Behavior During Therapy [facility] for tasks assessed/performed      Past Medical History:  Diagnosis Date  . ADHD (attention deficit hyperactivity disorder)   . Anxiety     Past Surgical History:  Procedure Laterality Date  . APPENDECTOMY    . KNEE SURGERY Right     There were no vitals filed for this visit.      Subjective Assessment - 09/17/17 0917    Subjective Patient reports he returned to the physician secondary to increased pain along the inside of his ankle joint. Patient states it hurts "along his bone" and does not know what therapy will do to help. Patient states he will be having surgery to remove the screws but they have not yet determined a date for the surgery.     Pertinent History Joel Burgess is a 41 y.o. male with h/o asthma, anxiety, and ADHD who underwent ORIF with IMN of a right closed tibia shaft fracture and pinning of the distal fibula fracture 08/05/2016 by Dr. Marlou Starks. His date of injury is 08/04/2016 when he was wrestling to control his 88 yo son, who has cognitive impairment. His son subsequently fell on him resulting in the injury. Following surgery, he went for physical therapy at [facility] from October through November 2017. He reports that after November he thought he could continue therapy on his own and was discharged. He  continued to walk with a limp and have difficulty with his ankle range of motion and strength. He reports that about 3 weeks ago he had insidious onset of pain and popping in the right knee and ankle. He recently went back for a follow-up with orthopedics at [facility] and they imaged his leg with well healing fractures and no concerns. They stated that he could return to activities without restriction or limitation. They suggested he restart therapy due to continued limitations in RLE strength and mobility. Script provided for physical therapy to work on ankle/knee motion, leg strengthening, walking, and balance.   Limitations Walking   How long can you walk comfortably? 2 hours   Patient Stated Goals Walk without a limp, return to running, improve his ability to swim at the [facility]   Currently in Pain? No/denies   Pain Score --  9/ 10 after walking for 2 hours   Pain Location Ankle   Pain Orientation Right   Pain Descriptors / Indicators Aching   Pain Type Chronic pain   Pain Frequency Intermittent        TREATMENT: Observation: Dorsiflexion - R: -5 to 0 ; L 5 to 12  Manual Therapy: STM performed to patient's anterior ankle with patient positioned in long sitting to decreased spasms and improve soft tissue length. Mobilizations A-P to the anterior aspect of the ankle to improve ankle mobility and improve dorsiflexion  mobilization -- Performed distraction into mobilization 3 x 60 sec  Therapeutic Exercise Standing mobilization with movement dorsiflexion on a step - 3  x 45 sec with belt Dorsiflexion with OP with PT help - x 30  CKC ankle dorsiflexion stretch -- x 20; initally performed 10sec holds then performed 1 sec holds secondary to increased ankle pain with 10sec holds Heel raises B -- x 15 Unilateral heel raises -- x 5  Patient demonstrates increased fatigue and slight increase in pain with ambulation        PT Education - 09/17/17 0945    Education provided Yes   Education  Details form/technique with exercise   Person(s) Educated Patient   Methods Explanation;Demonstration   Comprehension Verbalized understanding;Returned demonstration             PT Long Term Goals - 09/17/17 1003      PT LONG TERM GOAL #1   Title Pt will be independent with HEP in order to improve strength and balance in order to decrease fall risk and improve function at home and work.    Time 4   Period Weeks   Status On-going     PT LONG TERM GOAL #2   Title Pt will improve R hip abduction and extension strength to at least 4+/5 in order to improve gait mechanics and return to patient's goal of running and swimming   Baseline 03/12/17: 4-/5 for abduction and extension; 05/12/17: 03/12/17: 4/5 for abduction and extension; 4+/5 for both movements   Time 8   Period Weeks   Status Achieved     PT LONG TERM GOAL #3   Title Pt will improve R ankle plantarflexion strength so that he can perform at least 10 heel raises in standing in order to improve push off so that he can run   Baseline 03/12/17: unable to clear heel from ground; 05/12/17: able to perform >10rep   Time 8   Period Weeks   Status Achieved     PT LONG TERM GOAL #4   Title Pt will improve R ankle DF AAROM in standing to at least 20 degrees in order to improve gait and running mechanics   Baseline 03/12/17: 14 degrees 05/12/17: 20deg   Time 8   Period Weeks   Status Achieved     PT LONG TERM GOAL #5   Title Pt will increase LEFS by at least 9 points in order to demonstrate significant improvement in lower extremity function.     Baseline 03/20/17: 51/80; 05/12/17: 59/80 06/11/2017 60/80   Time 8   Period Weeks   Status Achieved     PT LONG TERM GOAL #6   Title Patient will be able to run 100 ft without pain to increase effiency at work and return to recreational activties.   Baseline unable to run without pain; Able to run but increased pain with performance; unable to assess today per pt request (07/09/2017)   Time 4    Period Weeks   Status Unable to assess     PT LONG TERM GOAL #7   Title Patient will be able to perform single leg heel raise on the R to improve running ability   Baseline unable to perform; able to raise R heel off the floor about 2 inches but with anterior foot pain (09/17/2017)   Time 4   Period Weeks   Status On-going               Plan - 09/17/17  0948    Clinical Impression Statement Patient demonstrates significant decrease in ankle dorsiflexion mobility on the R LE indicating poor motor control and decreased joint motion. Patient demonstrates decreased plantarflexion strength, however, I feel decreased strength may be limited secondary to pain with single leg heel raises. Patient unable to perform single leg heel raise without increase in pain. Patient will benefit from further skilled therapy focused on improving limitations to return to prior level of function.    Rehab Potential Good   Clinical Impairments Affecting Rehab Potential Positive: motivation, age; Negative: persistent weakness/ROM deficits   PT Frequency 2x / week   PT Duration 4 weeks   PT Treatment/Interventions ADLs/Self Care Home Management;Aquatic Therapy;Biofeedback;Cryotherapy;Electrical Stimulation;Iontophoresis 4mg /ml Dexamethasone;Moist Heat;DME Instruction;Gait training;Stair training;Functional mobility training;Therapeutic activities;Therapeutic exercise;Neuromuscular re-education;Balance training;Patient/family education;Manual techniques;Passive range of motion   PT Next Visit Plan  Measure R ankle AAROM DF in standing, Progress RLE strengthening, assess balance (SLS on RLE), mobilizations for R ankle DF, progress HEP as appropriate (consider progression from bridges to forward or reverse lunges.   PT Home Exercise Plan Heel raises with increased weighting to R side, standing gastroc step stretch, standing soleus stretch, hooklying bridges, lateral lunges   Consulted and Agree with Plan of Care  Patient      Patient will benefit from skilled therapeutic intervention in order to improve the following deficits and impairments:  Abnormal gait, Decreased strength, Difficulty walking, Hypomobility, Impaired flexibility, Pain  Visit Diagnosis: Muscle weakness (generalized) - Plan: PT plan of care cert/re-cert  Stiffness of right ankle, not elsewhere classified - Plan: PT plan of care cert/re-cert  Pain in right ankle and joints of right foot - Plan: PT plan of care cert/re-cert     Problem List Patient Active Problem List   Diagnosis Date Noted  . Anxiety, generalized 03/31/2015  . ADD (attention deficit disorder) 03/31/2015    Blythe Stanford, PT DPT 09/17/2017, 10:20 AM  [place] [facility] [address], [place], [address] Phone: [phone]   Fax:  [phone]  Name: Seth Higginbotham MRN: [id number] Date of Birth: 1976-12-09

## 2017-09-24 ENCOUNTER — Encounter: Payer: 59 | Admitting: Physical Therapy

## 2017-09-29 ENCOUNTER — Ambulatory Visit: Payer: Self-pay | Admitting: Family Medicine

## 2017-09-30 ENCOUNTER — Ambulatory Visit: Payer: 59

## 2017-10-07 ENCOUNTER — Ambulatory Visit: Payer: 59 | Attending: Physician Assistant

## 2017-10-07 DIAGNOSIS — M25571 Pain in right ankle and joints of right foot: Secondary | ICD-10-CM | POA: Diagnosis present

## 2017-10-07 DIAGNOSIS — M6281 Muscle weakness (generalized): Secondary | ICD-10-CM | POA: Insufficient documentation

## 2017-10-07 DIAGNOSIS — M25671 Stiffness of right ankle, not elsewhere classified: Secondary | ICD-10-CM

## 2017-10-07 NOTE — Addendum Note (Signed)
Addended by: Blain Pais on: 10/07/2017 03:34 PM   Modules accepted: Orders

## 2017-10-07 NOTE — Therapy (Signed)
[facility] [id number], [place], [address] Phone: [phone]   Fax:  [phone]  Physical Therapy Treatment/Reevalution   Patient Details  Name: Joel Burgess MRN: [id number] Date of Birth: 09-08-1976 Referring Provider: [name]  Encounter Date: 10/07/2017      PT End of Session - 10/07/17 1317    Visit Number 21   Number of Visits 33   Date for PT Re-Evaluation 10/29/17   Authorization Type no g codes   PT Start Time 0907   PT Stop Time 0945   PT Time Calculation (min) 38 min   Activity Tolerance Patient tolerated treatment well   Behavior During Therapy [facility] for tasks assessed/performed      Past Medical History:  Diagnosis Date  . ADHD (attention deficit hyperactivity disorder)   . Anxiety     Past Surgical History:  Procedure Laterality Date  . APPENDECTOMY    . KNEE SURGERY Right     There were no vitals filed for this visit.      Subjective Assessment - 10/07/17 1310    Subjective Patient reports he went to the physicians office to have surgery on removing the two bolts along the medial malleous  on his affected side. Patient reports he has not been working and can only lightly walk to avoid onset of pain. Patient is worried about lifting heavy items at work and reports increased pain when trying to perform stairs and lifting his weight up with his heels. Patient reports the pain has been improving, but he continues to struggle with performing functional activties such as walking for long periods of time and performing asending of the stairs.    Pertinent History [name] is a 41 y.o. male with h/o asthma, anxiety, and ADHD who underwent ORIF with IMN of a right closed tibia shaft fracture and pinning of the distal fibula fracture 08/05/2016 by Dr. Marlou Starks. His date of injury is 08/04/2016 when he was wrestling to control his 66 yo son, who has cognitive impairment. His son  subsequently fell on him resulting in the injury. Following surgery, he went for physical therapy at [place] from October through November 2017. He reports that after November he thought he could continue therapy on his own and was discharged. He continued to walk with a limp and have difficulty with his ankle range of motion and strength. He reports that about 3 weeks ago he had insidious onset of pain and popping in the right knee and ankle. He recently went back for a follow-up with orthopedics at [facility] and they imaged his leg with well healing fractures and no concerns. They stated that he could return to activities without restriction or limitation. They suggested he restart therapy due to continued limitations in RLE strength and mobility. Script provided for physical therapy to work on ankle/knee motion, leg strengthening, walking, and balance.   Limitations Walking   How long can you walk comfortably? 2 hours   Patient Stated Goals Walk without a limp, return to running, improve his ability to swim at the [facility]   Currently in Pain? No/denies      OBSERVATION: AROM Ankle R : Dorsi: 2deg, eversion: 15deg, inversion: 35; plantar: 55deg; Ankle L:  Dorsi: 10deg, eversion: 15deg, inversion: 35; plantar: 55deg  MMT: Ankle R: Dorsi: 4+/5, Plantar: 3+/5, inversion: 5/5, eversion: 4/5; Ankle L: WNL  SLS on R: 6 sec; SLS on L: >30sec  TREATMENT: Manual therapy: STM  to patient soleus to patient positioned in long sitting to decrease increased pain and spasms as well as the peroneal musculature. A-P Pressure grade II - III - 3 x 30 sec on the R foot  Therapeutic Exercise Standing calf stretch with foot elevated onto a surface -- 3 x 45 sec holds Standing knee the wall CKC mobilizations -- 3 x 40sec holds Resisted plantar flexion with knee bent -- 2 x 50 Single leg stance on R-- 2 x 30 Manually resisted plantar in long sitting -- 2 x 30 repetioins  Patient reports light fatigue  at the end of the session        PT Education - 10/07/17 1317    Education provided Yes   Education Details form/technique with exercise   Person(s) Educated Patient   Methods Demonstration;Explanation   Comprehension Verbalized understanding;Returned demonstration             PT Long Term Goals - 09/17/17 1003      PT LONG TERM GOAL #1   Title Pt will be independent with HEP in order to improve strength and balance in order to decrease fall risk and improve function at home and work.    Time 4   Period Weeks   Status On-going     PT LONG TERM GOAL #2   Title Pt will improve R hip abduction and extension strength to at least 4+/5 in order to improve gait mechanics and return to patient's goal of running and swimming   Baseline 03/12/17: 4-/5 for abduction and extension; 05/12/17: 03/12/17: 4/5 for abduction and extension; 4+/5 for both movements   Time 8   Period Weeks   Status Achieved     PT LONG TERM GOAL #3   Title Pt will improve R ankle plantarflexion strength so that he can perform at least 10 heel raises in standing in order to improve push off so that he can run   Baseline 03/12/17: unable to clear heel from ground; 05/12/17: able to perform >10rep   Time 8   Period Weeks   Status Achieved     PT LONG TERM GOAL #4   Title Pt will improve R ankle DF AAROM in standing to at least 20 degrees in order to improve gait and running mechanics   Baseline 03/12/17: 14 degrees 05/12/17: 20deg   Time 8   Period Weeks   Status Achieved     PT LONG TERM GOAL #5   Title Pt will increase LEFS by at least 9 points in order to demonstrate significant improvement in lower extremity function.     Baseline 03/20/17: 51/80; 05/12/17: 59/80 06/11/2017 60/80   Time 8   Period Weeks   Status Achieved     PT LONG TERM GOAL #6   Title Patient will be able to run 100 ft without pain to increase effiency at work and return to recreational activties.   Baseline unable to run without pain;  Able to run but increased pain with performance; unable to assess today per pt request (07/09/2017)   Time 4   Period Weeks   Status Unable to assess     PT LONG TERM GOAL #7   Title Patient will be able to perform single leg heel raise on the R to improve running ability   Baseline unable to perform; able to raise R heel off the floor about 2 inches but with anterior foot pain (09/17/2017)   Time 4   Period Weeks   Status  On-going               Plan - 10/07/17 1318    Clinical Impression Statement Patient demonstrates improvement in dorsiflexion angle on the left side compared to previous sessions, indicating improvement with the surgery. Patient demonstrates decreased balance on the affected side and increased weakness with ankle eversion and plantarflexion on the affected side. Patient demonstrates increased ankle dysfunction with previously included weakness and decreased AROM with dorsiflexion. Patient will benefit from further skilled therapy to return to prior level of function.    Rehab Potential Good   Clinical Impairments Affecting Rehab Potential Positive: motivation, age; Negative: persistent weakness/ROM deficits   PT Frequency 2x / week   PT Duration 4 weeks   PT Treatment/Interventions ADLs/Self Care Home Management;Aquatic Therapy;Biofeedback;Cryotherapy;Electrical Stimulation;Iontophoresis 4mg /ml Dexamethasone;Moist Heat;DME Instruction;Gait training;Stair training;Functional mobility training;Therapeutic activities;Therapeutic exercise;Neuromuscular re-education;Balance training;Patient/family education;Manual techniques;Passive range of motion   PT Next Visit Plan  Measure R ankle AAROM DF in standing, Progress RLE strengthening, assess balance (SLS on RLE), mobilizations for R ankle DF, progress HEP as appropriate (consider progression from bridges to forward or reverse lunges.   PT Home Exercise Plan Heel raises with increased weighting to R side, standing gastroc step  stretch, standing soleus stretch, hooklying bridges, lateral lunges   Consulted and Agree with Plan of Care Patient      Patient will benefit from skilled therapeutic intervention in order to improve the following deficits and impairments:  Abnormal gait, Decreased strength, Difficulty walking, Hypomobility, Impaired flexibility, Pain  Visit Diagnosis: Muscle weakness (generalized)  Stiffness of right ankle, not elsewhere classified  Pain in right ankle and joints of right foot     Problem List Patient Active Problem List   Diagnosis Date Noted  . Anxiety, generalized 03/31/2015  . ADD (attention deficit disorder) 03/31/2015    Blythe Stanford, PT DPT 10/07/2017, 1:29 PM  [place] [facility] [address], [place], [address] Phone: [phone]   Fax:  [phone]  Name: Joel Burgess MRN: [id number] Date of Birth: Jan 13, 1976

## 2017-10-14 ENCOUNTER — Ambulatory Visit: Payer: 59

## 2017-10-15 ENCOUNTER — Ambulatory Visit: Payer: 59

## 2017-10-15 DIAGNOSIS — M25671 Stiffness of right ankle, not elsewhere classified: Secondary | ICD-10-CM

## 2017-10-15 DIAGNOSIS — M25571 Pain in right ankle and joints of right foot: Secondary | ICD-10-CM

## 2017-10-15 DIAGNOSIS — M6281 Muscle weakness (generalized): Secondary | ICD-10-CM

## 2017-10-15 NOTE — Therapy (Signed)
[facility] [address] [place], [address] Phone: [phone]   Fax:  [phone]  Physical Therapy Treatment  Patient Details  Name: Joel Burgess MRN: [id number] Date of Birth: 1976-09-15 Referring Provider: [name]  Encounter Date: 10/15/2017      PT End of Session - 10/15/17 1524    Visit Number 22   Number of Visits 33   Date for PT Re-Evaluation 11/18/17   Authorization Type no g codes   PT Start Time 08-Apr-1344   PT Stop Time 1417   PT Time Calculation (min) 32 min   Activity Tolerance Patient tolerated treatment well   Behavior During Therapy [facility] for tasks assessed/performed      Past Medical History:  Diagnosis Date  . ADHD (attention deficit hyperactivity disorder)   . Anxiety     Past Surgical History:  Procedure Laterality Date  . APPENDECTOMY    . KNEE SURGERY Right     There were no vitals filed for this visit.      Subjective Assessment - 10/15/17 1521    Subjective Patient reports he's been performing exercises frequently at home; patient states his lower leg has increased fatigue throughout it.    Pertinent History Joel Burgess is a 41 y.o. male with h/o asthma, anxiety, and ADHD who underwent ORIF with IMN of a right closed tibia shaft fracture and pinning of the distal fibula fracture 08/05/2016 by Dr. Marlou Starks. His date of injury is 08/04/2016 when he was wrestling to control his 36 yo son, who has cognitive impairment. His son subsequently fell on him resulting in the injury. Following surgery, he went for physical therapy at [place] from October through November 2017. He reports that after November he thought he could continue therapy on his own and was discharged. He continued to walk with a limp and have difficulty with his ankle range of motion and strength. He reports that about 3 weeks ago he had insidious onset of pain and popping in the right knee  and ankle. He recently went back for a follow-up with orthopedics at [facility] and they imaged his leg with well healing fractures and no concerns. They stated that he could return to activities without restriction or limitation. They suggested he restart therapy due to continued limitations in RLE strength and mobility. Script provided for physical therapy to work on ankle/knee motion, leg strengthening, walking, and balance.   Limitations Walking   How long can you walk comfortably? 2 hours   Patient Stated Goals Walk without a limp, return to running, improve his ability to swim at the [facility]   Currently in Pain? No/denies      TREATMENT: Manual therapy: STM to patient soleus to patient positioned in long sitting to decrease increased pain and spasms as well as the peroneal musculature. A-P Pressure grade II - III - 3 x 30 sec on the R foot  Therapeutic Exercise Manually resisted dorsiflexion in sitting -- x  With 5 sec holds Manually resisted inversion in long sitting -- x 10 with 5 sec holds Manually resisted eversion in dorsiflexion -- x 10 with 5 sec holds Manually resisted plantar in long sitting -- 2 x 30 repetioins  Patient reports light fatigue at the end of the session        PT Education - 10/15/17 1523    Education provided Yes   Education Details reinforced HEP   Person(s) Educated Patient   Methods  Explanation;Demonstration   Comprehension Verbalized understanding;Returned demonstration             PT Long Term Goals - 10/07/17 1528      PT LONG TERM GOAL #1   Title Pt will be independent with HEP in order to improve strength and balance in order to  improve function at home and work.    Time 4   Period Weeks   Status New     PT LONG TERM GOAL #2   Title Patient will achieve R ankle dorsiflexion of >10deg to indicate less pain with squatting performance and decrease risk of injury.   Baseline 2 deg R ankle dorsiflexion   Time 6   Period Weeks   Status New      PT LONG TERM GOAL #3   Title Pt will improve R ankle plantarflexion strength so that he can perform at least 20 single leg heel raises in standing on the R side in order to improve push off so that he can run   Baseline Unable to perform   Time 6   Period Weeks   Status New     PT LONG TERM GOAL #4   Title Patient will be able to run 100 ft without pain to increase effiency at work and return to recreational activties.   Baseline unable to run   Time 6   Period Weeks   Status New               Plan - 10/15/17 1524    Clinical Impression Statement Patient further demonstrates improvement in dorsiflexion increasing from 2 to 6 degrees on the affected side indicating functional carryover between sessions. Although patient is improving, he continues to demonstrates increased weakness throughout the ankle/lower leg. Patient will benefit from further skilled therapy to return to prior level of function.    Rehab Potential Good   Clinical Impairments Affecting Rehab Potential Positive: motivation, age; Negative: persistent weakness/ROM deficits   PT Frequency 2x / week   PT Duration 4 weeks   PT Treatment/Interventions ADLs/Self Care Home Management;Aquatic Therapy;Biofeedback;Cryotherapy;Electrical Stimulation;Iontophoresis 4mg /ml Dexamethasone;Moist Heat;DME Instruction;Gait training;Stair training;Functional mobility training;Therapeutic activities;Therapeutic exercise;Neuromuscular re-education;Balance training;Patient/family education;Manual techniques;Passive range of motion   PT Next Visit Plan  Measure R ankle AAROM DF in standing, Progress RLE strengthening, assess balance (SLS on RLE), mobilizations for R ankle DF, progress HEP as appropriate (consider progression from bridges to forward or reverse lunges.   PT Home Exercise Plan Heel raises with increased weighting to R side, standing gastroc step stretch, standing soleus stretch, hooklying bridges, lateral lunges    Consulted and Agree with Plan of Care Patient      Patient will benefit from skilled therapeutic intervention in order to improve the following deficits and impairments:  Abnormal gait, Decreased strength, Difficulty walking, Hypomobility, Impaired flexibility, Pain  Visit Diagnosis: Stiffness of right ankle, not elsewhere classified  Pain in right ankle and joints of right foot  Muscle weakness (generalized)     Problem List Patient Active Problem List   Diagnosis Date Noted  . Anxiety, generalized 03/31/2015  . ADD (attention deficit disorder) 03/31/2015    Blythe Stanford, PT DPT 10/15/2017, 3:28 PM  [facility] [address] [place], [address] Phone: [phone]   Fax:  [phone]  Name: Eliceo Gladu MRN: [id number] Date of Birth: 03/16/1976

## 2017-10-20 ENCOUNTER — Ambulatory Visit: Payer: 59

## 2017-10-21 ENCOUNTER — Ambulatory Visit: Payer: 59 | Admitting: Physical Therapy

## 2017-10-23 ENCOUNTER — Other Ambulatory Visit: Payer: Self-pay | Admitting: Psychiatry

## 2017-10-23 ENCOUNTER — Ambulatory Visit: Payer: 59 | Attending: Physician Assistant

## 2017-10-23 DIAGNOSIS — M6281 Muscle weakness (generalized): Secondary | ICD-10-CM | POA: Diagnosis present

## 2017-10-23 DIAGNOSIS — M25671 Stiffness of right ankle, not elsewhere classified: Secondary | ICD-10-CM | POA: Diagnosis present

## 2017-10-23 DIAGNOSIS — M25571 Pain in right ankle and joints of right foot: Secondary | ICD-10-CM | POA: Diagnosis present

## 2017-10-23 NOTE — Therapy (Signed)
[facility] [address], [place], [address] Phone: [phone]   Fax:  [phone]  Physical Therapy Treatment  Patient Details  Name: Joel Burgess MRN: [id number] Date of Birth: [birth year] Referring Provider: [name]  Encounter Date: 10/23/2017      PT End of Session - 10/23/17 1101    Visit Number 23   Number of Visits 33   Date for PT Re-Evaluation 11/18/17   Authorization Type no g codes   PT Start Time 0908   PT Stop Time 0949   PT Time Calculation (min) 41 min   Activity Tolerance Patient tolerated treatment well   Behavior During Therapy [facility] for tasks assessed/performed      Past Medical History:  Diagnosis Date  . ADHD (attention deficit hyperactivity disorder)   . Anxiety     Past Surgical History:  Procedure Laterality Date  . APPENDECTOMY    . KNEE SURGERY Right     There were no vitals filed for this visit.      Subjective Assessment - 10/23/17 1038    Subjective Patient reports he's been performing exercises at home and states he's been able to walk with less of a "limp". Patient reports his calf is a little sore on the affected side from walking and picking up sticks in his yard.     Pertinent History Joel Burgess is a 41 y.o. male with h/o asthma, anxiety, and ADHD who underwent ORIF with IMN of a right closed tibia shaft fracture and pinning of the distal fibula fracture 08/05/2016 by Dr. Marlou Starks. His date of injury is 08/04/2016 when he was wrestling to control his 53 yo son, who has cognitive impairment. His son subsequently fell on him resulting in the injury. Following surgery, he went for physical therapy at [facility] from October through November 2017. He reports that after November he thought he could continue therapy on his own and was discharged. He continued to walk with a limp and have difficulty with his ankle range of motion and strength. He  reports that about 3 weeks ago he had insidious onset of pain and popping in the right knee and ankle. He recently went back for a follow-up with orthopedics at [facility] and they imaged his leg with well healing fractures and no concerns. They stated that he could return to activities without restriction or limitation. They suggested he restart therapy due to continued limitations in RLE strength and mobility. Script provided for physical therapy to work on ankle/knee motion, leg strengthening, walking, and balance.   Limitations Walking   How long can you walk comfortably? 2 hours   Patient Stated Goals Walk without a limp, return to running, improve his ability to swim at the [facility]   Currently in Pain? No/denies      TREATMENT: Manual therapy: STM to patient soleus to patient positioned in long sitting to decrease increased pain and spasms as well as the peroneal musculature. A-P Pressure grade II - III - 3 x 30 sec on the R foot  Therapeutic Exercise Manually resisted dorsiflexion in sitting -- x 2  With 5 sec holds Resisted toe flexion against manual resistance with 5 sec holds x 5 Resisted toe flexion with GTB -- x 15 with patient in long sitting  Dry Needling:(65min unbilled) One needle placed into the gastroc/soleus on the affected side with patient positioned in side lying to help decrease muscular spasms and pain along the area.  Patient verbally consents to therapy.   Patient reports light fatigue at the end of the session       PT Education - 10/23/17 1100    Education provided Yes   Education Details reinforced HEP performance   Person(s) Educated Patient   Methods Explanation;Demonstration   Comprehension Verbalized understanding;Returned demonstration             PT Long Term Goals - 10/07/17 1528      PT LONG TERM GOAL #1   Title Pt will be independent with HEP in order to improve strength and balance in order to  improve function at home and work.    Time 4    Period Weeks   Status New     PT LONG TERM GOAL #2   Title Patient will achieve R ankle dorsiflexion of >10deg to indicate less pain with squatting performance and decrease risk of injury.   Baseline 2 deg R ankle dorsiflexion   Time 6   Period Weeks   Status New     PT LONG TERM GOAL #3   Title Pt will improve R ankle plantarflexion strength so that he can perform at least 20 single leg heel raises in standing on the R side in order to improve push off so that he can run   Baseline Unable to perform   Time 6   Period Weeks   Status New     PT LONG TERM GOAL #4   Title Patient will be able to run 100 ft without pain to increase effiency at work and return to recreational activties.   Baseline unable to run   Time 6   Period Weeks   Status New               Plan - 10/23/17 1109    Clinical Impression Statement Patient continues to deonstrate improvement in Dorsiflexion compared to previous visits with ability to acheive 10 degrees of DF passively and 7 deg of motion activily. Patient demosntrates increased soreness along the posterior aspect of his calf along the gastrocnemius and the flexor digitorum longus. Patient demonstrates decreased tightness and pain after performing dry needling to the area and will benefit from further skilled therapy to return to prior level of function.    Rehab Potential Good   Clinical Impairments Affecting Rehab Potential Positive: motivation, age; Negative: persistent weakness/ROM deficits   PT Frequency 2x / week   PT Duration 4 weeks   PT Treatment/Interventions ADLs/Self Care Home Management;Aquatic Therapy;Biofeedback;Cryotherapy;Electrical Stimulation;Iontophoresis 4mg /ml Dexamethasone;Moist Heat;DME Instruction;Gait training;Stair training;Functional mobility training;Therapeutic activities;Therapeutic exercise;Neuromuscular re-education;Balance training;Patient/family education;Manual techniques;Passive range of motion   PT Next Visit  Plan  Measure R ankle AAROM DF in standing, Progress RLE strengthening, assess balance (SLS on RLE), mobilizations for R ankle DF, progress HEP as appropriate (consider progression from bridges to forward or reverse lunges.   PT Home Exercise Plan Heel raises with increased weighting to R side, standing gastroc step stretch, standing soleus stretch, hooklying bridges, lateral lunges   Consulted and Agree with Plan of Care Patient      Patient will benefit from skilled therapeutic intervention in order to improve the following deficits and impairments:  Abnormal gait, Decreased strength, Difficulty walking, Hypomobility, Impaired flexibility, Pain  Visit Diagnosis: Stiffness of right ankle, not elsewhere classified  Pain in right ankle and joints of right foot  Muscle weakness (generalized)     Problem List Patient Active Problem List   Diagnosis Date Noted  . Anxiety, generalized  03/31/2015  . ADD (attention deficit disorder) 03/31/2015    Blythe Stanford, PT DPT 10/23/2017, 11:21 AM  [facility] [address], [place], [address] Phone: [phone]   Fax:  [phone]  Name: Jagdeep Ancheta MRN: [id number] Date of Birth: 09/05/1976

## 2017-10-27 ENCOUNTER — Ambulatory Visit: Payer: 59

## 2017-10-30 ENCOUNTER — Ambulatory Visit: Payer: 59 | Admitting: Physical Therapy

## 2017-11-05 ENCOUNTER — Ambulatory Visit: Payer: 59

## 2017-11-25 ENCOUNTER — Ambulatory Visit (INDEPENDENT_AMBULATORY_CARE_PROVIDER_SITE_OTHER): Payer: 59 | Admitting: Psychiatry

## 2017-11-25 ENCOUNTER — Encounter: Payer: Self-pay | Admitting: Psychiatry

## 2017-11-25 ENCOUNTER — Other Ambulatory Visit: Payer: Self-pay

## 2017-11-25 VITALS — BP 128/80 | HR 77 | Temp 98.6°F | Wt 172.2 lb

## 2017-11-25 DIAGNOSIS — F902 Attention-deficit hyperactivity disorder, combined type: Secondary | ICD-10-CM | POA: Diagnosis not present

## 2017-11-25 DIAGNOSIS — F411 Generalized anxiety disorder: Secondary | ICD-10-CM | POA: Diagnosis not present

## 2017-11-25 NOTE — Progress Notes (Signed)
Follow-up for 41 year old man with ADHD.  He has no new complaints.  Mood has been good.  No major depression no mood swings.  Attention and focus are good.  No problems with appetite.  Sleep is adequate.  No signs of psychotic or angry thinking.  Neatly dressed and groomed.  Good eye contact normal psychomotor activity.  Speech normal rate tone and volume.  Affect euthymic.  Thoughts lucid no loosening of associations no delusions.  Alert and oriented x4.  Good judgment and insight.  No change to medication plan.  Review use of medication and side effects.  Continue Adderall 30 mg twice a day prescriptions given for 3 consecutive months follow-up in 3 months.

## 2017-12-22 ENCOUNTER — Encounter: Payer: Self-pay | Admitting: Family Medicine

## 2017-12-22 ENCOUNTER — Ambulatory Visit: Payer: 59 | Admitting: Family Medicine

## 2017-12-22 ENCOUNTER — Encounter: Payer: Self-pay | Admitting: *Deleted

## 2017-12-22 VITALS — BP 104/60 | HR 72 | Temp 98.3°F | Resp 16 | Wt 167.0 lb

## 2017-12-22 DIAGNOSIS — J019 Acute sinusitis, unspecified: Secondary | ICD-10-CM | POA: Diagnosis not present

## 2017-12-22 DIAGNOSIS — J40 Bronchitis, not specified as acute or chronic: Secondary | ICD-10-CM | POA: Diagnosis not present

## 2017-12-22 MED ORDER — AZITHROMYCIN 250 MG PO TABS: ORAL_TABLET | ORAL | 0 refills | Status: AC

## 2017-12-22 NOTE — Progress Notes (Signed)
Patient: Joel Burgess Male    DOB: 12/10/1976   41 y.o.   MRN: [id number] Visit Date: 12/22/2017  Today's Provider: Lelon Huh, MD   Chief Complaint  Patient presents with  . Cough   Subjective:    Patient has had cough and nasal congestion for 3 days. Cough is slightly productive, home tempts around 99.9 and sweating. Patient also feels fatigued. Patient has been taking otc mucinex and ibuprofen with no relief.    Cough  This is a new problem. The current episode started in the past 7 days (3 days ). The problem has been unchanged. The cough is productive of sputum. Associated symptoms include nasal congestion and sweats. Pertinent negatives include no chest pain, chills, ear congestion, ear pain, fever, headaches, heartburn, hemoptysis, myalgias, postnasal drip, rash, rhinorrhea, sore throat, shortness of breath, weight loss or wheezing. Treatments tried: mucinex and ibuprofen. The treatment provided no relief. There is no history of asthma, bronchiectasis, bronchitis, COPD, emphysema, environmental allergies or pneumonia.      Allergies  Allergen Reactions  . Penicillins Anaphylaxis    Has patient had a PCN reaction causing immediate rash, facial/tongue/throat swelling, SOB or lightheadedness with hypotension: Yes Has patient had a PCN reaction causing severe rash involving mucus membranes or skin necrosis: No Has patient had a PCN reaction that required hospitalization pt not sure Has patient had a PCN reaction occurring within the last 10 years: No If all of the above answers are "NO", then may proceed with Cephalosporin use.     Current Outpatient Medications:  .  albuterol (PROAIR HFA) 108 (90 Base) MCG/ACT inhaler, INHALE TWO PUFFS INTO THE LUNGS EVERY 6 HOURS AS NEEDED FOR WHEEZING, Disp: 8.5 g, Rfl: 5 .  ALPRAZolam (XANAX) 1 MG tablet, TAKE ONE TABLET BY MOUTH TWICE DAILY AS NEEDED, Disp: 60 tablet, Rfl: 5 .  amphetamine-dextroamphetamine (ADDERALL) 30 MG  tablet, Take 1 tablet by mouth 2 (two) times daily., Disp: 60 tablet, Rfl: 0 .  diazepam (VALIUM) 10 MG tablet, TAKE ONE TABLET BY MOUTH EVERY DAY AS NEEDED FOR MUSCLE SPASM, Disp: 30 tablet, Rfl: 5 .  ketoconazole (NIZORAL) 2 % cream, Apply 1 application topically 2 (two) times daily., Disp: 15 g, Rfl: 0  Review of Systems  Constitutional: Negative for appetite change, chills, fever and weight loss.  HENT: Positive for congestion and sinus pressure. Negative for ear pain, postnasal drip, rhinorrhea and sore throat.   Respiratory: Positive for cough. Negative for hemoptysis, chest tightness, shortness of breath and wheezing.   Cardiovascular: Negative for chest pain and palpitations.  Gastrointestinal: Negative for abdominal pain, heartburn, nausea and vomiting.  Musculoskeletal: Negative for myalgias.  Skin: Negative for rash.  Allergic/Immunologic: Negative for environmental allergies.  Neurological: Negative for headaches.    Social History   Tobacco Use  . Smoking status: Never Smoker  . Smokeless tobacco: Never Used  Substance Use Topics  . Alcohol use: No    Alcohol/week: 0.0 oz   Objective:   BP 104/60 (BP Location: Right Arm, Patient Position: Sitting, Cuff Size: Large)   Pulse 72   Temp 98.3 F (36.8 C) (Oral)   Resp 16   Wt 167 lb (75.8 kg)   SpO2 95%   BMI 25.39 kg/m  Vitals:   12/22/17 1023  BP: 104/60  Pulse: 72  Resp: 16  Temp: 98.3 F (36.8 C)  TempSrc: Oral  SpO2: 95%  Weight: 167 lb (75.8 kg)     Physical Exam  General Appearance:    Alert, cooperative, no distress  HENT:   bilateral TM normal without fluid or infection, neck without nodes, throat normal without erythema or exudate, frontal sinuses tender and nasal mucosa congested  Eyes:    PERRL, conjunctiva/corneas clear, EOM's intact       Lungs:     Clear to auscultation bilaterally, respirations unlabored  Heart:    Regular rate and rhythm  Neurologic:   Awake, alert, oriented x 3. No  apparent focal neurological           defect.           Assessment & Plan:     1. Acute sinusitis, recurrence not specified, unspecified location  - azithromycin (ZITHROMAX) 250 MG tablet; 2 by mouth today, then 1 daily for 4 days  Dispense: 6 tablet; Refill: 0  2. Bronchitis  - azithromycin (ZITHROMAX) 250 MG tablet; 2 by mouth today, then 1 daily for 4 days  Dispense: 6 tablet; Refill: 0   Call if symptoms change or if not rapidly improving.          Lelon Huh, MD  [facility]

## 2018-01-22 ENCOUNTER — Other Ambulatory Visit: Payer: Self-pay | Admitting: Family Medicine

## 2018-02-24 ENCOUNTER — Telehealth: Payer: Self-pay

## 2018-02-24 ENCOUNTER — Encounter: Payer: Self-pay | Admitting: Psychiatry

## 2018-02-24 ENCOUNTER — Other Ambulatory Visit: Payer: Self-pay

## 2018-02-24 ENCOUNTER — Ambulatory Visit (INDEPENDENT_AMBULATORY_CARE_PROVIDER_SITE_OTHER): Payer: BLUE CROSS/BLUE SHIELD | Admitting: Psychiatry

## 2018-02-24 VITALS — BP 146/81 | HR 80 | Temp 98.6°F | Wt 168.4 lb

## 2018-02-24 DIAGNOSIS — F902 Attention-deficit hyperactivity disorder, combined type: Secondary | ICD-10-CM

## 2018-02-24 DIAGNOSIS — F411 Generalized anxiety disorder: Secondary | ICD-10-CM | POA: Diagnosis not present

## 2018-02-24 MED ORDER — AMPHETAMINE-DEXTROAMPHETAMINE 30 MG PO TABS: 30.0000 mg | ORAL_TABLET | Freq: Every day | ORAL | 0 refills | Status: DC

## 2018-02-24 MED ORDER — ALPRAZOLAM 1 MG PO TABS: 1.0000 mg | ORAL_TABLET | Freq: Two times a day (BID) | ORAL | 5 refills | Status: DC | PRN

## 2018-02-24 MED ORDER — AMPHETAMINE-DEXTROAMPHETAMINE 30 MG PO TABS: 30.0000 mg | ORAL_TABLET | Freq: Two times a day (BID) | ORAL | 0 refills | Status: DC

## 2018-02-24 NOTE — Telephone Encounter (Signed)
pt called states that he when by pharmacy and they dont have his medication.  pt was wanting to pick up today

## 2018-02-24 NOTE — Progress Notes (Signed)
Patient seen for follow-up.  No new complaints.  Mood is good.  Anxiety under control.  Sleeps well.  Appetite fine.  Patient's affect and behavior are calm and appropriate.  Thoughts appear to be lucid.  No sign of disorganized thinking.  No new physical complaints.  Supportive counseling and review of medicine use.  Continue current Adderall and Xanax.  Follow-up in 3 months

## 2018-03-20 NOTE — Progress Notes (Signed)
Patient: Joel Burgess Male    DOB: August 17, 1976   42 y.o.   MRN: [id number] Visit Date: 03/23/2018  Today's Provider: Lelon Huh, MD   Chief Complaint  Patient presents with  . Headache   Subjective:    Patient states he has had a pain like headache in the back left side of his neck for 1 month. Patient states the pain t stays in one area at base of skull/neck area. Pain is describe as sharp and stabbing. Patient states the the pain is intermittent but when it occurs, it will last all day. Patient has been taking otc tylenol and ibuprofen with no relief.   Headache   This is a new problem. The current episode started 1 to 4 weeks ago (1 month). The problem occurs intermittently. The pain does not radiate. The pain quality is not similar to prior headaches. The quality of the pain is described as sharp and stabbing. The pain is at a severity of 8/10. The pain is moderate. Associated symptoms include neck pain and scalp tenderness. Pertinent negatives include no abdominal pain, abnormal behavior, anorexia, back pain, blurred vision, coughing, dizziness, drainage, ear pain, eye pain, eye redness, eye watering, facial sweating, fever, hearing loss, insomnia, loss of balance, muscle aches, nausea, numbness, phonophobia, photophobia, rhinorrhea, seizures, sinus pressure, sore throat, swollen glands, tingling, tinnitus, visual change, vomiting, weakness or weight loss. Nothing aggravates the symptoms. He has tried acetaminophen and NSAIDs for the symptoms. The treatment provided no relief.   States pain has been there every day just behind his left ear. No relief with Tylenol or ibuprofen. States pain is present although mild when he awakens in the morning, and waxes and wanes throughout the day.  No photophobia or acoustaphobia.     Allergies  Allergen Reactions  . Penicillins Anaphylaxis    Has patient had a PCN reaction causing immediate rash, facial/tongue/throat swelling, SOB or  lightheadedness with hypotension: Yes Has patient had a PCN reaction causing severe rash involving mucus membranes or skin necrosis: No Has patient had a PCN reaction that required hospitalization pt not sure Has patient had a PCN reaction occurring within the last 10 years: No If all of the above answers are "NO", then may proceed with Cephalosporin use.     Current Outpatient Medications:  .  albuterol (PROAIR HFA) 108 (90 Base) MCG/ACT inhaler, INHALE TWO PUFFS INTO THE LUNGS EVERY 6 HOURS AS NEEDED FOR WHEEZING, Disp: 8.5 g, Rfl: 5 .  ALPRAZolam (XANAX) 1 MG tablet, Take 1 tablet (1 mg total) by mouth 2 (two) times daily as needed., Disp: 60 tablet, Rfl: 5 .  diazepam (VALIUM) 10 MG tablet, TAKE 1 TABLET BY MOUTH DAILY AS NEEDED, Disp: 30 tablet, Rfl: 5 .  ketoconazole (NIZORAL) 2 % cream, Apply 1 application topically 2 (two) times daily., Disp: 15 g, Rfl: 0 .  amphetamine-dextroamphetamine (ADDERALL) 30 MG tablet, Take 1 tablet by mouth 2 (two) times daily. , Disp: 60 tablet, Rfl: 0   Review of Systems  Constitutional: Negative for appetite change, fever and weight loss.  HENT: Negative for ear pain, hearing loss, rhinorrhea, sinus pressure, sore throat and tinnitus.   Eyes: Negative for blurred vision, photophobia, pain and redness.  Respiratory: Negative for cough, chest tightness and wheezing.   Cardiovascular: Negative for chest pain and palpitations.  Gastrointestinal: Negative for abdominal pain, anorexia, nausea and vomiting.  Musculoskeletal: Positive for neck pain. Negative for back pain.  Neurological: Positive for headaches.  Negative for dizziness, tingling, seizures, weakness, numbness and loss of balance.  Psychiatric/Behavioral: The patient does not have insomnia.     Social History   Tobacco Use  . Smoking status: Never Smoker  . Smokeless tobacco: Never Used  Substance Use Topics  . Alcohol use: No    Alcohol/week: 0.0 oz   Objective:   BP 110/70 (BP  Location: Right Arm, Patient Position: Sitting, Cuff Size: Large)   Pulse 76   Temp 98.2 F (36.8 C) (Oral)   Resp 16   Wt 164 lb (74.4 kg)   SpO2 99%   BMI 24.94 kg/m  Vitals:   03/23/18 0820  BP: 110/70  Pulse: 76  Resp: 16  Temp: 98.2 F (36.8 C)  TempSrc: Oral  SpO2: 99%  Weight: 164 lb (74.4 kg)     Physical Exam  General appearance: alert, well developed, well nourished, cooperative and in no distress Head: Normocephalic, without obvious abnormality, atraumatic Respiratory: Respirations even and unlabored, normal respiratory rate MS: FROM neck and spine. No gross deformities.Ear canals clear with normal TM biilaterally. Slight tenderness at origin of left SCM.      Assessment & Plan:     1. Neck pain Likely SCM strain, start- predniSONE (DELTASONE) 10 MG tablet; 6 tablets for 1 day, then 5 for 1 day, then 4 for 1 day, then 3 for 1 day, then 2 for 1 day then 1 for 1 day.  Dispense: 21 tablet; Refill: 0  Call if not greatly improved within a few days        Lelon Huh, MD  [place]

## 2018-03-23 ENCOUNTER — Encounter: Payer: Self-pay | Admitting: Family Medicine

## 2018-03-23 ENCOUNTER — Ambulatory Visit: Payer: BLUE CROSS/BLUE SHIELD | Admitting: Family Medicine

## 2018-03-23 ENCOUNTER — Other Ambulatory Visit: Payer: Self-pay | Admitting: Psychiatry

## 2018-03-23 VITALS — BP 110/70 | HR 76 | Temp 98.2°F | Resp 16 | Wt 164.0 lb

## 2018-03-23 DIAGNOSIS — M542 Cervicalgia: Secondary | ICD-10-CM | POA: Diagnosis not present

## 2018-03-23 MED ORDER — PREDNISONE 10 MG PO TABS
ORAL_TABLET | ORAL | 0 refills | Status: AC
Start: 2018-03-23 — End: 2018-03-29

## 2018-04-08 ENCOUNTER — Encounter: Payer: Self-pay | Admitting: Family Medicine

## 2018-04-08 ENCOUNTER — Ambulatory Visit: Payer: BLUE CROSS/BLUE SHIELD | Admitting: Family Medicine

## 2018-04-08 DIAGNOSIS — R221 Localized swelling, mass and lump, neck: Secondary | ICD-10-CM | POA: Diagnosis not present

## 2018-04-08 MED ORDER — PREDNISONE 10 MG PO TABS: ORAL_TABLET | ORAL | 0 refills | Status: DC

## 2018-04-08 NOTE — Progress Notes (Signed)
Patient: Joel Burgess Male    DOB: [birth year]   42 y.o.   MRN: [id number] Visit Date: 04/08/2018  Today's Provider: Lelon Huh, MD   Chief Complaint  Patient presents with  . Neck Pain   Subjective:    Neck Pain   This is a recurrent problem. The current episode started 1 to 4 weeks ago. The problem has been waxing and waning. Associated symptoms include headaches. Pertinent negatives include no chest pain or fever.  Patient was last seen in the office on 03/23/2018 for Neck pain and headache. Patient was treated with Prednisone taper. Patient reports good compliance with treatment, good tolerance and good symptom control. He states that after taking the prednisone, symptoms improved for three days, then returned. Patient states he left work yesterday due to worsening pain.      Allergies  Allergen Reactions  . Penicillins Anaphylaxis    Has patient had a PCN reaction causing immediate rash, facial/tongue/throat swelling, SOB or lightheadedness with hypotension: Yes Has patient had a PCN reaction causing severe rash involving mucus membranes or skin necrosis: No Has patient had a PCN reaction that required hospitalization pt not sure Has patient had a PCN reaction occurring within the last 10 years: No If all of the above answers are "NO", then may proceed with Cephalosporin use.     Current Outpatient Medications:  .  albuterol (PROAIR HFA) 108 (90 Base) MCG/ACT inhaler, INHALE TWO PUFFS INTO THE LUNGS EVERY 6 HOURS AS NEEDED FOR WHEEZING, Disp: 8.5 g, Rfl: 5 .  ALPRAZolam (XANAX) 1 MG tablet, Take 1 tablet (1 mg total) by mouth 2 (two) times daily as needed., Disp: 60 tablet, Rfl: 5 .  amphetamine-dextroamphetamine (ADDERALL) 30 MG tablet, Take 1 tablet by mouth daily with breakfast., Disp: 60 tablet, Rfl: 0 .  amphetamine-dextroamphetamine (ADDERALL) 30 MG tablet, Take 1 tablet by mouth daily with breakfast., Disp: 60 tablet, Rfl: 0 .  amphetamine-dextroamphetamine  (ADDERALL) 30 MG tablet, TAKE 1 TABLET BY MOUTH TWICE DAILY, Disp: 60 tablet, Rfl: 0 .  diazepam (VALIUM) 10 MG tablet, TAKE 1 TABLET BY MOUTH DAILY AS NEEDED, Disp: 30 tablet, Rfl: 5 .  ketoconazole (NIZORAL) 2 % cream, Apply 1 application topically 2 (two) times daily., Disp: 15 g, Rfl: 0  Review of Systems  Constitutional: Negative for appetite change, chills and fever.  Respiratory: Negative for chest tightness, shortness of breath and wheezing.   Cardiovascular: Negative for chest pain and palpitations.  Gastrointestinal: Negative for abdominal pain, nausea and vomiting.  Musculoskeletal: Positive for neck pain.  Neurological: Positive for headaches.    Social History   Tobacco Use  . Smoking status: Never Smoker  . Smokeless tobacco: Never Used  Substance Use Topics  . Alcohol use: No    Alcohol/week: 0.0 oz   Objective:   BP 112/72 (BP Location: Left Arm, Patient Position: Sitting, Cuff Size: Large)   Pulse 92   Temp 99 F (37.2 C) (Oral)   Resp 16   Wt 166 lb (75.3 kg)   SpO2 95% Comment: room air  BMI 25.24 kg/m  There were no vitals filed for this visit.   Physical Exam  General Appearance:    Alert, cooperative, no distress  HENT:   bilateral TM normal without fluid or infection, throat normal without erythema or exudate and sinuses nontender Mildly tender vague grape size swelling inferior and posterior left ear more pronounced compared to visit of 03-23-2018  Eyes:    PERRL,  conjunctiva/corneas clear, EOM's intact       Lungs:     Clear to auscultation bilaterally, respirations unlabored  Heart:    Regular rate and rhythm  Neurologic:   Awake, alert, oriented x 3. No apparent focal neurological           defect.           Assessment & Plan:     1. Localized swelling, mass or lump of neck No sign of persistent infections, but enlarging mass.  - CT SOFT TISSUE NECK WO CONTRAST; Future       Lelon Huh, MD  [facility]

## 2018-04-16 ENCOUNTER — Other Ambulatory Visit: Payer: Self-pay | Admitting: Psychiatry

## 2018-04-17 ENCOUNTER — Telehealth: Payer: Self-pay | Admitting: Family Medicine

## 2018-04-17 DIAGNOSIS — R221 Localized swelling, mass and lump, neck: Secondary | ICD-10-CM

## 2018-04-17 NOTE — Telephone Encounter (Signed)
[place] with scheduling [phone]) states a new order needs to be put in.  States the order needs to be for a CT soft tissue neck with contrast.    Pt is scheduled for Monday.

## 2018-04-17 NOTE — Telephone Encounter (Signed)
Ok to order 

## 2018-04-17 NOTE — Telephone Encounter (Signed)
Order placed

## 2018-04-20 ENCOUNTER — Ambulatory Visit: Admission: RE | Admit: 2018-04-20 | Payer: BLUE CROSS/BLUE SHIELD | Source: Ambulatory Visit

## 2018-04-23 ENCOUNTER — Telehealth: Payer: Self-pay | Admitting: Family Medicine

## 2018-04-23 NOTE — Telephone Encounter (Signed)
Please advise patient that CT of neck was normal. Lump is probably just a calcified cyst. No further evaluation is needed.

## 2018-04-24 MED ORDER — DOXYCYCLINE HYCLATE 100 MG PO TABS: 100.0000 mg | ORAL_TABLET | Freq: Two times a day (BID) | ORAL | 0 refills | Status: DC

## 2018-04-24 NOTE — Telephone Encounter (Signed)
Patient was notified of results. Patient wants to know how he can get rid of the knot? He says he wants it gone because it still hurts when he touches it. Please advise?

## 2018-04-24 NOTE — Telephone Encounter (Signed)
Advised patient as below.  

## 2018-04-24 NOTE — Telephone Encounter (Signed)
Can try course of doxycycline for 10 days, have sent prescription to [facility]. If not gone when finished then will need to see ENT.

## 2018-05-23 ENCOUNTER — Other Ambulatory Visit: Payer: Self-pay | Admitting: Psychiatry

## 2018-05-26 ENCOUNTER — Encounter: Payer: Self-pay | Admitting: Psychiatry

## 2018-05-26 ENCOUNTER — Other Ambulatory Visit: Payer: Self-pay

## 2018-05-26 ENCOUNTER — Ambulatory Visit: Payer: Self-pay | Admitting: Psychiatry

## 2018-05-26 VITALS — HR 76 | Temp 98.6°F | Wt 165.0 lb

## 2018-05-26 DIAGNOSIS — F411 Generalized anxiety disorder: Secondary | ICD-10-CM

## 2018-05-26 DIAGNOSIS — F902 Attention-deficit hyperactivity disorder, combined type: Secondary | ICD-10-CM

## 2018-05-26 MED ORDER — AMPHETAMINE-DEXTROAMPHETAMINE 30 MG PO TABS
30.0000 mg | ORAL_TABLET | Freq: Every day | ORAL | 0 refills | Status: DC
Start: 2018-05-26 — End: 2018-08-27

## 2018-05-26 MED ORDER — AMPHETAMINE-DEXTROAMPHETAMINE 30 MG PO TABS: 1.0000 | ORAL_TABLET | Freq: Two times a day (BID) | ORAL | 0 refills | Status: DC

## 2018-05-26 MED ORDER — ALPRAZOLAM 1 MG PO TABS: 1.0000 mg | ORAL_TABLET | Freq: Two times a day (BID) | ORAL | 5 refills | Status: DC | PRN

## 2018-05-26 MED ORDER — AMPHETAMINE-DEXTROAMPHETAMINE 30 MG PO TABS: 30.0000 mg | ORAL_TABLET | Freq: Every day | ORAL | 0 refills | Status: DC

## 2018-05-26 NOTE — Progress Notes (Signed)
Follow-up 42 year old man with ADHD and anxiety.  Doing very well.  No complaints.  Got a new job and is functioning well.  Neatly dressed and groomed.  Good eye contact normal psychomotor activity.  Speech normal rate tone and volume.  No suicidal ideation no psychosis no physical complaints  Renew all prescriptions.  Unfortunately in the electronic E prescribing is not working so I will have to call him and ask him to come back and pick up his prescriptions.  No other change to treatment plan follow-up 3 months.

## 2018-05-28 ENCOUNTER — Telehealth: Payer: Self-pay

## 2018-05-28 NOTE — Telephone Encounter (Signed)
pt was seen and dr clapacs had wrote rx for xanax 1 mg #60 take 1 bid with 5 refills also adderall 30 mg # 60 take bid die fill after  07-22-18 another one for fill after  06-22-18 and one to fill for today.

## 2018-06-24 ENCOUNTER — Other Ambulatory Visit: Payer: Self-pay | Admitting: *Deleted

## 2018-06-24 MED ORDER — DIAZEPAM 10 MG PO TABS: 10.0000 mg | ORAL_TABLET | Freq: Every day | ORAL | 5 refills | Status: DC | PRN

## 2018-06-30 ENCOUNTER — Telehealth: Payer: Self-pay | Admitting: Family Medicine

## 2018-06-30 NOTE — Telephone Encounter (Signed)
Uhah with [facility] is requesting if pt should be taking both diazepam (VALIUM) 10 MG tablet & ALPRAZolam (XANAX) 1 MG tablet. Please advise. Thanks TNP

## 2018-06-30 NOTE — Telephone Encounter (Signed)
Please advise 

## 2018-07-01 NOTE — Telephone Encounter (Signed)
Uhah advised.

## 2018-07-01 NOTE — Telephone Encounter (Signed)
Yes, he takes the diazepam for muscle spasms, and the alprazolam prn for anxiety.

## 2018-07-23 ENCOUNTER — Ambulatory Visit: Payer: Self-pay | Admitting: Physician Assistant

## 2018-07-23 ENCOUNTER — Ambulatory Visit: Payer: Self-pay | Admitting: Family Medicine

## 2018-08-27 ENCOUNTER — Other Ambulatory Visit: Payer: Self-pay

## 2018-08-27 ENCOUNTER — Encounter: Payer: Self-pay | Admitting: Psychiatry

## 2018-08-27 ENCOUNTER — Ambulatory Visit: Payer: Self-pay | Admitting: Psychiatry

## 2018-08-27 VITALS — BP 134/81 | HR 77 | Temp 98.0°F | Wt 160.0 lb

## 2018-08-27 DIAGNOSIS — F902 Attention-deficit hyperactivity disorder, combined type: Secondary | ICD-10-CM

## 2018-08-27 DIAGNOSIS — F411 Generalized anxiety disorder: Secondary | ICD-10-CM

## 2018-08-27 MED ORDER — AMPHETAMINE-DEXTROAMPHETAMINE 30 MG PO TABS: 30.0000 mg | ORAL_TABLET | Freq: Every day | ORAL | 0 refills | Status: DC

## 2018-08-27 MED ORDER — AMPHETAMINE-DEXTROAMPHETAMINE 30 MG PO TABS: 1.0000 | ORAL_TABLET | Freq: Two times a day (BID) | ORAL | 0 refills | Status: DC

## 2018-08-27 NOTE — Progress Notes (Signed)
Follow-up patient with ADHD and chronic anxiety.  No new complaints about his mental state.  Mood is good.  No depression.  No major anxiety.  Tolerating the Adderall without any side effects.  Sleeping well eating well.  No suicidal ideation no sign of psychosis.  Neatly dressed and groomed good eye contact normal psychomotor activity.  Speech normal rate tone and volume.  Affect euthymic.  Thoughts lucid.  He does complain of some pain in both of his arms but I was unable to give him any better advice than he needs to see his primary care doctor about it.  Patient appears to be stable and can continue current medicines refill Adderall and Xanax with follow-up in 3 months

## 2018-08-31 ENCOUNTER — Telehealth: Payer: Self-pay

## 2018-08-31 ENCOUNTER — Other Ambulatory Visit: Payer: Self-pay | Admitting: Psychiatry

## 2018-08-31 MED ORDER — AMPHETAMINE-DEXTROAMPHETAMINE 30 MG PO TABS: 30.0000 mg | ORAL_TABLET | Freq: Every day | ORAL | 0 refills | Status: DC

## 2018-08-31 MED ORDER — ALPRAZOLAM 1 MG PO TABS: 1.0000 mg | ORAL_TABLET | Freq: Two times a day (BID) | ORAL | 5 refills | Status: DC | PRN

## 2018-08-31 MED ORDER — AMPHETAMINE-DEXTROAMPHETAMINE 30 MG PO TABS: 1.0000 | ORAL_TABLET | Freq: Two times a day (BID) | ORAL | 0 refills | Status: DC

## 2018-08-31 NOTE — Telephone Encounter (Signed)
Dr.Clapac is in office today .Please let me know if he is not. Thanks

## 2018-08-31 NOTE — Telephone Encounter (Signed)
pt stated that [facility] did not have the adderall .  pt asked if the rx for adderall [facility] [address] has the medication in stock but they need a new rx.

## 2018-08-31 NOTE — Telephone Encounter (Signed)
I ordered every single 1 of these on September 5, as it is documented in the chart, but I will order them again if need be.  Done.

## 2018-08-31 NOTE — Telephone Encounter (Signed)
Don

## 2018-09-02 ENCOUNTER — Other Ambulatory Visit: Payer: Self-pay | Admitting: Psychiatry

## 2018-09-02 ENCOUNTER — Telehealth: Payer: Self-pay

## 2018-09-02 MED ORDER — AMPHETAMINE-DEXTROAMPHETAMINE 20 MG PO TABS
30.0000 mg | ORAL_TABLET | Freq: Every day | ORAL | 0 refills | Status: DC
Start: 2018-09-02 — End: 2019-01-14

## 2018-09-02 MED ORDER — AMPHETAMINE-DEXTROAMPHETAMINE 20 MG PO TABS: 30.0000 mg | ORAL_TABLET | Freq: Every day | ORAL | 0 refills | Status: DC

## 2018-09-02 NOTE — Telephone Encounter (Signed)
called pharmacy they confirmed that the adderall 30mg  is on back order and that they did have adderall 20mg 's  they will need a new rx for the adderall 20mg  take 1 and 1/2 to make the 30mg 

## 2018-09-02 NOTE — Telephone Encounter (Signed)
pt called states that the adderall 30 mg is on back order

## 2018-09-07 ENCOUNTER — Other Ambulatory Visit: Payer: Self-pay | Admitting: Psychiatry

## 2018-09-09 ENCOUNTER — Telehealth: Payer: Self-pay

## 2018-09-09 NOTE — Telephone Encounter (Signed)
done

## 2018-09-09 NOTE — Telephone Encounter (Signed)
Medication problem - Patient's [facility] is requesting a call back to verify what dosages are correct for them to fill for patient's Adderral as they have orders from 08/31/18 and 09/02/18.Question if he will be taking 2 times a day.

## 2018-09-23 ENCOUNTER — Other Ambulatory Visit: Payer: Self-pay | Admitting: Family Medicine

## 2018-09-23 MED ORDER — ALBUTEROL SULFATE HFA 108 (90 BASE) MCG/ACT IN AERS: INHALATION_SPRAY | RESPIRATORY_TRACT | 5 refills | Status: DC

## 2018-09-23 NOTE — Telephone Encounter (Signed)
Pt needing the generic of albuterol (PROAIR HFA) 108 (90 Base) MCG/ACT inhaler - pt doesn't have insurance currently.  Please fill at: [facility].  Thanks, [employer]

## 2018-10-12 ENCOUNTER — Telehealth: Payer: Self-pay

## 2018-10-12 NOTE — Telephone Encounter (Signed)
Pt needs a new rx for ampheta/dextro combo 30mg  take 1 bid.     amphetamine-dextroamphetamine (ADDERALL) 30 MG tablet  Medication  Date: 08/31/2018 Department: [facility] Ordering/Authorizing: Clapacs, Madie Reno, MD  Order Providers   Prescribing Provider Encounter Provider  Clapacs, Madie Reno, MD Clapacs, Madie Reno, MD  Outpatient Medication Detail    Disp Refills Start End   amphetamine-dextroamphetamine (ADDERALL) 30 MG tablet 60 tablet 0 08/31/2018    Sig - Route: Take 1 tablet by mouth 2 (two) times daily. - Oral   Sent to pharmacy as: amphetamine-dextroamphetamine (ADDERALL) 30 MG tablet   Earliest Fill Date: 08/31/2018   E-Prescribing Status: Receipt confirmed by pharmacy (09/01/2018 5:30 AM EDT)

## 2018-10-13 NOTE — Telephone Encounter (Signed)
Patient called back 2times to check on this message.

## 2018-10-14 ENCOUNTER — Other Ambulatory Visit: Payer: Self-pay | Admitting: Psychiatry

## 2018-10-14 MED ORDER — AMPHETAMINE-DEXTROAMPHETAMINE 30 MG PO TABS: 30.0000 mg | ORAL_TABLET | Freq: Two times a day (BID) | ORAL | 0 refills | Status: DC

## 2018-10-14 MED ORDER — AMPHETAMINE-DEXTROAMPHETAMINE 30 MG PO TABS: 30.0000 mg | ORAL_TABLET | Freq: Every day | ORAL | 0 refills | Status: DC

## 2018-10-14 NOTE — Telephone Encounter (Signed)
Done

## 2018-10-15 ENCOUNTER — Telehealth: Payer: Self-pay

## 2018-10-15 NOTE — Telephone Encounter (Signed)
the pharmacy called states that the one rx that you sent over for pt is only for 1 tablet a day #60 but it is suppose to be take 2 daily #60 .  they need a new rx sent back

## 2018-10-19 NOTE — Telephone Encounter (Signed)
pt called staes that rx is still at pharmacy

## 2018-10-20 NOTE — Telephone Encounter (Signed)
Pt has called again to get status of the correct rx has been sent in.

## 2018-10-21 ENCOUNTER — Other Ambulatory Visit: Payer: Self-pay | Admitting: Psychiatry

## 2018-10-22 ENCOUNTER — Other Ambulatory Visit: Payer: Self-pay

## 2018-10-22 ENCOUNTER — Encounter: Payer: Self-pay | Admitting: Psychiatry

## 2018-10-22 ENCOUNTER — Ambulatory Visit (INDEPENDENT_AMBULATORY_CARE_PROVIDER_SITE_OTHER): Payer: Self-pay | Admitting: Psychiatry

## 2018-10-22 VITALS — BP 144/80 | HR 81 | Wt 157.8 lb

## 2018-10-22 DIAGNOSIS — F902 Attention-deficit hyperactivity disorder, combined type: Secondary | ICD-10-CM

## 2018-10-22 DIAGNOSIS — F411 Generalized anxiety disorder: Secondary | ICD-10-CM

## 2018-10-22 NOTE — Progress Notes (Signed)
Follow-up for this patient with ADHD and anxiety.  Patient has been off his medicine for several days because of ongoing confusion about his prescriptions.  I still do not understand what is wrong with that as I am very clear that I put in the correct prescriptions and can show it in the computer.  Nevertheless the pharmacy is not filling them.  I have written out paper prescriptions today which are just as clear as they can possibly be and given them to him for his 30 mg twice a day for the next 3 months.  He already has prescription in place for his benzodiazepine.  Vision is neatly dressed and groomed good eye contact normal psychomotor activity no sign of suicidality no sign of psychosis.  Refill medicine follow-up in 3 months

## 2018-11-26 ENCOUNTER — Ambulatory Visit: Payer: Self-pay | Admitting: Psychiatry

## 2018-12-08 ENCOUNTER — Other Ambulatory Visit: Payer: Self-pay | Admitting: Family Medicine

## 2018-12-08 ENCOUNTER — Other Ambulatory Visit: Payer: Self-pay | Admitting: Psychiatry

## 2018-12-29 ENCOUNTER — Other Ambulatory Visit: Payer: Self-pay

## 2018-12-29 ENCOUNTER — Encounter: Payer: Self-pay | Admitting: Psychiatry

## 2018-12-29 ENCOUNTER — Ambulatory Visit: Payer: Self-pay | Admitting: Psychiatry

## 2018-12-29 VITALS — BP 135/80 | HR 83 | Temp 98.8°F | Wt 157.0 lb

## 2018-12-29 DIAGNOSIS — F411 Generalized anxiety disorder: Secondary | ICD-10-CM

## 2018-12-29 DIAGNOSIS — F902 Attention-deficit hyperactivity disorder, combined type: Secondary | ICD-10-CM

## 2018-12-29 MED ORDER — AMPHETAMINE-DEXTROAMPHETAMINE 30 MG PO TABS: 30.0000 mg | ORAL_TABLET | Freq: Two times a day (BID) | ORAL | 0 refills | Status: DC

## 2018-12-29 MED ORDER — AMPHETAMINE-DEXTROAMPHETAMINE 30 MG PO TABS: 30.0000 mg | ORAL_TABLET | Freq: Every day | ORAL | 0 refills | Status: DC

## 2018-12-29 MED ORDER — ALPRAZOLAM 1 MG PO TABS: 1.0000 mg | ORAL_TABLET | Freq: Two times a day (BID) | ORAL | 2 refills | Status: DC

## 2018-12-29 NOTE — Progress Notes (Signed)
Follow-up for this patient with ADHD and anxiety.  He has no new complaints.  Attention and focus have been good.  Sleeping well at night.  Not feeling anxious or depressed or overwhelmed.  Getting along well with his family.  No sign of any medicine abuse.  Blood pressure is under better control.  Neatly dressed and groomed.  Good eye contact normal psychomotor activity speech normal rate tone and volume.  Affect euthymic thoughts lucid no sign of loosening of associations or delusions.  Review medication use and indications and side effects.  Refill prescriptions follow-up 3 months.

## 2019-01-12 ENCOUNTER — Ambulatory Visit (INDEPENDENT_AMBULATORY_CARE_PROVIDER_SITE_OTHER): Payer: Self-pay | Admitting: Physician Assistant

## 2019-01-12 ENCOUNTER — Encounter: Payer: Self-pay | Admitting: Physician Assistant

## 2019-01-12 VITALS — BP 125/79 | HR 85 | Temp 98.2°F | Resp 16 | Wt 156.0 lb

## 2019-01-12 DIAGNOSIS — M79672 Pain in left foot: Secondary | ICD-10-CM

## 2019-01-12 NOTE — Progress Notes (Signed)
Patient: Joel Burgess Male    DOB: 08/09/1976   43 y.o.   MRN: [id number] Visit Date: 01/13/2019  Today's Provider: Trinna Post, PA-C   Chief Complaint  Patient presents with  . Ankle Pain   Subjective:     HPI Patient here today c/o ankle pain after fall yesterday. Patient reports the other night he got up out of bed in the middle of the night to eat a girl scout cookie and tripped on his socks on the wood floor. He inverted his left foot.  Patient was seen at [facility] ER and was treated for ankle sprain on 01/11/2019. Patient had xrays of his left foot, ankle, and tibia/fibula. Results of Xrays as below:  FINDINGS/IMPRESSION:  Irregular lucency suspicious for nondisplaced fracture of the medial cortex of the navicular best visualized on AP foot radiograph. Alignment at the talonavicular and cuneonavicular joints remain anatomic without dislocation. No additional fractures identified. Mild soft tissue swelling.  Ankle mortise is congruent. Tibia and fibula intact. Alignment at the knee maintained. Joint spaces maintained.  Patient here today requesting a extended days off from work. Patient reports that he can not bend his foot. Patient reports he works a very physical job at [employer], has to [employer] frequently. He is using crutches today.   Allergies  Allergen Reactions  . Penicillins Anaphylaxis    Has patient had a PCN reaction causing immediate rash, facial/tongue/throat swelling, SOB or lightheadedness with hypotension: Yes Has patient had a PCN reaction causing severe rash involving mucus membranes or skin necrosis: No Has patient had a PCN reaction that required hospitalization pt not sure Has patient had a PCN reaction occurring within the last 10 years: No If all of the above answers are "NO", then may proceed with Cephalosporin use.     Current Outpatient Medications:  .  albuterol (PROAIR HFA) 108 (90 Base) MCG/ACT inhaler, INHALE TWO PUFFS INTO THE  LUNGS EVERY 6 HOURS AS NEEDED FOR WHEEZING, Disp: 8.5 g, Rfl: 5 .  ALPRAZolam (XANAX) 1 MG tablet, Take 1 tablet (1 mg total) by mouth 2 (two) times daily., Disp: 60 tablet, Rfl: 2 .  amphetamine-dextroamphetamine (ADDERALL) 30 MG tablet, Take 1 tablet by mouth 2 (two) times daily., Disp: 60 tablet, Rfl: 0 .  diazepam (VALIUM) 10 MG tablet, TAKE 1 TABLET BY MOUTH EVERY DAY AS NEEDED, Disp: 30 tablet, Rfl: 5 .  ketoconazole (NIZORAL) 2 % cream, Apply 1 application topically 2 (two) times daily., Disp: 15 g, Rfl: 0 .  amphetamine-dextroamphetamine (ADDERALL) 20 MG tablet, Take 1.5 tablets (30 mg total) by mouth daily with breakfast., Disp: 45 tablet, Rfl: 0 .  amphetamine-dextroamphetamine (ADDERALL) 20 MG tablet, Take 1.5 tablets (30 mg total) by mouth daily with breakfast., Disp: 45 tablet, Rfl: 0 .  amphetamine-dextroamphetamine (ADDERALL) 20 MG tablet, Take 1.5 tablets (30 mg total) by mouth daily with breakfast. Fill after October 27 2018, Disp: 45 tablet, Rfl: 0 .  amphetamine-dextroamphetamine (ADDERALL) 30 MG tablet, Take 1 tablet by mouth daily with breakfast., Disp: 60 tablet, Rfl: 0 .  amphetamine-dextroamphetamine (ADDERALL) 30 MG tablet, Take 1 tablet by mouth 2 (two) times daily with a meal., Disp: 60 tablet, Rfl: 0 .  amphetamine-dextroamphetamine (ADDERALL) 30 MG tablet, Take 1 tablet by mouth 2 (two) times daily., Disp: 60 tablet, Rfl: 0  Review of Systems  Constitutional: Negative.   Cardiovascular: Negative.   Musculoskeletal: Positive for myalgias.    Social History   Tobacco Use  .  Smoking status: Never Smoker  . Smokeless tobacco: Never Used  Substance Use Topics  . Alcohol use: No    Alcohol/week: 0.0 standard drinks      Objective:   BP 125/79 (BP Location: Left Arm, Patient Position: Sitting, Cuff Size: Normal)   Pulse 85   Temp 98.2 F (36.8 C) (Oral)   Resp 16   Wt 156 lb (70.8 kg)   BMI 23.72 kg/m  Vitals:   01/12/19 1049  BP: 125/79  Pulse: 85    Resp: 16  Temp: 98.2 F (36.8 C)  TempSrc: Oral  Weight: 156 lb (70.8 kg)     Physical Exam Constitutional:      Appearance: Normal appearance.  Musculoskeletal:     Left ankle: He exhibits decreased range of motion and swelling. He exhibits no ecchymosis. Tenderness.     Left foot: Decreased range of motion. Normal capillary refill. Tenderness and swelling present. No bony tenderness.     Comments: Patient ambulates using crutches. His left foot is in an ankle stirrup splint and hard soled shoe. Tender on the lateral aspect of his left foot. There is some swelling. Not tender over navicular bone.   Skin:    General: Skin is warm and dry.  Neurological:     Mental Status: He is alert and oriented to person, place, and time. Mental status is at baseline.  Psychiatric:        Mood and Affect: Mood normal.        Behavior: Behavior normal.         Assessment & Plan    1. Left foot pain  I have reviewed encounter and xrays from [facility] ER, where patient was treated for a left ankle sprain. Foot xray shows "irregular lucency" over the navicular concerning for nondisplaced fracture. I have advised him that I think he should be seen by an orthopedist regarding these findings and patient is agreeable. I have extended work note one week and have directed patient to [facility] walk in clinic on [address]. Patient expresses understanding and reports he will go to the walk in clinic today.   Return if symptoms worsen or fail to improve.  The entirety of the information documented in the History of Present Illness, Review of Systems and Physical Exam were personally obtained by me. Portions of this information were initially documented by Lynford Humphrey, CMA and reviewed by me for thoroughness and accuracy.        Trinna Post, PA-C  [facility]

## 2019-01-12 NOTE — Patient Instructions (Signed)
Please go to [facility] walk in clinic from 1:30 - 7:30 pm for left foot injury to evaluate for navicular injury    Ankle Sprain  An ankle sprain is a stretch or tear in one of the tough tissues (ligaments) that connect the bones in your ankle. An ankle sprain can happen when the ankle rolls outward (inversion sprain) or inward (eversion sprain). What are the causes? This condition is caused by rolling or twisting the ankle. What increases the risk? You are more likely to develop this condition if you play sports. What are the signs or symptoms? Symptoms of this condition include:  Pain in your ankle.  Swelling.  Bruising. This may happen right after you sprain your ankle or 1-2 days later.  Trouble standing or walking. How is this diagnosed? This condition is diagnosed with:  A physical exam. During the exam, your doctor will press on certain parts of your foot and ankle and try to move them in certain ways.  X-ray imaging. These may be taken to see how bad the sprain is and to check for broken bones. How is this treated? This condition may be treated with:  A brace or splint. This is used to keep the ankle from moving until it heals.  An elastic bandage. This is used to support the ankle.  Crutches.  Pain medicine.  Surgery. This may be needed if the sprain is very bad.  Physical therapy. This may help to improve movement in the ankle. Follow these instructions at home: If you have a brace or a splint:  Wear the brace or splint as told by your doctor. Remove it only as told by your doctor.  Loosen the brace or splint if your toes: ? Tingle. ? Lose feeling (become numb). ? Turn cold and blue.  Keep the brace or splint clean.  If the brace or splint is not waterproof: ? Do not let it get wet. ? Cover it with a watertight covering when you take a bath or a shower. If you have an elastic bandage (dressing):  Remove it to shower or bathe.  Try not to move  your ankle much, but wiggle your toes from time to time. This helps to prevent swelling.  Adjust the dressing if it feels too tight.  Loosen the dressing if your foot: ? Loses feeling. ? Tingles. ? Becomes cold and blue. Managing pain, stiffness, and swelling   Take over-the-counter and prescription medicines only as told by doctor.  For 2-3 days, keep your ankle raised (elevated) above the level of your heart.  If told, put ice on the injured area: ? If you have a removable brace or splint, remove it as told by your doctor. ? Put ice in a plastic bag. ? Place a towel between your skin and the bag. ? Leave the ice on for 20 minutes, 2-3 times a day. General instructions  Rest your ankle.  Do not use your injured leg to support your body weight until your doctor says that you can. Use crutches as told by your doctor.  Do not use any products that contain nicotine or tobacco, such as cigarettes, e-cigarettes, and chewing tobacco. If you need help quitting, ask your doctor.  Keep all follow-up visits as told by your doctor. Contact a doctor if:  Your bruises or swelling are quickly getting worse.  Your pain does not get better after you take medicine. Get help right away if:  You cannot feel your toes or  foot.  Your foot or toes look blue.  You have very bad pain that gets worse. Summary  An ankle sprain is a stretch or tear in one of the tough tissues (ligaments) that connect the bones in your ankle.  This condition is caused by rolling or twisting the ankle.  Symptoms include pain, swelling, bruising, and trouble walking.  To help with pain and swelling, put ice on the injured ankle, raise your ankle above the level of your heart, and use an elastic bandage. Also, rest as told by your doctor.  Keep all follow-up visits as told by your doctor. This is important. This information is not intended to replace advice given to you by your health care provider. Make sure  you discuss any questions you have with your health care provider. Document Released: 05/27/2008 Document Revised: 05/05/2018 Document Reviewed: 05/05/2018 Elsevier Interactive Patient Education  2019 [employer].

## 2019-01-14 ENCOUNTER — Other Ambulatory Visit: Payer: Self-pay | Admitting: Psychiatry

## 2019-01-14 ENCOUNTER — Telehealth: Payer: Self-pay

## 2019-01-14 MED ORDER — AMPHETAMINE-DEXTROAMPHETAMINE 30 MG PO TABS: 30.0000 mg | ORAL_TABLET | Freq: Two times a day (BID) | ORAL | 0 refills | Status: DC

## 2019-01-14 MED ORDER — AMPHETAMINE-DEXTROAMPHETAMINE 20 MG PO TABS: 20.0000 mg | ORAL_TABLET | Freq: Two times a day (BID) | ORAL | 0 refills | Status: DC

## 2019-01-14 NOTE — Telephone Encounter (Signed)
Pt is due for refill today but the rx states take 1 a day but patient has been taking 2 a day they need a correct rx sent to pharmacy today  amphetamine-dextroamphetamine (ADDERALL) 30 MG tablet  Medication  Date: 12/29/2018 Department: [place] Ordering/Authorizing: Clapacs, Madie Reno, MD  Order Providers   Prescribing Provider Encounter Provider  Clapacs, Madie Reno, MD Clapacs, Madie Reno, MD  Outpatient Medication Detail    Disp Refills Start End   amphetamine-dextroamphetamine (ADDERALL) 30 MG tablet 60 tablet 0 12/29/2018    Sig - Route: Take 1 tablet by mouth daily with breakfast. - Oral   Sent to pharmacy as: amphetamine-dextroamphetamine (ADDERALL) 30 MG tablet   Earliest Fill Date: 12/29/2018   E-Prescribing Status: Receipt confirmed by pharmacy (12/29/2018 4:24 PM EST)

## 2019-01-15 ENCOUNTER — Other Ambulatory Visit: Payer: Self-pay | Admitting: Psychiatry

## 2019-01-15 NOTE — Telephone Encounter (Signed)
Yes, it was.  You can see it in the computer.  It is documented in plain black and white.  The dose that I put in was 30 mg twice a day.  There is absolutely no misunderstanding about it.  I am getting increasingly sick of how every single time I prescribed for this patient that I have to redo it at least 2 or 3 times.  I absolutely did not write and I am not going to do it over again.  These are controlled substances.  The pharmacy needs to do it correctly.

## 2019-01-15 NOTE — Telephone Encounter (Signed)
pt called states that the rx that you sent in was not the correct dosage. can you please fax the correct dosages.

## 2019-03-29 ENCOUNTER — Other Ambulatory Visit: Payer: Self-pay | Admitting: Psychiatry

## 2019-03-29 MED ORDER — AMPHETAMINE-DEXTROAMPHETAMINE 30 MG PO TABS: 1.0000 | ORAL_TABLET | Freq: Two times a day (BID) | ORAL | 0 refills | Status: DC

## 2019-03-29 MED ORDER — AMPHETAMINE-DEXTROAMPHETAMINE 30 MG PO TABS: 30.0000 mg | ORAL_TABLET | Freq: Two times a day (BID) | ORAL | 0 refills | Status: DC

## 2019-03-29 MED ORDER — ALPRAZOLAM 1 MG PO TABS: 1.0000 mg | ORAL_TABLET | Freq: Two times a day (BID) | ORAL | 2 refills | Status: DC

## 2019-03-29 NOTE — Progress Notes (Signed)
I called the patient this evening because I would not be in the office tomorrow for our scheduled telephone appointment.  Contacted the patient and explained the situation.  Patient has no new complaints.  Reports that medications continue to be helpful.  Attention and focus are good.  Sleeps well at night.  Appetite unchanged.  Mood is been stable.  No psychotic symptoms.  Based on telephone conversation and his voice sounds calm and appropriate.  He is alert and oriented and attentive and makes reasonable decisions.  No sign of disorganized thinking.  Refill Xanax and Adderall prescriptions at the appropriate pharmacies.  He can call and make a follow-up appointment in 3 months.

## 2019-03-30 ENCOUNTER — Other Ambulatory Visit: Payer: Self-pay

## 2019-03-30 ENCOUNTER — Ambulatory Visit: Payer: Self-pay | Admitting: Psychiatry

## 2019-04-21 ENCOUNTER — Other Ambulatory Visit: Payer: Self-pay

## 2019-04-21 ENCOUNTER — Emergency Department (HOSPITAL_COMMUNITY): Payer: No Typology Code available for payment source

## 2019-04-21 ENCOUNTER — Emergency Department (HOSPITAL_COMMUNITY)
Admission: EM | Admit: 2019-04-21 | Discharge: 2019-04-21 | Disposition: A | Discharge status: Home / Self Care | Payer: No Typology Code available for payment source | Attending: Emergency Medicine | Admitting: Emergency Medicine

## 2019-04-21 ENCOUNTER — Encounter (HOSPITAL_COMMUNITY): Payer: Self-pay

## 2019-04-21 DIAGNOSIS — Y999 Unspecified external cause status: Secondary | ICD-10-CM | POA: Insufficient documentation

## 2019-04-21 DIAGNOSIS — M542 Cervicalgia: Secondary | ICD-10-CM | POA: Insufficient documentation

## 2019-04-21 DIAGNOSIS — Z79899 Other long term (current) drug therapy: Secondary | ICD-10-CM | POA: Insufficient documentation

## 2019-04-21 DIAGNOSIS — Y92410 Unspecified street and highway as the place of occurrence of the external cause: Secondary | ICD-10-CM | POA: Insufficient documentation

## 2019-04-21 DIAGNOSIS — Y9389 Activity, other specified: Secondary | ICD-10-CM | POA: Diagnosis not present

## 2019-04-21 DIAGNOSIS — S52542A Smith's fracture of left radius, initial encounter for closed fracture: Secondary | ICD-10-CM | POA: Insufficient documentation

## 2019-04-21 DIAGNOSIS — S59912A Unspecified injury of left forearm, initial encounter: Secondary | ICD-10-CM | POA: Diagnosis present

## 2019-04-21 LAB — CBC WITH DIFFERENTIAL/PLATELET
Abs Immature Granulocytes: 0.03 10*3/uL (ref 0.00–0.07)
Basophils Absolute: 0 10*3/uL (ref 0.0–0.1)
Basophils Relative: 0 %
Eosinophils Absolute: 0.1 10*3/uL (ref 0.0–0.5)
Eosinophils Relative: 2 %
HCT: 44.1 % (ref 39.0–52.0)
Hemoglobin: 14.9 g/dL (ref 13.0–17.0)
Immature Granulocytes: 0 %
Lymphocytes Relative: 23 %
Lymphs Abs: 1.9 10*3/uL (ref 0.7–4.0)
MCH: 30.3 pg (ref 26.0–34.0)
MCHC: 33.8 g/dL (ref 30.0–36.0)
MCV: 89.8 fL (ref 80.0–100.0)
Monocytes Absolute: 0.7 10*3/uL (ref 0.1–1.0)
Monocytes Relative: 8 %
Neutro Abs: 5.6 10*3/uL (ref 1.7–7.7)
Neutrophils Relative %: 67 %
Platelets: 266 10*3/uL (ref 150–400)
RBC: 4.91 MIL/uL (ref 4.22–5.81)
RDW: 13 % (ref 11.5–15.5)
WBC: 8.4 10*3/uL (ref 4.0–10.5)
nRBC: 0 % (ref 0.0–0.2)

## 2019-04-21 LAB — BASIC METABOLIC PANEL
Anion gap: 9 (ref 5–15)
BUN: 13 mg/dL (ref 6–20)
CO2: 24 mmol/L (ref 22–32)
Calcium: 9.5 mg/dL (ref 8.9–10.3)
Chloride: 105 mmol/L (ref 98–111)
Creatinine, Ser: 0.92 mg/dL (ref 0.61–1.24)
GFR calc Af Amer: >60 mL/min (ref >60)
GFR calc non Af Amer: >60 mL/min (ref >60)
Glucose, Bld: 111 mg/dL — ABNORMAL HIGH (ref 70–99)
Potassium: 4.1 mmol/L (ref 3.5–5.1)
Sodium: 138 mmol/L (ref 135–145)

## 2019-04-21 MED ORDER — MORPHINE SULFATE 15 MG PO TABS: 15.0000 mg | ORAL_TABLET | ORAL | 0 refills | Status: DC | PRN

## 2019-04-21 MED ORDER — IOHEXOL 350 MG/ML SOLN
75.0000 mL | Freq: Once | INTRAVENOUS | Status: AC | PRN
  Administered 2019-04-21: 20:00:00 75 mL via INTRAVENOUS

## 2019-04-21 MED ORDER — FENTANYL CITRATE (PF) 100 MCG/2ML IJ SOLN
100.0000 ug | Freq: Once | INTRAMUSCULAR | Status: AC
  Administered 2019-04-21: 100 ug via INTRAVENOUS
  Filled 2019-04-21: qty 2

## 2019-04-21 NOTE — Discharge Instructions (Addendum)
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
## 2019-04-21 NOTE — ED Notes (Signed)
Last PO intake was around 12 pm today.
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
## 2019-04-22 ENCOUNTER — Other Ambulatory Visit: Payer: Self-pay | Admitting: Orthopedic Surgery

## 2019-04-22 ENCOUNTER — Encounter (HOSPITAL_BASED_OUTPATIENT_CLINIC_OR_DEPARTMENT_OTHER): Payer: Self-pay | Admitting: *Deleted

## 2019-04-23 ENCOUNTER — Ambulatory Visit (HOSPITAL_BASED_OUTPATIENT_CLINIC_OR_DEPARTMENT_OTHER): Payer: No Typology Code available for payment source | Admitting: Anesthesiology

## 2019-04-23 ENCOUNTER — Encounter (HOSPITAL_BASED_OUTPATIENT_CLINIC_OR_DEPARTMENT_OTHER): Payer: Self-pay | Admitting: *Deleted

## 2019-04-23 ENCOUNTER — Other Ambulatory Visit: Payer: Self-pay

## 2019-04-23 ENCOUNTER — Encounter (HOSPITAL_BASED_OUTPATIENT_CLINIC_OR_DEPARTMENT_OTHER)
Admission: RE | Disposition: A | Discharge status: Home / Self Care | Payer: Self-pay | Source: Home / Self Care | Attending: Orthopedic Surgery

## 2019-04-23 ENCOUNTER — Other Ambulatory Visit: Payer: Self-pay | Admitting: Orthopedic Surgery

## 2019-04-23 ENCOUNTER — Ambulatory Visit (HOSPITAL_BASED_OUTPATIENT_CLINIC_OR_DEPARTMENT_OTHER)
Admission: RE | Admit: 2019-04-23 | Discharge: 2019-04-23 | Disposition: A | Discharge status: Home / Self Care | Payer: No Typology Code available for payment source | Attending: Orthopedic Surgery | Admitting: Orthopedic Surgery

## 2019-04-23 DIAGNOSIS — F909 Attention-deficit hyperactivity disorder, unspecified type: Secondary | ICD-10-CM | POA: Insufficient documentation

## 2019-04-23 DIAGNOSIS — S52572A Other intraarticular fracture of lower end of left radius, initial encounter for closed fracture: Secondary | ICD-10-CM | POA: Insufficient documentation

## 2019-04-23 DIAGNOSIS — F419 Anxiety disorder, unspecified: Secondary | ICD-10-CM | POA: Diagnosis not present

## 2019-04-23 DIAGNOSIS — Z79899 Other long term (current) drug therapy: Secondary | ICD-10-CM | POA: Insufficient documentation

## 2019-04-23 DIAGNOSIS — G5602 Carpal tunnel syndrome, left upper limb: Secondary | ICD-10-CM | POA: Diagnosis not present

## 2019-04-23 HISTORY — PX: CARPAL TUNNEL RELEASE: SHX101

## 2019-04-23 HISTORY — PX: OPEN REDUCTION INTERNAL FIXATION (ORIF) DISTAL RADIAL FRACTURE: SHX5989

## 2019-04-23 SURGERY — OPEN REDUCTION INTERNAL FIXATION (ORIF) DISTAL RADIUS FRACTURE
Anesthesia: Monitor Anesthesia Care | Site: Wrist | Laterality: Left

## 2019-04-23 MED ORDER — FENTANYL CITRATE (PF) 100 MCG/2ML IJ SOLN: 25.0000 ug | INTRAMUSCULAR | Status: DC | PRN

## 2019-04-23 MED ORDER — FENTANYL CITRATE (PF) 100 MCG/2ML IJ SOLN
INTRAMUSCULAR | Status: AC
  Filled 2019-04-23: qty 2

## 2019-04-23 MED ORDER — MIDAZOLAM HCL 2 MG/2ML IJ SOLN
1.0000 mg | INTRAMUSCULAR | Status: DC | PRN
  Administered 2019-04-23: 12:00:00 2 mg via INTRAVENOUS

## 2019-04-23 MED ORDER — CHLORHEXIDINE GLUCONATE 4 % EX LIQD: 60.0000 mL | Freq: Once | CUTANEOUS | Status: DC

## 2019-04-23 MED ORDER — ONDANSETRON HCL 4 MG/2ML IJ SOLN: 4.0000 mg | Freq: Once | INTRAMUSCULAR | Status: DC | PRN

## 2019-04-23 MED ORDER — PROPOFOL 10 MG/ML IV BOLUS
INTRAVENOUS | Status: AC
  Filled 2019-04-23: qty 20

## 2019-04-23 MED ORDER — MIDAZOLAM HCL 2 MG/2ML IJ SOLN
INTRAMUSCULAR | Status: AC
  Filled 2019-04-23: qty 2

## 2019-04-23 MED ORDER — ROPIVACAINE HCL 5 MG/ML IJ SOLN
INTRAMUSCULAR | Status: DC | PRN
  Administered 2019-04-23: 30 mL via PERINEURAL

## 2019-04-23 MED ORDER — VANCOMYCIN HCL IN DEXTROSE 1-5 GM/200ML-% IV SOLN
INTRAVENOUS | Status: AC
  Filled 2019-04-23: qty 200

## 2019-04-23 MED ORDER — 0.9 % SODIUM CHLORIDE (POUR BTL) OPTIME
TOPICAL | Status: DC | PRN
  Administered 2019-04-23: 1000 mL

## 2019-04-23 MED ORDER — CLONIDINE HCL (ANALGESIA) 100 MCG/ML EP SOLN
EPIDURAL | Status: DC | PRN
  Administered 2019-04-23: 50 ug

## 2019-04-23 MED ORDER — FENTANYL CITRATE (PF) 100 MCG/2ML IJ SOLN
50.0000 ug | INTRAMUSCULAR | Status: DC | PRN
  Administered 2019-04-23: 100 ug via INTRAVENOUS

## 2019-04-23 MED ORDER — PROPOFOL 500 MG/50ML IV EMUL
INTRAVENOUS | Status: DC | PRN
  Administered 2019-04-23: 100 ug/kg/min via INTRAVENOUS

## 2019-04-23 MED ORDER — VANCOMYCIN HCL IN DEXTROSE 1-5 GM/200ML-% IV SOLN
1000.0000 mg | INTRAVENOUS | Status: AC
  Administered 2019-04-23: 1000 mg via INTRAVENOUS

## 2019-04-23 MED ORDER — OXYCODONE HCL 5 MG/5ML PO SOLN: 5.0000 mg | Freq: Once | ORAL | Status: DC | PRN

## 2019-04-23 MED ORDER — LACTATED RINGERS IV SOLN
INTRAVENOUS | Status: DC
  Administered 2019-04-23: 12:00:00 via INTRAVENOUS

## 2019-04-23 MED ORDER — OXYCODONE-ACETAMINOPHEN 5-325 MG PO TABS: 1.0000 | ORAL_TABLET | ORAL | 0 refills | Status: DC | PRN

## 2019-04-23 MED ORDER — PROPOFOL 10 MG/ML IV BOLUS
INTRAVENOUS | Status: DC | PRN
  Administered 2019-04-23: 50 mg via INTRAVENOUS
  Administered 2019-04-23: 30 mg via INTRAVENOUS

## 2019-04-23 MED ORDER — LIDOCAINE 2% (20 MG/ML) 5 ML SYRINGE
INTRAMUSCULAR | Status: AC
  Filled 2019-04-23: qty 5

## 2019-04-23 MED ORDER — LIDOCAINE 2% (20 MG/ML) 5 ML SYRINGE
INTRAMUSCULAR | Status: DC | PRN
  Administered 2019-04-23: 50 mg via INTRAVENOUS

## 2019-04-23 MED ORDER — SCOPOLAMINE 1 MG/3DAYS TD PT72: 1.0000 | MEDICATED_PATCH | Freq: Once | TRANSDERMAL | Status: DC | PRN

## 2019-04-23 MED ORDER — OXYCODONE HCL 5 MG PO TABS: 5.0000 mg | ORAL_TABLET | Freq: Once | ORAL | Status: DC | PRN

## 2019-04-23 SURGICAL SUPPLY — 82 items
APL SKNCLS STERI-STRIP NONHPOA (GAUZE/BANDAGES/DRESSINGS) ×1
BANDAGE ACE 3X5.8 VEL STRL LF (GAUZE/BANDAGES/DRESSINGS) ×2 IMPLANT
BANDAGE ACE 4X5 VEL STRL LF (GAUZE/BANDAGES/DRESSINGS) ×2 IMPLANT
BENZOIN TINCTURE PRP APPL 2/3 (GAUZE/BANDAGES/DRESSINGS) ×2 IMPLANT
BIT DRILL 2 FAST STEP (BIT) ×2 IMPLANT
BIT DRILL 2.5X4 QC (BIT) ×2 IMPLANT
BLADE MINI RND TIP GREEN BEAV (BLADE) IMPLANT
BLADE SURG 15 STRL LF DISP TIS (BLADE) ×2 IMPLANT
BLADE SURG 15 STRL SS (BLADE) ×4
BNDG CMPR 9X4 STRL LF SNTH (GAUZE/BANDAGES/DRESSINGS)
BNDG ESMARK 4X9 LF (GAUZE/BANDAGES/DRESSINGS) IMPLANT
BNDG GAUZE ELAST 4 BULKY (GAUZE/BANDAGES/DRESSINGS) ×2 IMPLANT
CANISTER SUCT 1200ML W/VALVE (MISCELLANEOUS) IMPLANT
CORD BIPOLAR FORCEPS 12FT (ELECTRODE) ×2 IMPLANT
COVER BACK TABLE REUSABLE LG (DRAPES) ×2 IMPLANT
COVER WAND RF STERILE (DRAPES) IMPLANT
CUFF TOURN SGL QUICK 18X4 (TOURNIQUET CUFF) ×2 IMPLANT
DECANTER SPIKE VIAL GLASS SM (MISCELLANEOUS) IMPLANT
DRAPE EXTREMITY T 121X128X90 (DISPOSABLE) ×2 IMPLANT
DRAPE OEC MINIVIEW 54X84 (DRAPES) ×2 IMPLANT
DRAPE SURG 17X23 STRL (DRAPES) ×2 IMPLANT
DURAPREP 26ML APPLICATOR (WOUND CARE) ×2 IMPLANT
ELECT REM PT RETURN 9FT ADLT (ELECTROSURGICAL)
ELECTRODE REM PT RTRN 9FT ADLT (ELECTROSURGICAL) IMPLANT
GAUZE 4X4 16PLY RFD (DISPOSABLE) IMPLANT
GAUZE SPONGE 4X4 12PLY STRL (GAUZE/BANDAGES/DRESSINGS) ×2 IMPLANT
GAUZE XEROFORM 1X8 LF (GAUZE/BANDAGES/DRESSINGS) IMPLANT
GLOVE BIO SURGEON STRL SZ 6.5 (GLOVE) ×2 IMPLANT
GLOVE BIOGEL PI IND STRL 7.0 (GLOVE) ×1 IMPLANT
GLOVE BIOGEL PI IND STRL 7.5 (GLOVE) ×1 IMPLANT
GLOVE BIOGEL PI INDICATOR 7.0 (GLOVE) ×1
GLOVE BIOGEL PI INDICATOR 7.5 (GLOVE) ×1
GLOVE SURG SYN 8.0 (GLOVE) ×4 IMPLANT
GOWN STRL REIN XL XLG (GOWN DISPOSABLE) ×4 IMPLANT
GOWN STRL REUS W/ TWL LRG LVL3 (GOWN DISPOSABLE) ×1 IMPLANT
GOWN STRL REUS W/TWL LRG LVL3 (GOWN DISPOSABLE) ×2
NEEDLE HYPO 25X1 1.5 SAFETY (NEEDLE) ×2 IMPLANT
NS IRRIG 1000ML POUR BTL (IV SOLUTION) ×2 IMPLANT
PACK BASIN DAY SURGERY FS (CUSTOM PROCEDURE TRAY) ×2 IMPLANT
PAD CAST 3X4 CTTN HI CHSV (CAST SUPPLIES) ×1 IMPLANT
PAD CAST 4YDX4 CTTN HI CHSV (CAST SUPPLIES) IMPLANT
PADDING CAST ABS 4INX4YD NS (CAST SUPPLIES) ×1
PADDING CAST ABS COTTON 4X4 ST (CAST SUPPLIES) ×1 IMPLANT
PADDING CAST COTTON 3X4 STRL (CAST SUPPLIES) ×2
PADDING CAST COTTON 4X4 STRL (CAST SUPPLIES)
PEG SUBCHONDRAL SMOOTH 2.0X18 (Peg) ×2 IMPLANT
PEG SUBCHONDRAL SMOOTH 2.0X22 (Peg) ×6 IMPLANT
PEG SUBCHONDRAL SMOOTH 2.0X24 (Peg) ×4 IMPLANT
PEG SUBCHONDRAL SMOOTH 2.0X26 (Peg) ×2 IMPLANT
PENCIL BUTTON HOLSTER BLD 10FT (ELECTRODE) IMPLANT
PLATE STAN 24.4X59.5 LT (Plate) ×2 IMPLANT
SCREW BN 12X3.5XNS CORT TI (Screw) ×1 IMPLANT
SCREW CORT 3.5X10 LNG (Screw) ×2 IMPLANT
SCREW CORT 3.5X12 (Screw) ×2 IMPLANT
SCREW CORT 3.5X14 LNG (Screw) ×2 IMPLANT
SHEET MEDIUM DRAPE 40X70 STRL (DRAPES) ×2 IMPLANT
SLEEVE SCD COMPRESS KNEE MED (MISCELLANEOUS) ×2 IMPLANT
SLING ARM IMMOBILIZER LRG (SOFTGOODS) ×2 IMPLANT
SPLINT PLASTER CAST XFAST 3X15 (CAST SUPPLIES) IMPLANT
SPLINT PLASTER CAST XFAST 4X15 (CAST SUPPLIES) ×20 IMPLANT
SPLINT PLASTER XTRA FAST SET 4 (CAST SUPPLIES) ×20
SPLINT PLASTER XTRA FASTSET 3X (CAST SUPPLIES)
STOCKINETTE 4X48 STRL (DRAPES) ×2 IMPLANT
STRIP CLOSURE SKIN 1/2X4 (GAUZE/BANDAGES/DRESSINGS) ×2 IMPLANT
SUCTION FRAZIER HANDLE 10FR (MISCELLANEOUS)
SUCTION TUBE FRAZIER 10FR DISP (MISCELLANEOUS) IMPLANT
SUT CHROMIC 3 0 PS 2 (SUTURE) IMPLANT
SUT ETHILON 4 0 PS 2 18 (SUTURE) IMPLANT
SUT MERSILENE 4 0 P 3 (SUTURE) IMPLANT
SUT PROLENE 3 0 PS 2 (SUTURE) ×4 IMPLANT
SUT VIC AB 0 SH 27 (SUTURE) IMPLANT
SUT VIC AB 2-0 SH 27 (SUTURE) ×2
SUT VIC AB 2-0 SH 27XBRD (SUTURE) ×1 IMPLANT
SUT VIC AB 3-0 FS2 27 (SUTURE) IMPLANT
SUT VIC AB 4-0 RB1 18 (SUTURE) IMPLANT
SUT VICRYL RAPIDE 4-0 (SUTURE) IMPLANT
SUT VICRYL RAPIDE 4/0 PS 2 (SUTURE) IMPLANT
SYR 10ML LL (SYRINGE) ×2 IMPLANT
SYR BULB 3OZ (MISCELLANEOUS) ×2 IMPLANT
TOWEL GREEN STERILE FF (TOWEL DISPOSABLE) ×2 IMPLANT
TUBE CONNECTING 20X1/4 (TUBING) IMPLANT
UNDERPAD 30X30 (UNDERPADS AND DIAPERS) ×2 IMPLANT

## 2019-04-23 NOTE — Op Note (Signed)
Please see dictated report [phone]

## 2019-04-23 NOTE — Op Note (Signed)
Joel Burgess, Joel Burgess MEDICAL RECORD RC:[id number] ACCOUNT [id number] DATE OF BIRTH:05/01/1976 FACILITY: [facility] LOCATION: [facility] PHYSICIAN:Gladine Plude A. Burney Gauze, MD  OPERATIVE REPORT  DATE OF PROCEDURE:  04/23/2019  PREOPERATIVE DIAGNOSIS:  Left displaced intra-articular distal radius fracture volarly displaced with left acute carpal tunnel syndrome.  POSTOPERATIVE DIAGNOSIS:  Left displaced intraarticular distal radius fracture volarly displaced with left acute carpal tunnel syndrome.  PROCEDURES:  Open reduction and internal fixation of above with DVR plate and screws in the left median nerve decompression at the wrist.  SURGEON:  Charlotte Crumb, MD  ASSISTANT:  Dasnoit PA.  ANESTHESIA:  Regional.  COMPLICATIONS:  No complications.  DRAINS:  No drains.  DESCRIPTION OF PROCEDURE:  The patient was taken to the operating suite after induction of adequate regional anesthetic and removed.  Left upper extremity was prepped and draped in the usual sterile fashion.  An Esmarch was used to exsanguinate the limb  and the tourniquet was inflated to 250 mmHg.  At this point in time, incision made on the volar aspect of forearm and wrist and the skin was incised over the flexor carpi radialis sheath.  This was incised and the FCR was retracted to the midline, the  radial artery to the lateral side.   The fascia was incised and dissection was carried down to the level of the pronator quadratus.  The pronator quadratus was subperiosteally stripped off the distal radius revealing a volarly displaced intra-articular  fracture of the distal radius.  We released the brachioradialis off the radial styloid fragment and reduction was performed with extension over a rolled up towel.  We then placed a standard DVR plate on the volar aspect of the distal radius under direct  and fluoroscopic guidance, positioned it through the slotted hole.  Adequate plate position was established.  This was followed by 2  more cortical screws proximally followed by the 7 smooth pegs distally.  Fluoroscopic imaging revealed adequate reduction  in all 3 views.  The wound was irrigated and loosely closed.  After the median nerve was carefully identified and protected proximally, the transverse tear of the transverse carpal ligament proximally was identified and we regard to the median nerve  with a Soil scientist.  We then released the transverse carpal ligament from proximal to distal.  The nerve was intact.  The wound was irrigated and loosely closed in layers of 2-0 undyed Vicryl and a 3-0 Prolene subcuticular stitch on the skin.   Steri-Strips, 4 x 4 fluffs, and a volar splint was applied.    The patient tolerated this procedure well and went to recovery room in stable fashion.  AN/NUANCE  D:04/23/2019 T:04/23/2019 JOB:[id number]

## 2019-04-23 NOTE — Discharge Instructions (Signed)

## 2019-04-23 NOTE — Transfer of Care (Signed)
Immediate Anesthesia Transfer of Care Note  Patient: Joel Burgess  Procedure(s) Performed: Procedure(s) (LRB): OPEN REDUCTION INTERNAL FIXATION (ORIF) DISTAL RADIAL FRACTURE (Left) LEFT CARPAL TUNNEL RELEASE (Left)  Patient Location: PACU  Anesthesia Type: MAC  Level of Consciousness: awake, alert , oriented and patient cooperative  Airway & Oxygen Therapy: Patient Spontanous Breathing and Patient connected to face mask oxygen  Post-op Assessment: Report given to PACU RN and Post -op Vital signs reviewed and stable  Post vital signs: Reviewed and stable  Complications: No apparent anesthesia complications Last Vitals:  Vitals Value Taken Time  BP 89/53 04/23/2019  2:45 PM  Temp    Pulse 42 04/23/2019  2:47 PM  Resp 14 04/23/2019  2:47 PM  SpO2 100 % 04/23/2019  2:47 PM  Vitals shown include unvalidated device data.  Last Pain:  Vitals:   04/23/19 1132  TempSrc: Oral  PainSc: 9       Patients Stated Pain Goal: 5 (04/23/19 1132)
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
## 2019-04-23 NOTE — Anesthesia Procedure Notes (Signed)
Anesthesia Regional Block: Supraclavicular block   Pre-Anesthetic Checklist: ,, timeout performed, Correct Patient, Correct Site, Correct Laterality, Correct Procedure, Correct Position, site marked, Risks and benefits discussed,  Surgical consent,  Pre-op evaluation,  At surgeon's request and post-op pain management  Laterality: Left  Prep: chloraprep       Needles:  Injection technique: Single-shot  Needle Type: Echogenic Stimulator Needle     Needle Length: 9cm  Needle Gauge: 21     Additional Needles:   Procedures:,,,, ultrasound used (permanent image in chart),,,,  Narrative:  Start time: 04/23/2019 12:33 PM End time: 04/23/2019 12:37 PM Injection made incrementally with aspirations every 5 mL.  Performed by: Personally  Anesthesiologist: Lidia Collum, MD  Additional Notes: Monitors applied. Injection made in 5cc increments. No resistance to injection. Good needle visualization. Patient tolerated procedure well.

## 2019-04-23 NOTE — Anesthesia Preprocedure Evaluation (Addendum)
Anesthesia Evaluation  Patient identified by MRN, date of birth, ID band Patient awake    Reviewed: Allergy & Precautions, NPO status , Patient's Chart, lab work & pertinent test results  History of Anesthesia Complications Negative for: history of anesthetic complications  Airway Mallampati: II  TM Distance: >3 FB Neck ROM: Full    Dental no notable dental hx. (+) Teeth Intact   Pulmonary neg pulmonary ROS,    Pulmonary exam normal        Cardiovascular negative cardio ROS Normal cardiovascular exam     Neuro/Psych PSYCHIATRIC DISORDERS Anxiety negative neurological ROS     GI/Hepatic negative GI ROS, Neg liver ROS,   Endo/Other  negative endocrine ROS  Renal/GU negative Renal ROS  negative genitourinary   Musculoskeletal negative musculoskeletal ROS (+)   Abdominal   Peds  Hematology negative hematology ROS (+)   Anesthesia Other Findings   Reproductive/Obstetrics                            Anesthesia Physical Anesthesia Plan  ASA: I  Anesthesia Plan: MAC and Regional   Post-op Pain Management:  Regional for Post-op pain   Induction:   PONV Risk Score and Plan: 1 and Propofol infusion, Midazolam and Treatment may vary due to age or medical condition  Airway Management Planned: Natural Airway and Simple Face Mask  Additional Equipment: None  Intra-op Plan:   Post-operative Plan: Extubation in OR  Informed Consent: I have reviewed the patients History and Physical, chart, labs and discussed the procedure including the risks, benefits and alternatives for the proposed anesthesia with the patient or authorized representative who has indicated his/her understanding and acceptance.       Plan Discussed with:   Anesthesia Plan Comments:        Anesthesia Quick Evaluation

## 2019-04-23 NOTE — Progress Notes (Signed)
Assisted Dr. Witman with left, ultrasound guided, supraclavicular block. Side rails up, monitors on throughout procedure. See vital signs in flow sheet. Tolerated Procedure well. °

## 2019-04-23 NOTE — Anesthesia Postprocedure Evaluation (Signed)
Anesthesia Post Note  Patient: Deiontae Rabel  Procedure(s) Performed: OPEN REDUCTION INTERNAL FIXATION (ORIF) DISTAL RADIAL FRACTURE (Left Wrist) LEFT CARPAL TUNNEL RELEASE (Left Wrist)     Patient location during evaluation: PACU Anesthesia Type: Regional Level of consciousness: awake and alert Pain management: pain level controlled Vital Signs Assessment: post-procedure vital signs reviewed and stable Respiratory status: spontaneous breathing, nonlabored ventilation and respiratory function stable Cardiovascular status: blood pressure returned to baseline and stable Postop Assessment: no apparent nausea or vomiting Anesthetic complications: no    Last Vitals:  Vitals:   04/23/19 1500 04/23/19 1518  BP: 94/62 126/64  Pulse: (!) 55 (!) 50  Resp: 11 16  Temp:  36.5 C  SpO2: 100% 99%    Last Pain:  Vitals:   04/23/19 1500  TempSrc:   PainSc: 0-No pain                 Lidia Collum

## 2019-04-26 ENCOUNTER — Encounter (HOSPITAL_BASED_OUTPATIENT_CLINIC_OR_DEPARTMENT_OTHER): Payer: Self-pay | Admitting: Orthopedic Surgery

## 2019-06-04 ENCOUNTER — Other Ambulatory Visit: Payer: Self-pay | Admitting: Family Medicine

## 2019-06-10 ENCOUNTER — Ambulatory Visit (INDEPENDENT_AMBULATORY_CARE_PROVIDER_SITE_OTHER): Payer: Self-pay | Admitting: Psychiatry

## 2019-06-10 ENCOUNTER — Encounter: Payer: Self-pay | Admitting: Psychiatry

## 2019-06-10 ENCOUNTER — Other Ambulatory Visit: Payer: Self-pay

## 2019-06-10 DIAGNOSIS — F902 Attention-deficit hyperactivity disorder, combined type: Secondary | ICD-10-CM

## 2019-06-10 DIAGNOSIS — F909 Attention-deficit hyperactivity disorder, unspecified type: Secondary | ICD-10-CM | POA: Insufficient documentation

## 2019-06-10 MED ORDER — AMPHETAMINE-DEXTROAMPHETAMINE 30 MG PO TABS: 30.0000 mg | ORAL_TABLET | Freq: Two times a day (BID) | ORAL | 0 refills | Status: DC

## 2019-06-10 MED ORDER — DIAZEPAM 10 MG PO TABS: 10.0000 mg | ORAL_TABLET | Freq: Every evening | ORAL | 3 refills | Status: DC | PRN
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
## 2019-07-14 ENCOUNTER — Encounter: Payer: Self-pay | Admitting: Physician Assistant

## 2019-07-14 ENCOUNTER — Other Ambulatory Visit: Payer: Self-pay

## 2019-07-14 ENCOUNTER — Ambulatory Visit (INDEPENDENT_AMBULATORY_CARE_PROVIDER_SITE_OTHER): Payer: Self-pay | Admitting: Physician Assistant

## 2019-07-14 ENCOUNTER — Telehealth: Payer: Self-pay | Admitting: Family Medicine

## 2019-07-14 DIAGNOSIS — J069 Acute upper respiratory infection, unspecified: Secondary | ICD-10-CM

## 2019-07-14 NOTE — Patient Instructions (Signed)
This information is directly available on the CDC website: https://www.cdc.gov/coronavirus/2019-ncov/if-you-are-sick/steps-when-sick.html    Source:CDC Reference to specific commercial products, manufacturers, companies, or trademarks does not constitute its endorsement or recommendation by the U.S. Government, Department of Health and Human Services, or Centers for Disease Control and Prevention.  

## 2019-07-14 NOTE — Progress Notes (Addendum)
Patient: Joel Burgess Male    DOB: April 09, 1976   43 y.o.   MRN: [id number] Visit Date: 07/14/2019  Today's Provider: Trinna Post, PA-C   Chief Complaint  Patient presents with  . Sore Throat   Subjective:    Virtual Visit via Telephone Note  I connected with Burna Sis on 07/14/19 at 11:00 AM EDT by telephone and verified that I am speaking with the correct person using two identifiers.   I discussed the limitations, risks, security and privacy concerns of performing an evaluation and management service by telephone and the availability of in person appointments. I also discussed with the patient that there may be a patient responsible charge related to this service. The patient expressed understanding and agreed to proceed.  Patient location: home Provider location: [facility] office  Persons involved in the visit: patient, provider    Sore Throat  This is a new problem. Episode onset: 4 days ago. The problem has been gradually improving. Neither side of throat is experiencing more pain than the other. Maximum temperature: up to 100.7. Associated symptoms include congestion and coughing (productive with clear colored mucus). Pertinent negatives include no abdominal pain, diarrhea, ear pain, headaches, hoarse voice, shortness of breath, trouble swallowing or vomiting. He has had no exposure to strep or mono. He has tried gargles (also mucinex) for the symptoms. The treatment provided mild relief.  Patient states he went to [facility] yesterday to get tested for [facility]-19. He is still  awaiting results.   Allergies  Allergen Reactions  . Penicillins Anaphylaxis    Has patient had a PCN reaction causing immediate rash, facial/tongue/throat swelling, SOB or lightheadedness with hypotension: Yes Has patient had a PCN reaction causing severe rash involving mucus membranes or skin necrosis: No Has patient had a PCN reaction that required hospitalization pt not  sure Has patient had a PCN reaction occurring within the last 10 years: No If all of the above answers are "NO", then may proceed with Cephalosporin use.     Current Outpatient Medications:  .  ALPRAZolam (XANAX) 1 MG tablet, Take 1 tablet (1 mg total) by mouth 2 (two) times daily., Disp: 60 tablet, Rfl: 2 .  amphetamine-dextroamphetamine (ADDERALL) 30 MG tablet, Take 1 tablet by mouth 2 (two) times daily with a meal., Disp: 60 tablet, Rfl: 0 .  amphetamine-dextroamphetamine (ADDERALL) 30 MG tablet, Take 1 tablet by mouth 2 (two) times daily with a meal., Disp: 60 tablet, Rfl: 0 .  amphetamine-dextroamphetamine (ADDERALL) 30 MG tablet, Take 1 tablet by mouth 2 (two) times daily with a meal., Disp: 60 tablet, Rfl: 0 .  diazepam (VALIUM) 10 MG tablet, Take 1 tablet (10 mg total) by mouth at bedtime as needed for sleep., Disp: 30 tablet, Rfl: 3  Review of Systems  Constitutional: Positive for fever. Negative for appetite change and chills.  HENT: Positive for congestion. Negative for ear pain, hoarse voice and trouble swallowing.   Respiratory: Positive for cough (productive with clear colored mucus). Negative for chest tightness, shortness of breath and wheezing.   Cardiovascular: Negative for chest pain and palpitations.  Gastrointestinal: Negative for abdominal pain, diarrhea, nausea and vomiting.  Neurological: Negative for headaches.    Social History   Tobacco Use  . Smoking status: Never Smoker  . Smokeless tobacco: Never Used  Substance Use Topics  . Alcohol use: No    Alcohol/week: 0.0 standard drinks      Objective:   There were no  vitals taken for this visit. There were no vitals filed for this visit.   Physical Exam Constitutional:      Appearance: He is well-developed.  Pulmonary:     Effort: Pulmonary effort is normal. No respiratory distress.     Breath sounds: Normal breath sounds.  Neurological:     Mental Status: He is alert.  Psychiatric:        Mood and  Affect: Mood normal.        Behavior: Behavior normal.      No results found for any visits on 07/14/19.     Assessment & Plan    1. Viral URI  Counseled on quarantining for COVID. Should continue mucinex, increase fluids, and start daily 2nd gen antihistamine. May call back in one week if he develops sinus infection.  Patient called back reporting COVID testing is negative and that he is still having a temperature of > 100F. I ordered a chest xray for patient and advised he get this. Wife called back upset and said CXR was a waste of time and demanded he be sent an antibiotic and steroid so she doesn't get sick. Reminded of the above conversation. This is likely viral, I am still concerned for pneumonia. Per his wide's demands, will send z-pak and steroid. This patient should follow up with Dr. Caryn Section if he gets worse.  The entirety of the information documented in the History of Present Illness, Review of Systems and Physical Exam were personally obtained by me. Portions of this information were initially documented by Meyer Cory, CMA and reviewed by me for thoroughness and accuracy.   F/u PRN        Trinna Post, PA-C  [facility]

## 2019-07-15 ENCOUNTER — Telehealth: Payer: Self-pay

## 2019-07-15 DIAGNOSIS — R05 Cough: Secondary | ICD-10-CM

## 2019-07-15 DIAGNOSIS — R059 Cough, unspecified: Secondary | ICD-10-CM

## 2019-07-15 DIAGNOSIS — R509 Fever, unspecified: Secondary | ICD-10-CM

## 2019-07-15 MED ORDER — PREDNISONE 10 MG PO TABS: 10.0000 mg | ORAL_TABLET | Freq: Every day | ORAL | 0 refills | Status: AC

## 2019-07-15 MED ORDER — AZITHROMYCIN 250 MG PO TABS: ORAL_TABLET | ORAL | 0 refills | Status: DC

## 2019-07-15 NOTE — Telephone Encounter (Signed)
If he is still running a fever, he should get a CXR. I will order it for him. If there is an infection on the xray, I will send an antibiotic in. He will need to go to the hospital imaging to get an dray.

## 2019-07-15 NOTE — Addendum Note (Signed)
Addended by: Trinna Post on: 07/15/2019 10:41 AM   Modules accepted: Orders, Level of Service

## 2019-07-15 NOTE — Telephone Encounter (Signed)
done

## 2019-07-15 NOTE — Telephone Encounter (Signed)
We had a very long in depth discussion about how if he did not get better he could call back on Monday for an antibiotic, not today. Wife was present and expressed understanding and this is documented in my note. If he has a persistent fever, I would be concerned about pneumonia rather than a sinus infection, which at any rate could still be viral. If she is concerned about getting sick she should stay away from her husband. Per his wife's demands, I will send in a z pak and prednisone. If he has any further issues, he will need to be reassessed by Dr. Caryn Section.

## 2019-07-15 NOTE — Telephone Encounter (Signed)
Patient wife called and she is extremely upset.  She states she has to have surgery and is trying not to get sick from him.  She insists that it is a waste of time and money to send him for an x-ray.  She said he gets this every year and needs an antibiotic and Prednisone.  She said that when in the office they were told you would call something in.

## 2019-07-15 NOTE — Telephone Encounter (Signed)
Patient called to let you know his COVID was negative.  He would like to have something called in for his symptoms.  He was still running fever last night of over 100   Pharmacy-[facility] [phone]

## 2019-07-26 ENCOUNTER — Other Ambulatory Visit: Payer: Self-pay

## 2019-07-26 ENCOUNTER — Encounter: Payer: Self-pay | Admitting: Occupational Therapy

## 2019-07-26 ENCOUNTER — Ambulatory Visit: Payer: Self-pay | Attending: Orthopedic Surgery | Admitting: Occupational Therapy

## 2019-07-26 DIAGNOSIS — M6281 Muscle weakness (generalized): Secondary | ICD-10-CM | POA: Insufficient documentation

## 2019-07-26 DIAGNOSIS — M25642 Stiffness of left hand, not elsewhere classified: Secondary | ICD-10-CM | POA: Insufficient documentation

## 2019-07-26 DIAGNOSIS — M62838 Other muscle spasm: Secondary | ICD-10-CM | POA: Insufficient documentation

## 2019-07-26 DIAGNOSIS — M25532 Pain in left wrist: Secondary | ICD-10-CM | POA: Insufficient documentation

## 2019-07-26 DIAGNOSIS — M25612 Stiffness of left shoulder, not elsewhere classified: Secondary | ICD-10-CM | POA: Insufficient documentation

## 2019-07-26 DIAGNOSIS — M25671 Stiffness of right ankle, not elsewhere classified: Secondary | ICD-10-CM | POA: Insufficient documentation

## 2019-07-26 DIAGNOSIS — M25571 Pain in right ankle and joints of right foot: Secondary | ICD-10-CM | POA: Insufficient documentation

## 2019-07-26 DIAGNOSIS — M25632 Stiffness of left wrist, not elsewhere classified: Secondary | ICD-10-CM | POA: Insufficient documentation

## 2019-07-26 DIAGNOSIS — M25512 Pain in left shoulder: Secondary | ICD-10-CM | POA: Insufficient documentation

## 2019-07-26 NOTE — Therapy (Signed)
[facility] [address], [place], [address] Phone: [phone]   Fax:  [phone]  Occupational Therapy Evaluation  Patient Details  Name: Joel Burgess MRN: [id number] Date of Birth: 16-Jan-1976 Referring Provider (OT): Burney Gauze   Encounter Date: 07/26/2019  OT End of Session - 07/26/19 1732    Visit Number  1    Number of Visits  8    Date for OT Re-Evaluation  08/23/19    OT Start Time  1300    OT Stop Time  1357    OT Time Calculation (min)  57 min    Activity Tolerance  Patient tolerated treatment well    Behavior During Therapy  [facility] for tasks assessed/performed       Past Medical History:  Diagnosis Date  . ADHD (attention deficit hyperactivity disorder)   . Anxiety     Past Surgical History:  Procedure Laterality Date  . APPENDECTOMY    . CARPAL TUNNEL RELEASE Left 04/23/2019   Procedure: LEFT CARPAL TUNNEL RELEASE;  Surgeon: Charlotte Crumb, MD;  Location: [place];  Service: Orthopedics;  Laterality: Left;  . KNEE SURGERY Right   . OPEN REDUCTION INTERNAL FIXATION (ORIF) DISTAL RADIAL FRACTURE Left 04/23/2019   Procedure: OPEN REDUCTION INTERNAL FIXATION (ORIF) DISTAL RADIAL FRACTURE;  Surgeon: Charlotte Crumb, MD;  Location: [place];  Service: Orthopedics;  Laterality: Left;  AXILLARY BLOCK    There were no vitals filed for this visit.     [facility] OT Assessment - 07/26/19 0001      Assessment   Medical Diagnosis  R distal Radius fx ORIF    Referring Provider (OT)  Burney Gauze    Onset Date/Surgical Date  04/23/19    Hand Dominance  Right    Next MD Visit  --   25th Aug   Prior Therapy  in [place] follow surgery       Precautions   Precaution Comments  --   per pt has 5 lbs limited to pick up      [place]  Full time employment    Leisure  Cabin crew - yard work , fishing ,  work on his car       AROM   Right Forearm Pronation  90 Degrees    Right Forearm Supination  90 Degrees    Left Forearm Pronation  75 Degrees    Left Forearm Supination  50 Degrees    Right Wrist Extension  70 Degrees    Right Wrist Flexion  80 Degrees    Right Wrist Radial Deviation  28 Degrees    Right Wrist Ulnar Deviation  32 Degrees    Left Wrist Extension  45 Degrees    Left Wrist Flexion  42 Degrees    Left Wrist Radial Deviation  15 Degrees    Left Wrist Ulnar Deviation  32 Degrees      Strength   Right Hand Grip (lbs)  85    Right Hand Lateral Pinch  28 lbs    Right Hand 3 Point Pinch  19 lbs    Left Hand Grip (lbs)  15    Left Hand Lateral Pinch  8 lbs    Left Hand 3 Point Pinch  7 lbs      Left Hand AROM   L Thumb MCP 0-60  60 Degrees  L Thumb IP 0-80  40 Degrees    L Thumb Radial ADduction/ABduction 0-55  --   WNL   L Thumb Palmar ADduction/ABduction 0-45  --   WNL   L Thumb Opposition to Index  --   Oppsition to 4th 1 cm away   L Index  MCP 0-90  65 Degrees    L Index PIP 0-100  80 Degrees    L Long  MCP 0-90  70 Degrees    L Long PIP 0-100  85 Degrees    L Ring  MCP 0-90  65 Degrees    L Ring PIP 0-100  80 Degrees    L Little  MCP 0-90  45 Degrees    L Little PIP 0-100  80 Degrees        Paraffin done to L wrist - with stretches done and review HEP - with hand out provided     Heat  Prolonged stretch for sup stretch  Wall slides for wrist extention  Flexion stretch for wrist - prolonged   Followed by 16 oz hammer for sup /pro on lap  And RD, UD with 16oz hammer - 12 reps each   knuckle bender for MC flexion  AROM for composite fist in knuckle bender  Tendon glides  Opposition to digit to 4th and slide down 5 reps  And repeat opposition to 5th                OT Education - 07/26/19 1732    Education Details  fidings of eval and HEP    Person(s) Educated  Patient    Methods  Explanation;Demonstration;Tactile cues;Verbal  cues;Handout    Comprehension  Verbal cues required;Returned demonstration;Verbalized understanding       OT Short Term Goals - 07/26/19 1738      OT SHORT TERM GOAL #1   Title  Pt to be independing in HEP to increase digits flexion to touching palm and gripping tools    Baseline  MC's flexion 45-65 , PIP 80-85 - cannot grip anything less than 2 inch grip per pt    Period  Weeks    Status  New    Target Date  08/09/19      OT SHORT TERM GOAL #2   Title  L supination/pronation improve to WNL to turn doorknob and get change in hand    Baseline  pronation 75 , sup 50 degrees    Time  3    Period  Weeks    Status  New    Target Date  08/16/19        OT Long Term Goals - 07/26/19 1740      OT LONG TERM GOAL #1   Title  L wrist AROM improve to [facility] to using hand in more than 60% of act on PRWHE    Baseline  wrist RD, ext, flexion decrease - function score on PRWHE using hand only in 3 % act    Time  4    Period  Weeks    Status  New    Target Date  08/23/19      OT LONG TERM GOAL #2   Title  L grip strength and prehension strength increase to more than 50% compare to R hand to carry and grip ADL's act of about 5 lbs    Baseline  Grip L 15, R 85 lbs, Lat grip R 28 ,L 8 lbs  ; 3 point grip R 19 , L  7 lbs    Time  4    Period  Weeks    Status  New    Target Date  08/23/19      OT LONG TERM GOAL #3   Title  PRWHE pain score improve with more than 25 point    Baseline  score on PRWHE for pain at eval 32/50    Time  4    Period  Weeks    Status  New    Target Date  08/23/19            Plan - 07/26/19 1733    Clinical Impression Statement  Pt present at OT/hand therapy eval 13 wks s/p ORIF for L distal radius fx - pt had therapy at another clinic for few wks - but did not feel like he was making progress - pt present with decrease sup , wrist ext, flexion and RD with pain at radial side of wrist - with gripping and wrist extention - pt unable to make composite fist and  decrease MC flexion of all digits - as well as oppositin to 4thand 5th - decrease strength - was doing only about 1 lbs per pt -all limiting his functional use of L hand in ADl's and IADL's    OT Occupational Profile and History  Problem Focused Assessment - Including review of records relating to presenting problem    Occupational performance deficits (Please refer to evaluation for details):  ADL's;IADL's;Work;Play;Leisure;Social Participation    Body Structure / Function / Physical Skills  ADL;ROM;UE functional use;Scar mobility;FMC;Dexterity;Pain;Strength;IADL    Rehab Potential  Good    Clinical Decision Making  Limited treatment options, no task modification necessary    Comorbidities Affecting Occupational Performance:  None    Modification or Assistance to Complete Evaluation   No modification of tasks or assist necessary to complete eval    OT Frequency  2x / week    OT Duration  4 weeks    OT Treatment/Interventions  Self-care/ADL training;Therapeutic exercise;Patient/family education;Splinting;Paraffin;Fluidtherapy;Contrast Bath;Manual Therapy;Passive range of motion;Scar mobilization    Plan  assess progress and update HEP    OT Home Exercise Plan  see pt instruction    Consulted and Agree with Plan of Care  Patient       Patient will benefit from skilled therapeutic intervention in order to improve the following deficits and impairments:   Body Structure / Function / Physical Skills: ADL, ROM, UE functional use, Scar mobility, FMC, Dexterity, Pain, Strength, IADL       Visit Diagnosis: 1. Pain in left wrist   2. Stiffness of left wrist, not elsewhere classified   3. Stiffness of left hand, not elsewhere classified   4. Muscle weakness (generalized)       Problem List Patient Active Problem List   Diagnosis Date Noted  . Attention deficit hyperactivity disorder (ADHD) 06/10/2019  . Pain from implanted hardware 09/15/2017  . Closed displaced fracture of lateral  malleolus of right fibula 08/20/2016  . Closed displaced spiral fracture of shaft of right tibia 08/04/2016  . Anxiety, generalized 03/31/2015  . ADD (attention deficit disorder) 03/31/2015    Rosalyn Gess OTR/L,CLT 07/26/2019, 5:46 PM  [facility] [address], [place], [address] Phone: [phone]   Fax:  [phone]  Name: Urbano Milhouse MRN: [id number] Date of Birth: Apr 23, 1976

## 2019-07-26 NOTE — Patient Instructions (Signed)
Heat  Prolonged stretch for sup stretch  Wall slides for wrist extention  Flexion stretch for wrist - prolonged   Followed by 16 oz hammer for sup /pro on lap  And RD, UD with 16oz hammer - 12 reps each   knuckle bender for MC flexion  AROM for composite fist in knuckle bender  Tendon glides  Opposition to digit to 4th and slide down 5 reps  And repeat opposition to 5th

## 2019-07-29 ENCOUNTER — Other Ambulatory Visit: Payer: Self-pay

## 2019-07-29 ENCOUNTER — Ambulatory Visit: Payer: Self-pay | Admitting: Occupational Therapy

## 2019-07-29 DIAGNOSIS — M25571 Pain in right ankle and joints of right foot: Secondary | ICD-10-CM

## 2019-07-29 DIAGNOSIS — M25642 Stiffness of left hand, not elsewhere classified: Secondary | ICD-10-CM

## 2019-07-29 DIAGNOSIS — M25671 Stiffness of right ankle, not elsewhere classified: Secondary | ICD-10-CM

## 2019-07-29 DIAGNOSIS — M25632 Stiffness of left wrist, not elsewhere classified: Secondary | ICD-10-CM

## 2019-07-29 DIAGNOSIS — M6281 Muscle weakness (generalized): Secondary | ICD-10-CM

## 2019-07-29 DIAGNOSIS — M25532 Pain in left wrist: Secondary | ICD-10-CM

## 2019-07-29 NOTE — Patient Instructions (Addendum)
Same HEP but work on composite fist place and hold in palm  And 2 sets of 1 lbs weight sup ,pro, RD, UD  And one set of wrist flexion , extention 1 lbs  12 reps

## 2019-07-29 NOTE — Therapy (Signed)
[facility] [address], [place], [address] Phone: [phone]   Fax:  [phone]  Occupational Therapy Treatment  Patient Details  Name: Joel Burgess MRN: [id number] Date of Birth: 12-15-1976 Referring Provider (OT): Burney Gauze   Encounter Date: 07/29/2019  OT End of Session - 07/29/19 0929    Visit Number  2    Number of Visits  8    Date for OT Re-Evaluation  08/23/19    OT Start Time  0930    OT Stop Time  1020    OT Time Calculation (min)  50 min    Activity Tolerance  Patient tolerated treatment well    Behavior During Therapy  [facility] for tasks assessed/performed       Past Medical History:  Diagnosis Date  . ADHD (attention deficit hyperactivity disorder)   . Anxiety     Past Surgical History:  Procedure Laterality Date  . APPENDECTOMY    . CARPAL TUNNEL RELEASE Left 04/23/2019   Procedure: LEFT CARPAL TUNNEL RELEASE;  Surgeon: Charlotte Crumb, MD;  Location: [place];  Service: Orthopedics;  Laterality: Left;  . KNEE SURGERY Right   . OPEN REDUCTION INTERNAL FIXATION (ORIF) DISTAL RADIAL FRACTURE Left 04/23/2019   Procedure: OPEN REDUCTION INTERNAL FIXATION (ORIF) DISTAL RADIAL FRACTURE;  Surgeon: Charlotte Crumb, MD;  Location: [place];  Service: Orthopedics;  Laterality: Left;  AXILLARY BLOCK    There were no vitals filed for this visit.  Subjective Assessment - 07/29/19 0930    Subjective   Now that I know what to do - I can see progress - I can rotate my wrist better and making fist is better    Patient Stated Goals  I want to be able to use my  hand and wrist like before - so I can go back and to my work    Currently in Pain?  Yes    Pain Score  2     Pain Location  --   Radial side of wrist   Pain Orientation  Left    Pain Descriptors / Indicators  Aching    Pain Type  Surgical pain         [facility] OT Assessment - 07/29/19 0001      AROM   Left Forearm Pronation  90 Degrees    Left Forearm Supination  75 Degrees    Left Wrist Extension  54 Degrees    Left Wrist Flexion  56 Degrees    Left Wrist Radial Deviation  15 Degrees    Left Wrist Ulnar Deviation  34 Degrees      Strength   Left Hand Grip (lbs)  21    Left Hand Lateral Pinch  13 lbs    Left Hand 3 Point Pinch  10 lbs      Left Hand AROM   L Index  MCP 0-90  82 Degrees    L Index PIP 0-100  85 Degrees    L Long  MCP 0-90  88 Degrees    L Long PIP 0-100  85 Degrees    L Ring  MCP 0-90  88 Degrees    L Ring PIP 0-100  85 Degrees    L Little  MCP 0-90  80 Degrees    L Little PIP 0-100  65 Degrees       AROM for L digits and wrist AROM taken - see flowsheet - great progress just  in 3 days    PT screen pt for L shoulder pain since accident- rotation motion more pain full than flexion and ABD - will ask for PT eval and tx or      OT Treatments/Exercises (OP) - 07/29/19 0001      LUE Paraffin   Number Minutes Paraffin  8 Minutes    LUE Paraffin Location  Hand;Wrist    Comments  prior to soft tissue and PROM        Paraffin done to L wrist and hand  - with some scar massage and MC spreads and CT spreads - joint mobs to [facility] prior to tendon glides and place and hold composite fist to palm - done great - pt to do at home       CPM for wrist flexion and extention done each 200 sec   Followed by 16 oz hammer for sup /pro on lap - can do 2 sets  And RD, UD with 16oz hammer - 12 reps each - can do 2 sets  Add and done wrist flexion , extention 12 reps - 16oz hammer    knuckle bender for MC flexion  To to cont with  AROM for composite fist in knuckle bender  Tendon glides - with focus on composite fist place and hold into palm  Opposition to digit to 4th and slide down 5 reps  And repeat opposition to 5th - this date able to get to 2nd fold of 5th   Great progress        OT Education - 07/29/19 0948    Person(s) Educated  Patient    Methods   Explanation;Demonstration;Tactile cues;Verbal cues;Handout    Comprehension  Verbal cues required;Returned demonstration;Verbalized understanding       OT Short Term Goals - 07/26/19 1738      OT SHORT TERM GOAL #1   Title  Pt to be independing in HEP to increase digits flexion to touching palm and gripping tools    Baseline  MC's flexion 45-65 , PIP 80-85 - cannot grip anything less than 2 inch grip per pt    Period  Weeks    Status  New    Target Date  08/09/19      OT SHORT TERM GOAL #2   Title  L supination/pronation improve to WNL to turn doorknob and get change in hand    Baseline  pronation 75 , sup 50 degrees    Time  3    Period  Weeks    Status  New    Target Date  08/16/19        OT Long Term Goals - 07/26/19 1740      OT LONG TERM GOAL #1   Title  L wrist AROM improve to [facility] to using hand in more than 60% of act on PRWHE    Baseline  wrist RD, ext, flexion decrease - function score on PRWHE using hand only in 3 % act    Time  4    Period  Weeks    Status  New    Target Date  08/23/19      OT LONG TERM GOAL #2   Title  L grip strength and prehension strength increase to more than 50% compare to R hand to carry and grip ADL's act of about 5 lbs    Baseline  Grip L 15, R 85 lbs, Lat grip R 28 ,L 8 lbs  ; 3 point grip R 19 ,  L 7 lbs    Time  4    Period  Weeks    Status  New    Target Date  08/23/19      OT LONG TERM GOAL #3   Title  PRWHE pain score improve with more than 25 point    Baseline  score on PRWHE for pain at eval 32/50    Time  4    Period  Weeks    Status  New    Target Date  08/23/19            Plan - 07/29/19 2956    Clinical Impression Statement  Pt is about 13 1/2 wks s/p ORIF for L distal radius fx - pt showed great progress just in the last 3 days - in all motion for digits and wrist-  pt to cont with HEP and add this date strengthning for wrist flexion , extentio - and had PT screen pt for L shoulder pain since accident - will  ask for order for PT to eval and tx -    OT Occupational Profile and History  Problem Focused Assessment - Including review of records relating to presenting problem    Occupational performance deficits (Please refer to evaluation for details):  ADL's;IADL's;Work;Play;Leisure;Social Participation    Body Structure / Function / Physical Skills  ADL;ROM;UE functional use;Scar mobility;FMC;Dexterity;Pain;Strength;IADL    Rehab Potential  Good    Clinical Decision Making  Limited treatment options, no task modification necessary    Comorbidities Affecting Occupational Performance:  None    Modification or Assistance to Complete Evaluation   No modification of tasks or assist necessary to complete eval    OT Frequency  2x / week    OT Duration  4 weeks    OT Treatment/Interventions  Self-care/ADL training;Therapeutic exercise;Patient/family education;Splinting;Paraffin;Fluidtherapy;Contrast Bath;Manual Therapy;Passive range of motion;Scar mobilization    Plan  assess progress and update HEP    OT Home Exercise Plan  see pt instruction    Consulted and Agree with Plan of Care  Patient       Patient will benefit from skilled therapeutic intervention in order to improve the following deficits and impairments:   Body Structure / Function / Physical Skills: ADL, ROM, UE functional use, Scar mobility, FMC, Dexterity, Pain, Strength, IADL       Visit Diagnosis: 1. Pain in left wrist   2. Stiffness of left wrist, not elsewhere classified   3. Stiffness of left hand, not elsewhere classified   4. Muscle weakness (generalized)   5. Stiffness of right ankle, not elsewhere classified   6. Pain in right ankle and joints of right foot       Problem List Patient Active Problem List   Diagnosis Date Noted  . Attention deficit hyperactivity disorder (ADHD) 06/10/2019  . Pain from implanted hardware 09/15/2017  . Closed displaced fracture of lateral malleolus of right fibula 08/20/2016  . Closed  displaced spiral fracture of shaft of right tibia 08/04/2016  . Anxiety, generalized 03/31/2015  . ADD (attention deficit disorder) 03/31/2015    Rosalyn Gess OTR/L,CLT 07/29/2019, 12:16 PM  [facility] [address] [place], [address] Phone: [phone]   Fax:  [phone]  Name: Dorse Locy MRN: [id number] Date of Birth: 1976-01-04

## 2019-08-03 ENCOUNTER — Ambulatory Visit: Payer: Self-pay | Admitting: Occupational Therapy

## 2019-08-03 ENCOUNTER — Other Ambulatory Visit: Payer: Self-pay

## 2019-08-03 DIAGNOSIS — M25671 Stiffness of right ankle, not elsewhere classified: Secondary | ICD-10-CM

## 2019-08-03 DIAGNOSIS — M6281 Muscle weakness (generalized): Secondary | ICD-10-CM

## 2019-08-03 DIAGNOSIS — M25571 Pain in right ankle and joints of right foot: Secondary | ICD-10-CM

## 2019-08-03 DIAGNOSIS — M25642 Stiffness of left hand, not elsewhere classified: Secondary | ICD-10-CM

## 2019-08-03 DIAGNOSIS — M25632 Stiffness of left wrist, not elsewhere classified: Secondary | ICD-10-CM

## 2019-08-03 DIAGNOSIS — M25532 Pain in left wrist: Secondary | ICD-10-CM

## 2019-08-03 NOTE — Therapy (Signed)
[place] [facility] [address], [place], [address] Phone: [phone]   Fax:  [phone]  Occupational Therapy Treatment  Patient Details  Name: Joel Burgess MRN: [id number] Date of Birth: Mar 24, 1976 Referring Provider (OT): Burney Gauze   Encounter Date: 08/03/2019  OT End of Session - 08/03/19 0944    Visit Number  3    Number of Visits  8    Date for OT Re-Evaluation  08/23/19    OT Start Time  0915    OT Stop Time  1002    OT Time Calculation (min)  47 min    Activity Tolerance  Patient tolerated treatment well    Behavior During Therapy  [facility] for tasks assessed/performed       Past Medical History:  Diagnosis Date  . ADHD (attention deficit hyperactivity disorder)   . Anxiety     Past Surgical History:  Procedure Laterality Date  . APPENDECTOMY    . CARPAL TUNNEL RELEASE Left 04/23/2019   Procedure: LEFT CARPAL TUNNEL RELEASE;  Surgeon: Charlotte Crumb, MD;  Location: [place];  Service: Orthopedics;  Laterality: Left;  . KNEE SURGERY Right   . OPEN REDUCTION INTERNAL FIXATION (ORIF) DISTAL RADIAL FRACTURE Left 04/23/2019   Procedure: OPEN REDUCTION INTERNAL FIXATION (ORIF) DISTAL RADIAL FRACTURE;  Surgeon: Charlotte Crumb, MD;  Location: [place];  Service: Orthopedics;  Laterality: Left;  AXILLARY BLOCK    There were no vitals filed for this visit.  Subjective Assessment - 08/03/19 0934    Subjective   I think I am better again - more motion and strength - but my shoulder pain still bother me and pain at my wrist too -with bending down - sideways    Patient Stated Goals  I want to be able to use my  hand and wrist like before - so I can go back and to my work    Currently in Pain?  Yes    Pain Score  6     Pain Location  Wrist    Pain Orientation  Left    Pain Descriptors / Indicators  Aching;Tender;Sharp    Pain Type  Surgical pain    Pain Onset  1 to 4 weeks  ago    Aggravating Factors   Wrist flexion , RD, tenderness over FCR         [facility] OT Assessment - 08/03/19 0001      AROM   Left Forearm Pronation  90 Degrees    Left Forearm Supination  85 Degrees    Left Wrist Extension  60 Degrees    Left Wrist Flexion  58 Degrees    Left Wrist Radial Deviation  20 Degrees    Left Wrist Ulnar Deviation  35 Degrees      Strength   Right Hand Grip (lbs)  85    Right Hand Lateral Pinch  28 lbs    Right Hand 3 Point Pinch  19 lbs    Left Hand Grip (lbs)  41    Left Hand Lateral Pinch  19 lbs    Left Hand 3 Point Pinch  12.5 lbs      Left Hand AROM   L Thumb MCP 0-60  60 Degrees    L Thumb IP 0-80  45 Degrees    L Index  MCP 0-90  85 Degrees    L Index PIP 0-100  95 Degrees    L Long  MCP  0-90  90 Degrees    L Long PIP 0-100  100 Degrees    L Ring  MCP 0-90  85 Degrees    L Ring PIP 0-100  100 Degrees    L Little  MCP 0-90  85 Degrees    L Little PIP 0-100  100 Degrees      cont to make great progress in AROM in digits and wrist , strength at wrist and grip strength See flowsheet   pt report still pain on volar wrist with end range flexion , ext, RD of wrist  Appear pt has pain with resistance to FCR - ? tendinis           OT Treatments/Exercises (OP) - 08/03/19 0001      LUE Paraffin   Number Minutes Paraffin  8 Minutes    LUE Paraffin Location  Hand;Wrist    Comments  prior to stretches and soft tissue         Paraffin done to L wrist and hand  - with some scar massage and MC spreads and CT spreads - joint mobs to [facility] prior to tendon glides and place and hold composite fist to palm - done great - pt to do at home  ed on use and taping of kinesiotape to scar - 30% parallel, and 2 100% pull across  xtra tape provided     CPM for wrist flexion and extention done each 200 sec pain free   Followed by  Upgrade to 2 lbs for sup /pro on lap - can do 1 sets  And RD, UD 2 lbs - 12 reps each - can do 1 set And wrist  flexion , ext - but pain free going into flexion - 12 reps    knuckle bender for MC flexion  To to cont with  AROM for composite fist in knuckle bender  Tendon glides - with focus on composite fist place and hold into palm  Opposition to digit to 4th and slide down 5 reps  And repeat opposition to 5th- this date able to get to base of 5th   Green putty provided for gripping , lat and 3 point grip this date  12-15 reps  Pain free 2 x day        OT Education - 08/03/19 0937    Education Details  upgrade HEP    Person(s) Educated  Patient    Methods  Explanation;Demonstration;Tactile cues;Verbal cues;Handout    Comprehension  Verbal cues required;Returned demonstration;Verbalized understanding       OT Short Term Goals - 07/26/19 1738      OT SHORT TERM GOAL #1   Title  Pt to be independing in HEP to increase digits flexion to touching palm and gripping tools    Baseline  MC's flexion 45-65 , PIP 80-85 - cannot grip anything less than 2 inch grip per pt    Period  Weeks    Status  New    Target Date  08/09/19      OT SHORT TERM GOAL #2   Title  L supination/pronation improve to WNL to turn doorknob and get change in hand    Baseline  pronation 75 , sup 50 degrees    Time  3    Period  Weeks    Status  New    Target Date  08/16/19        OT Long Term Goals - 07/26/19 1740      OT LONG  TERM GOAL #1   Title  L wrist AROM improve to [facility] to using hand in more than 60% of act on PRWHE    Baseline  wrist RD, ext, flexion decrease - function score on PRWHE using hand only in 3 % act    Time  4    Period  Weeks    Status  New    Target Date  08/23/19      OT LONG TERM GOAL #2   Title  L grip strength and prehension strength increase to more than 50% compare to R hand to carry and grip ADL's act of about 5 lbs    Baseline  Grip L 15, R 85 lbs, Lat grip R 28 ,L 8 lbs  ; 3 point grip R 19 , L 7 lbs    Time  4    Period  Weeks    Status  New    Target Date  08/23/19       OT LONG TERM GOAL #3   Title  PRWHE pain score improve with more than 25 point    Baseline  score on PRWHE for pain at eval 32/50    Time  4    Period  Weeks    Status  New    Target Date  08/23/19            Plan - 08/03/19 1105    Clinical Impression Statement  Pt is about 14 wks s/p ORIF L distal radius fx - pt making great progress in digits flexion, opposition and wrist AROM and strenght - pt do complain since eval about pain on volar /radial wrist with flexion , extention end range , RD end range - appear pt has maybe some tendinitis of FCR - was able this date to increase his putty resistance , and weight to 2 lbs - but pt to do pain free HEP -    OT Occupational Profile and History  Problem Focused Assessment - Including review of records relating to presenting problem    Occupational performance deficits (Please refer to evaluation for details):  ADL's;IADL's;Work;Play;Leisure;Social Participation    Body Structure / Function / Physical Skills  ADL;ROM;UE functional use;Scar mobility;FMC;Dexterity;Pain;Strength;IADL    Rehab Potential  Good    Clinical Decision Making  Limited treatment options, no task modification necessary    Comorbidities Affecting Occupational Performance:  None    Modification or Assistance to Complete Evaluation   No modification of tasks or assist necessary to complete eval    OT Frequency  2x / week    OT Duration  4 weeks    OT Treatment/Interventions  Self-care/ADL training;Therapeutic exercise;Patient/family education;Splinting;Paraffin;Fluidtherapy;Contrast Bath;Manual Therapy;Passive range of motion;Scar mobilization    Plan  assess progress and update HEP    OT Home Exercise Plan  see pt instruction    Consulted and Agree with Plan of Care  Patient       Patient will benefit from skilled therapeutic intervention in order to improve the following deficits and impairments:   Body Structure / Function / Physical Skills: ADL, ROM, UE  functional use, Scar mobility, FMC, Dexterity, Pain, Strength, IADL       Visit Diagnosis: 1. Stiffness of left wrist, not elsewhere classified   2. Stiffness of left hand, not elsewhere classified   3. Muscle weakness (generalized)   4. Stiffness of right ankle, not elsewhere classified   5. Pain in right ankle and joints of right foot   6. Pain in  left wrist       Problem List Patient Active Problem List   Diagnosis Date Noted  . Attention deficit hyperactivity disorder (ADHD) 06/10/2019  . Pain from implanted hardware 09/15/2017  . Closed displaced fracture of lateral malleolus of right fibula 08/20/2016  . Closed displaced spiral fracture of shaft of right tibia 08/04/2016  . Anxiety, generalized 03/31/2015  . ADD (attention deficit disorder) 03/31/2015    Rosalyn Gess OTR/l,CLT 08/03/2019, 11:08 AM  [place] [facility] [address] [place], [address] Phone: [phone]   Fax:  [phone]  Name: Joel Burgess MRN: [id number] Date of Birth: [birth year]

## 2019-08-03 NOTE — Patient Instructions (Signed)
Cont with stretches for wrist  And digits exercises   Increase wrist HEP to 2 lbs weight  But only 12 reps - 2 x  Day pain free  For wrist in all planes  Putty increase to medium green - for gripping , lat and 3 point grip  12-15 reps  And not to over do

## 2019-08-05 ENCOUNTER — Ambulatory Visit: Payer: Self-pay | Admitting: Occupational Therapy

## 2019-08-05 ENCOUNTER — Other Ambulatory Visit: Payer: Self-pay

## 2019-08-05 DIAGNOSIS — M25642 Stiffness of left hand, not elsewhere classified: Secondary | ICD-10-CM

## 2019-08-05 DIAGNOSIS — M25671 Stiffness of right ankle, not elsewhere classified: Secondary | ICD-10-CM

## 2019-08-05 DIAGNOSIS — M6281 Muscle weakness (generalized): Secondary | ICD-10-CM

## 2019-08-05 DIAGNOSIS — M25632 Stiffness of left wrist, not elsewhere classified: Secondary | ICD-10-CM

## 2019-08-05 DIAGNOSIS — M25532 Pain in left wrist: Secondary | ICD-10-CM

## 2019-08-05 NOTE — Therapy (Signed)
[place] [facility] [address] [place], [address] Phone: [phone]   Fax:  [phone]  Occupational Therapy Treatment  Patient Details  Name: Joel Burgess MRN: [id number] Date of Birth: 19-Feb-1976 Referring Provider (OT): Burney Gauze   Encounter Date: 08/05/2019  OT End of Session - 08/05/19 1930    Visit Number  4    Number of Visits  8    Date for OT Re-Evaluation  08/23/19    OT Start Time  0930    OT Stop Time  1014    OT Time Calculation (min)  44 min    Activity Tolerance  Patient tolerated treatment well    Behavior During Therapy  [facility] for tasks assessed/performed       Past Medical History:  Diagnosis Date  . ADHD (attention deficit hyperactivity disorder)   . Anxiety     Past Surgical History:  Procedure Laterality Date  . APPENDECTOMY    . CARPAL TUNNEL RELEASE Left 04/23/2019   Procedure: LEFT CARPAL TUNNEL RELEASE;  Surgeon: Charlotte Crumb, MD;  Location: [place];  Service: Orthopedics;  Laterality: Left;  . KNEE SURGERY Right   . OPEN REDUCTION INTERNAL FIXATION (ORIF) DISTAL RADIAL FRACTURE Left 04/23/2019   Procedure: OPEN REDUCTION INTERNAL FIXATION (ORIF) DISTAL RADIAL FRACTURE;  Surgeon: Charlotte Crumb, MD;  Location: [place];  Service: Orthopedics;  Laterality: Left;  AXILLARY BLOCK    There were no vitals filed for this visit.  Subjective Assessment - 08/05/19 0936    Subjective   I could squeeze the putty - shoulder still bothers me with certain motions - still that pain on base of thumb /side of wrist    Patient Stated Goals  I want to be able to use my  hand and wrist like before - so I can go back and to my work    Currently in Pain?  Yes    Pain Score  5     Pain Location  Wrist    Pain Orientation  Left    Pain Descriptors / Indicators  Aching;Tender    Pain Type  Surgical pain    Pain Onset  1 to 4 weeks ago         [facility] OT  Assessment - 08/05/19 0001      AROM   Right Wrist Extension  70 Degrees    Right Wrist Flexion  80 Degrees    Right Wrist Radial Deviation  28 Degrees    Right Wrist Ulnar Deviation  32 Degrees    Left Wrist Extension  62 Degrees    Left Wrist Flexion  65 Degrees    Left Wrist Radial Deviation  22 Degrees    Left Wrist Ulnar Deviation  35 Degrees      Strength   Right Hand Grip (lbs)  85    Right Hand Lateral Pinch  28 lbs    Right Hand 3 Point Pinch  19 lbs    Left Hand Grip (lbs)  55    Left Hand Lateral Pinch  20 lbs    Left Hand 3 Point Pinch  14 lbs      Left Hand AROM   L Index  MCP 0-90  80 Degrees    L Index PIP 0-100  95 Degrees    L Long  MCP 0-90  90 Degrees    L Long PIP 0-100  100 Degrees    L Ring  MCP 0-90  90 Degrees    L Ring PIP 0-100  100 Degrees    L Little  MCP 0-90  90 Degrees    L Little PIP 0-100  100 Degrees      Pt cont to make progress in AROM and strength L wrist and grip /prehension strength  scar improving  But cont to have pain over appear FCR ? Or Dequervain - or combination from [facility] with me          OT Treatments/Exercises (OP) - 08/05/19 0001      LUE Paraffin   Number Minutes Paraffin  8 Minutes    LUE Paraffin Location  Hand;Wrist    Comments  stretch for sup, ext and flexion done        Paraffin done to L wristand hand- with some scar massage and MC spreads and CT spreads - joint mobs to [facility] prior to tendon glides and place and hold composite fist to palm - done great - pt to do at home ed on use and taping of kinesiotape to scar - 30% parallel, and 2 100% pull across  xtra tape provided    CPM for wrist flexion and extention done each 200 secpain free   Review  2 lbs for sup /pro on lap- can do 1 sets12 or and increase to 2 sets - can do 4 x 6 too  And RD, UD 2 lbs - same reps and sets  And wrist flexion , ext - but pain free going into flexion - 12 reps    knuckle bender for MC flexionTo to cont  with AROM for composite fist in knuckle bender  Tendon glides- with focus on composite fist place and hold into palm Opposition to digit to 4th and slide down 5 reps  And repeat opposition to 5th- this date able to get to base of 5th   Green putty to cont with  for gripping , lat and 3 point grip this date  12-15 reps  Pain free 2 x day        OT Education - 08/05/19 1930    Education Details  progress and not to over do flexion of wrist with weight    Person(s) Educated  Patient    Methods  Explanation;Demonstration;Tactile cues;Verbal cues;Handout    Comprehension  Verbal cues required;Returned demonstration;Verbalized understanding       OT Short Term Goals - 07/26/19 1738      OT SHORT TERM GOAL #1   Title  Pt to be independing in HEP to increase digits flexion to touching palm and gripping tools    Baseline  MC's flexion 45-65 , PIP 80-85 - cannot grip anything less than 2 inch grip per pt    Period  Weeks    Status  New    Target Date  08/09/19      OT SHORT TERM GOAL #2   Title  L supination/pronation improve to WNL to turn doorknob and get change in hand    Baseline  pronation 75 , sup 50 degrees    Time  3    Period  Weeks    Status  New    Target Date  08/16/19        OT Long Term Goals - 07/26/19 1740      OT LONG TERM GOAL #1   Title  L wrist AROM improve to [facility] to using hand in more than 60% of act on PRWHE  Baseline  wrist RD, ext, flexion decrease - function score on PRWHE using hand only in 3 % act    Time  4    Period  Weeks    Status  New    Target Date  08/23/19      OT LONG TERM GOAL #2   Title  L grip strength and prehension strength increase to more than 50% compare to R hand to carry and grip ADL's act of about 5 lbs    Baseline  Grip L 15, R 85 lbs, Lat grip R 28 ,L 8 lbs  ; 3 point grip R 19 , L 7 lbs    Time  4    Period  Weeks    Status  New    Target Date  08/23/19      OT LONG TERM GOAL #3   Title  PRWHE pain score  improve with more than 25 point    Baseline  score on PRWHE for pain at eval 32/50    Time  4    Period  Weeks    Status  New    Target Date  08/23/19            Plan - 08/05/19 1931    Clinical Impression Statement  Pt is 15wks s/p ORIF L distal radius fx - pt made in 4 visit fantastic progress in AROM , PROM , strength at wrist and grip strength - pt do have pain from [facility] on radial/volar wrist - ? FCR tendinitis or deQuervain - reinforce for pt to not over do strengthening of wrist flexion - and extention stretch pain less than 2/10 to keep - cont to increase strength , ROM and decrease pain    OT Occupational Profile and History  Problem Focused Assessment - Including review of records relating to presenting problem    Occupational performance deficits (Please refer to evaluation for details):  ADL's;IADL's;Work;Play;Leisure;Social Participation    Body Structure / Function / Physical Skills  ADL;ROM;UE functional use;Scar mobility;FMC;Dexterity;Pain;Strength;IADL    Rehab Potential  Good    Clinical Decision Making  Limited treatment options, no task modification necessary    Comorbidities Affecting Occupational Performance:  None    Modification or Assistance to Complete Evaluation   No modification of tasks or assist necessary to complete eval    OT Frequency  2x / week    OT Duration  4 weeks    OT Treatment/Interventions  Self-care/ADL training;Therapeutic exercise;Patient/family education;Splinting;Paraffin;Fluidtherapy;Contrast Bath;Manual Therapy;Passive range of motion;Scar mobilization    Plan  assess progress and update HEP    OT Home Exercise Plan  see pt instruction    Consulted and Agree with Plan of Care  Patient       Patient will benefit from skilled therapeutic intervention in order to improve the following deficits and impairments:   Body Structure / Function / Physical Skills: ADL, ROM, UE functional use, Scar mobility, FMC, Dexterity, Pain, Strength, IADL        Visit Diagnosis: 1. Stiffness of left wrist, not elsewhere classified   2. Stiffness of left hand, not elsewhere classified   3. Muscle weakness (generalized)   4. Stiffness of right ankle, not elsewhere classified   5. Pain in left wrist       Problem List Patient Active Problem List   Diagnosis Date Noted  . Attention deficit hyperactivity disorder (ADHD) 06/10/2019  . Pain from implanted hardware 09/15/2017  . Closed displaced fracture of lateral malleolus of right  fibula 08/20/2016  . Closed displaced spiral fracture of shaft of right tibia 08/04/2016  . Anxiety, generalized 03/31/2015  . ADD (attention deficit disorder) 03/31/2015    Rosalyn Gess OTR/L,CLT 08/05/2019, 7:36 PM  [place] [facility] [address], [place], [address] Phone: [phone]   Fax:  [phone]  Name: Joel Burgess MRN: [id number] Date of Birth: 1976/03/17

## 2019-08-05 NOTE — Patient Instructions (Signed)
Same - but to over do flexion with weight

## 2019-08-10 ENCOUNTER — Ambulatory Visit: Payer: Self-pay | Admitting: Occupational Therapy

## 2019-08-10 ENCOUNTER — Other Ambulatory Visit: Payer: Self-pay

## 2019-08-10 DIAGNOSIS — M25632 Stiffness of left wrist, not elsewhere classified: Secondary | ICD-10-CM

## 2019-08-10 DIAGNOSIS — M6281 Muscle weakness (generalized): Secondary | ICD-10-CM

## 2019-08-10 DIAGNOSIS — M25671 Stiffness of right ankle, not elsewhere classified: Secondary | ICD-10-CM

## 2019-08-10 DIAGNOSIS — M25571 Pain in right ankle and joints of right foot: Secondary | ICD-10-CM

## 2019-08-10 DIAGNOSIS — M25532 Pain in left wrist: Secondary | ICD-10-CM

## 2019-08-10 DIAGNOSIS — M25642 Stiffness of left hand, not elsewhere classified: Secondary | ICD-10-CM

## 2019-08-10 NOTE — Patient Instructions (Signed)
Pt to hold off on wrist ext, flexion with 2lbs Can do AAROM for wrist flexion , ext Can cont with 2 lbs for RD,UD, sup/pro if pain free  Hold off on putty for gripping  And fitted with wrist prefab splint when using or moving wrist

## 2019-08-10 NOTE — Therapy (Signed)
[facility] [address] [place], [address] Phone: [phone]   Fax:  [phone]  Occupational Therapy Treatment  Patient Details  Name: Joel Burgess MRN: [id number] Date of Birth: 11-02-1976 Referring Provider (OT): Burney Gauze   Encounter Date: 08/10/2019  OT End of Session - 08/10/19 0907    Visit Number  5    Number of Visits  8    Date for OT Re-Evaluation  08/23/19    OT Start Time  0816    OT Stop Time  0900    OT Time Calculation (min)  44 min    Activity Tolerance  Patient tolerated treatment well    Behavior During Therapy  [facility] for tasks assessed/performed       Past Medical History:  Diagnosis Date  . ADHD (attention deficit hyperactivity disorder)   . Anxiety     Past Surgical History:  Procedure Laterality Date  . APPENDECTOMY    . CARPAL TUNNEL RELEASE Left 04/23/2019   Procedure: LEFT CARPAL TUNNEL RELEASE;  Surgeon: Charlotte Crumb, MD;  Location: [place];  Service: Orthopedics;  Laterality: Left;  . KNEE SURGERY Right   . OPEN REDUCTION INTERNAL FIXATION (ORIF) DISTAL RADIAL FRACTURE Left 04/23/2019   Procedure: OPEN REDUCTION INTERNAL FIXATION (ORIF) DISTAL RADIAL FRACTURE;  Surgeon: Charlotte Crumb, MD;  Location: [place];  Service: Orthopedics;  Laterality: Left;  AXILLARY BLOCK    There were no vitals filed for this visit.  Subjective Assessment - 08/10/19 0821    Subjective   My wrist still hurts when I do the wrist up - only done 5 reps- used the  2lbs and could do the other ones- putty did okay -only did the gripping    Patient Stated Goals  I want to be able to use my  hand and wrist like before - so I can go back and to my work    Currently in Pain?  Yes    Pain Score  3     Pain Location  Wrist   radial volar   Pain Orientation  Left    Pain Descriptors / Indicators  Sharp    Pain Type  Surgical pain    Aggravating Factors    wrist flexion and extention         [facility] OT Assessment - 08/10/19 0001      AROM   Left Wrist Extension  63 Degrees    Left Wrist Flexion  68 Degrees      Strength   Right Hand Grip (lbs)  85    Right Hand Lateral Pinch  28 lbs    Right Hand 3 Point Pinch  19 lbs    Left Hand Grip (lbs)  55    Left Hand Lateral Pinch  19 lbs    Left Hand 3 Point Pinch  13 lbs      cont to have tenderness over distal radial volar wrist - ? FCR  Pain with flexion and extention of wrist  This date edema  pt to hold off on weight 2 lbs for wrist flexion , ext And putty gripping          OT Treatments/Exercises (OP) - 08/10/19 0001      Ultrasound   Ultrasound Location  volar radial wrist L    Ultrasound Parameters  3.3MHZ 20%, 0.8    Ultrasound Goals  Edema;Pain      LUE Paraffin   Number  Minutes Paraffin  8 Minutes    LUE Paraffin Location  Hand;Wrist    Comments  SOC to decrease pain and increase ROM for wrist      Can do AAROM for wrist flexion , ext pain free range  Can cont with 2 lbs for RD,UD, sup/pro if pain free  Hold off on putty for gripping  And fitted with wrist prefab splint when using or moving wrist  Done some soft tissue mobs to scar and volar wrist and forearm xtractor done this date to distal scar with some AROM for fisting  Tolerate well          OT Education - 08/10/19 0906    Education Details  wrist pain assess -and change HEP    Person(s) Educated  Patient    Methods  Explanation;Demonstration;Tactile cues;Verbal cues;Handout    Comprehension  Verbal cues required;Returned demonstration;Verbalized understanding       OT Short Term Goals - 07/26/19 1738      OT SHORT TERM GOAL #1   Title  Pt to be independing in HEP to increase digits flexion to touching palm and gripping tools    Baseline  MC's flexion 45-65 , PIP 80-85 - cannot grip anything less than 2 inch grip per pt    Period  Weeks    Status  New    Target Date  08/09/19      OT  SHORT TERM GOAL #2   Title  L supination/pronation improve to WNL to turn doorknob and get change in hand    Baseline  pronation 75 , sup 50 degrees    Time  3    Period  Weeks    Status  New    Target Date  08/16/19        OT Long Term Goals - 07/26/19 1740      OT LONG TERM GOAL #1   Title  L wrist AROM improve to [facility] to using hand in more than 60% of act on PRWHE    Baseline  wrist RD, ext, flexion decrease - function score on PRWHE using hand only in 3 % act    Time  4    Period  Weeks    Status  New    Target Date  08/23/19      OT LONG TERM GOAL #2   Title  L grip strength and prehension strength increase to more than 50% compare to R hand to carry and grip ADL's act of about 5 lbs    Baseline  Grip L 15, R 85 lbs, Lat grip R 28 ,L 8 lbs  ; 3 point grip R 19 , L 7 lbs    Time  4    Period  Weeks    Status  New    Target Date  08/23/19      OT LONG TERM GOAL #3   Title  PRWHE pain score improve with more than 25 point    Baseline  score on PRWHE for pain at eval 32/50    Time  4    Period  Weeks    Status  New    Target Date  08/23/19            Plan - 08/10/19 0907    Clinical Impression Statement  Pt is about 15 1/2 wks s/p ORIF L distal radius fx- pt made fantatic progress since [facility] in 5 sessions - but pain on radial volar wrist still present that  he had at eval - preventing [place] from working on strength for grip and wrist flexion , ext- done some pulse [place] this date and fitted pt with wrist splint to wear most all the time this week to rest FCR -and will cont to reasses this week - can cont to work on motion pain free range    OT Occupational Profile and History  Problem Focused Assessment - Including review of records relating to presenting problem    Occupational performance deficits (Please refer to evaluation for details):  ADL's;IADL's;Work;Play;Leisure;Social Participation    Body Structure / Function / Physical Skills  ADL;ROM;UE functional use;Scar  mobility;FMC;Dexterity;Pain;Strength;IADL    Rehab Potential  Good    Clinical Decision Making  Limited treatment options, no task modification necessary    Comorbidities Affecting Occupational Performance:  None    Modification or Assistance to Complete Evaluation   No modification of tasks or assist necessary to complete eval    OT Frequency  2x / week    OT Duration  4 weeks    OT Treatment/Interventions  Self-care/ADL training;Therapeutic exercise;Patient/family education;Splinting;Paraffin;Fluidtherapy;Contrast Bath;Manual Therapy;Passive range of motion;Scar mobilization    Plan  assess progress wiht pain and wearing of splint    OT Home Exercise Plan  see pt instruction    Consulted and Agree with Plan of Care  Patient       Patient will benefit from skilled therapeutic intervention in order to improve the following deficits and impairments:   Body Structure / Function / Physical Skills: ADL, ROM, UE functional use, Scar mobility, FMC, Dexterity, Pain, Strength, IADL       Visit Diagnosis: 1. Stiffness of left wrist, not elsewhere classified   2. Stiffness of left hand, not elsewhere classified   3. Muscle weakness (generalized)   4. Stiffness of right ankle, not elsewhere classified   5. Pain in left wrist   6. Pain in right ankle and joints of right foot       Problem List Patient Active Problem List   Diagnosis Date Noted  . Attention deficit hyperactivity disorder (ADHD) 06/10/2019  . Pain from implanted hardware 09/15/2017  . Closed displaced fracture of lateral malleolus of right fibula 08/20/2016  . Closed displaced spiral fracture of shaft of right tibia 08/04/2016  . Anxiety, generalized 03/31/2015  . ADD (attention deficit disorder) 03/31/2015    Rosalyn Gess OTR/L,CLT 08/10/2019, 9:12 AM  [facility] [address] [place], [address] Phone: [phone]   Fax:   [phone]  Name: Furqan Gosselin MRN: [id number] Date of Birth: Mar 15, 1976

## 2019-08-12 ENCOUNTER — Ambulatory Visit: Payer: Self-pay | Admitting: Occupational Therapy

## 2019-08-12 ENCOUNTER — Other Ambulatory Visit: Payer: Self-pay

## 2019-08-12 DIAGNOSIS — M25632 Stiffness of left wrist, not elsewhere classified: Secondary | ICD-10-CM

## 2019-08-12 DIAGNOSIS — M6281 Muscle weakness (generalized): Secondary | ICD-10-CM

## 2019-08-12 DIAGNOSIS — M25532 Pain in left wrist: Secondary | ICD-10-CM

## 2019-08-12 DIAGNOSIS — M25571 Pain in right ankle and joints of right foot: Secondary | ICD-10-CM

## 2019-08-12 DIAGNOSIS — M25671 Stiffness of right ankle, not elsewhere classified: Secondary | ICD-10-CM

## 2019-08-12 DIAGNOSIS — M25642 Stiffness of left hand, not elsewhere classified: Secondary | ICD-10-CM

## 2019-08-12 NOTE — Therapy (Signed)
[facility] [address] [place], [address] Phone: [phone]   Fax:  [phone]  Occupational Therapy Treatment  Patient Details  Name: Joel Burgess MRN: [id number] Date of Birth: [birth year] Referring Provider (OT): Burney Gauze   Encounter Date: 08/12/2019  OT End of Session - 08/12/19 0900    Visit Number  6    Number of Visits  8    Date for OT Re-Evaluation  08/23/19    OT Start Time  0845    OT Stop Time  0930    OT Time Calculation (min)  45 min    Activity Tolerance  Patient tolerated treatment well    Behavior During Therapy  [facility] for tasks assessed/performed       Past Medical History:  Diagnosis Date  . ADHD (attention deficit hyperactivity disorder)   . Anxiety     Past Surgical History:  Procedure Laterality Date  . APPENDECTOMY    . CARPAL TUNNEL RELEASE Left 04/23/2019   Procedure: LEFT CARPAL TUNNEL RELEASE;  Surgeon: Charlotte Crumb, MD;  Location: [place];  Service: Orthopedics;  Laterality: Left;  . KNEE SURGERY Right   . OPEN REDUCTION INTERNAL FIXATION (ORIF) DISTAL RADIAL FRACTURE Left 04/23/2019   Procedure: OPEN REDUCTION INTERNAL FIXATION (ORIF) DISTAL RADIAL FRACTURE;  Surgeon: Charlotte Crumb, MD;  Location: [place];  Service: Orthopedics;  Laterality: Left;  AXILLARY BLOCK    There were no vitals filed for this visit.  Subjective Assessment - 08/12/19 0856    Subjective   I think it is better  that tendon pain and swelling - the pain inside still their- I think this splint helped  - splint helping- but my shoulder still bad    Patient Stated Goals  I want to be able to use my  hand and wrist like before - so I can go back and to my work    Currently in Pain?  Yes    Pain Score  5     Pain Location  Wrist    Pain Orientation  Left    Pain Descriptors / Indicators  Aching    Pain Type  Surgical pain    Pain Onset  1 to 4 weeks ago     Aggravating Factors   wrist flexion, ext, grip         [facility] OT Assessment - 08/12/19 0001      AROM   Left Wrist Extension  63 Degrees    Left Wrist Flexion  74 Degrees       Pt cont to show increase AROM - even with being this week in wrist splint to rest FCR- grip and prehension did decrease - but pain and swelling better   Still tender and some swelling  Pt report now more the normal pain- was able to feel pull over scar on volar wrist  with wrist extention onCPM      US done at end of session same as last time   OT Treatments/Exercises (OP) - 08/12/19 0001      Ultrasound   Ultrasound Location  volar radial distal wrist     Ultrasound Parameters  3.3MHZ, 20%; 0.8 intensity     Ultrasound Goals  Edema;Pain      LUE Paraffin   Number Minutes Paraffin  8 Minutes    LUE Paraffin Location  Hand;Wrist    Comments  SOc to icnrease ROM and decrease pain  CPM on for wrist flexion , extention 200 sec -each - feel this date stretch at correct place    Can do AAROM for wrist flexion , ext pain free range  Can cont with 2 lbs for RD,UD, sup/pro if pain free  Hold off on putty for gripping still  Cont with wrist prefab splint when using or moving wrist  Done some soft tissue mobs to scar and volar wrist and forearm -and xtra tape provided for scar mobs during day     OT Education - 08/12/19 0900    Education Details  wrist pain and tenderenss assess -and change HEP cont to wear splint and hold off on putty and wrist flexion ,ext with weight    Person(s) Educated  Patient    Methods  Explanation;Demonstration;Tactile cues;Verbal cues;Handout    Comprehension  Verbal cues required;Returned demonstration;Verbalized understanding       OT Short Term Goals - 07/26/19 1738      OT SHORT TERM GOAL #1   Title  Pt to be independing in HEP to increase digits flexion to touching palm and gripping tools    Baseline  MC's flexion 45-65 , PIP 80-85 - cannot grip anything  less than 2 inch grip per pt    Period  Weeks    Status  New    Target Date  08/09/19      OT SHORT TERM GOAL #2   Title  L supination/pronation improve to WNL to turn doorknob and get change in hand    Baseline  pronation 75 , sup 50 degrees    Time  3    Period  Weeks    Status  New    Target Date  08/16/19        OT Long Term Goals - 07/26/19 1740      OT LONG TERM GOAL #1   Title  L wrist AROM improve to [facility] to using hand in more than 60% of act on PRWHE    Baseline  wrist RD, ext, flexion decrease - function score on PRWHE using hand only in 3 % act    Time  4    Period  Weeks    Status  New    Target Date  08/23/19      OT LONG TERM GOAL #2   Title  L grip strength and prehension strength increase to more than 50% compare to R hand to carry and grip ADL's act of about 5 lbs    Baseline  Grip L 15, R 85 lbs, Lat grip R 28 ,L 8 lbs  ; 3 point grip R 19 , L 7 lbs    Time  4    Period  Weeks    Status  New    Target Date  08/23/19      OT LONG TERM GOAL #3   Title  PRWHE pain score improve with more than 25 point    Baseline  score on PRWHE for pain at eval 32/50    Time  4    Period  Weeks    Status  New    Target Date  08/23/19            Plan - 08/12/19 0901    Clinical Impression Statement  Pt about 16 wks s/p ORIF L distal radius fx - pt this date with less pain over FCR- swelling down but still present  - report he "feels still the pain inside " - pt  show increase wrist flexion and extention - even with being this week in wrist splint to rest FCR - pt cont to hold off  over weekend on flexion , ext with 2 lbs weight and gripping putty -will reassess Monday    OT Occupational Profile and History  Problem Focused Assessment - Including review of records relating to presenting problem    Occupational performance deficits (Please refer to evaluation for details):  ADL's;IADL's;Work;Play;Leisure;Social Participation    Body Structure / Function / Physical  Skills  ADL;ROM;UE functional use;Scar mobility;FMC;Dexterity;Pain;Strength;IADL    Rehab Potential  Good    Clinical Decision Making  Limited treatment options, no task modification necessary    Comorbidities Affecting Occupational Performance:  None    Modification or Assistance to Complete Evaluation   No modification of tasks or assist necessary to complete eval    OT Frequency  2x / week    OT Duration  2 weeks    OT Treatment/Interventions  Self-care/ADL training;Therapeutic exercise;Patient/family education;Splinting;Paraffin;Fluidtherapy;Contrast Bath;Manual Therapy;Passive range of motion;Scar mobilization    Plan  assess progress wiht pain and wearing of splint    OT Home Exercise Plan  see pt instruction    Consulted and Agree with Plan of Care  Patient       Patient will benefit from skilled therapeutic intervention in order to improve the following deficits and impairments:   Body Structure / Function / Physical Skills: ADL, ROM, UE functional use, Scar mobility, FMC, Dexterity, Pain, Strength, IADL       Visit Diagnosis: Stiffness of left hand, not elsewhere classified  Stiffness of left wrist, not elsewhere classified  Muscle weakness (generalized)  Stiffness of right ankle, not elsewhere classified  Pain in left wrist  Pain in right ankle and joints of right foot    Problem List Patient Active Problem List   Diagnosis Date Noted  . Attention deficit hyperactivity disorder (ADHD) 06/10/2019  . Pain from implanted hardware 09/15/2017  . Closed displaced fracture of lateral malleolus of right fibula 08/20/2016  . Closed displaced spiral fracture of shaft of right tibia 08/04/2016  . Anxiety, generalized 03/31/2015  . ADD (attention deficit disorder) 03/31/2015    Rosalyn Gess OTR/L,CLT 08/12/2019, 10:44 AM  [facility] [address] [place], [address] Phone: [phone]   Fax:   [phone]  Name: Joel Burgess MRN: [id number] Date of Birth: 07/14/1976

## 2019-08-12 NOTE — Patient Instructions (Signed)
Same as last time  

## 2019-08-16 ENCOUNTER — Ambulatory Visit: Payer: Self-pay | Admitting: Occupational Therapy

## 2019-08-18 ENCOUNTER — Ambulatory Visit: Payer: Self-pay

## 2019-08-18 ENCOUNTER — Ambulatory Visit: Payer: Self-pay | Admitting: Occupational Therapy

## 2019-08-18 ENCOUNTER — Other Ambulatory Visit: Payer: Self-pay

## 2019-08-18 DIAGNOSIS — M25612 Stiffness of left shoulder, not elsewhere classified: Secondary | ICD-10-CM

## 2019-08-18 DIAGNOSIS — M25632 Stiffness of left wrist, not elsewhere classified: Secondary | ICD-10-CM

## 2019-08-18 DIAGNOSIS — M62838 Other muscle spasm: Secondary | ICD-10-CM

## 2019-08-18 DIAGNOSIS — M25671 Stiffness of right ankle, not elsewhere classified: Secondary | ICD-10-CM

## 2019-08-18 DIAGNOSIS — M25642 Stiffness of left hand, not elsewhere classified: Secondary | ICD-10-CM

## 2019-08-18 DIAGNOSIS — M25532 Pain in left wrist: Secondary | ICD-10-CM

## 2019-08-18 DIAGNOSIS — M6281 Muscle weakness (generalized): Secondary | ICD-10-CM

## 2019-08-18 DIAGNOSIS — M25512 Pain in left shoulder: Secondary | ICD-10-CM
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
## 2019-08-18 NOTE — Patient Instructions (Signed)
Can cont with wrist PROM and tendon glides  Opposition  Add this date 1 lbs for wrist flexion , ext 2 x 12 pain free  And RD, UD, SUP, PRON 12 reps  X 2 with 2 lbs weight pain free   And then add teal putty 2 x 12 reps pain free for gripping  And teal for lat grip 2 x 12  2 x day

## 2019-08-18 NOTE — Therapy (Signed)
[place] [facility] [address], [place], [address] Phone: [phone]   Fax:  [phone]  Occupational Therapy Treatment  Patient Details  Name: Joel Burgess MRN: [id number] Date of Birth: [birth year] Referring Provider (OT): Burney Gauze   Encounter Date: 08/18/2019  OT End of Session - 08/18/19 1441    Visit Number  7    Number of Visits  8    Date for OT Re-Evaluation  08/23/19    OT Start Time  1350    OT Stop Time  1430    OT Time Calculation (min)  40 min    Activity Tolerance  Patient tolerated treatment well    Behavior During Therapy  [facility] for tasks assessed/performed       Past Medical History:  Diagnosis Date  . ADHD (attention deficit hyperactivity disorder)   . Anxiety     Past Surgical History:  Procedure Laterality Date  . APPENDECTOMY    . CARPAL TUNNEL RELEASE Left 04/23/2019   Procedure: LEFT CARPAL TUNNEL RELEASE;  Surgeon: Charlotte Crumb, MD;  Location: [place];  Service: Orthopedics;  Laterality: Left;  . KNEE SURGERY Right   . OPEN REDUCTION INTERNAL FIXATION (ORIF) DISTAL RADIAL FRACTURE Left 04/23/2019   Procedure: OPEN REDUCTION INTERNAL FIXATION (ORIF) DISTAL RADIAL FRACTURE;  Surgeon: Charlotte Crumb, MD;  Location: [place];  Service: Orthopedics;  Laterality: Left;  AXILLARY BLOCK    There were no vitals filed for this visit.  Subjective Assessment - 08/18/19 1438    Subjective   That pain from tendon still better- seen Dr yesterday - said the pain is the hardware - should get better- can cont with therapy and maybe look into returning back to work in 6 wks    Patient Stated Goals  I want to be able to use my  hand and wrist like before - so I can go back and to my work    Currently in Pain?  Yes    Pain Score  4     Pain Location  Wrist    Pain Orientation  Left    Pain Descriptors / Indicators  Aching    Pain Type  Surgical pain    Pain  Onset  More than a month ago         [facility] OT Assessment - 08/18/19 0001      AROM   Left Forearm Pronation  90 Degrees    Left Forearm Supination  80 Degrees    Right Wrist Extension  70 Degrees    Right Wrist Flexion  80 Degrees    Right Wrist Radial Deviation  28 Degrees    Right Wrist Ulnar Deviation  32 Degrees    Left Wrist Extension  65 Degrees    Left Wrist Flexion  74 Degrees    Left Wrist Radial Deviation  25 Degrees    Left Wrist Ulnar Deviation  34 Degrees      Strength   Right Hand Grip (lbs)  85    Right Hand Lateral Pinch  28 lbs    Right Hand 3 Point Pinch  19 lbs    Left Hand Grip (lbs)  41    Left Hand Lateral Pinch  20 lbs    Left Hand 3 Point Pinch  14 lbs       assess AROM for wrist and grip, prehension strength   wrist strength 4+/5 - with end range lacking in all  ranges  Grip decrease because of was having pt hold off on any putty because of irritation of FCR  Pt seen surgeon - report that it is now more hardware - should get better  No restrictions for therapy         OT Treatments/Exercises (OP) - 08/18/19 0001      LUE Paraffin   Number Minutes Paraffin  8 Minutes    LUE Paraffin Location  Hand;Wrist    Comments  wrist flexion , ext stretches 2 min each - prior to CPM       CPM for wrist flexion and ext 200 sec  Increase AROM afterwards  Review and upgrade HEP    Opposition  WNL  Add this date 1 lbs for wrist flexion , ext 2 x 12 pain free  And RD, UD, SUP, PRON 12 reps  X 2 with 2 lbs weight pain free   And then add teal putty 2 x 12 reps pain free for gripping  And teal for lat grip 2 x 12  2 x day       OT Education - 08/18/19 1441    Education Details  progress, findings from MD visit -and upgrade HEP    Person(s) Educated  Patient    Methods  Explanation;Demonstration;Tactile cues;Verbal cues;Handout    Comprehension  Verbal cues required;Returned demonstration;Verbalized understanding       OT Short Term Goals -  07/26/19 1738      OT SHORT TERM GOAL #1   Title  Pt to be independing in HEP to increase digits flexion to touching palm and gripping tools    Baseline  MC's flexion 45-65 , PIP 80-85 - cannot grip anything less than 2 inch grip per pt    Period  Weeks    Status  New    Target Date  08/09/19      OT SHORT TERM GOAL #2   Title  L supination/pronation improve to WNL to turn doorknob and get change in hand    Baseline  pronation 75 , sup 50 degrees    Time  3    Period  Weeks    Status  New    Target Date  08/16/19        OT Long Term Goals - 07/26/19 1740      OT LONG TERM GOAL #1   Title  L wrist AROM improve to [facility] to using hand in more than 60% of act on PRWHE    Baseline  wrist RD, ext, flexion decrease - function score on PRWHE using hand only in 3 % act    Time  4    Period  Weeks    Status  New    Target Date  08/23/19      OT LONG TERM GOAL #2   Title  L grip strength and prehension strength increase to more than 50% compare to R hand to carry and grip ADL's act of about 5 lbs    Baseline  Grip L 15, R 85 lbs, Lat grip R 28 ,L 8 lbs  ; 3 point grip R 19 , L 7 lbs    Time  4    Period  Weeks    Status  New    Target Date  08/23/19      OT LONG TERM GOAL #3   Title  PRWHE pain score improve with more than 25 point    Baseline  score on PRWHE for pain  at eval 32/50    Time  4    Period  Weeks    Status  New    Target Date  08/23/19            Plan - 08/18/19 1442    Clinical Impression Statement  Pt is about 17 wks s/p ORIF L distal radius fx - pt show decrease pain - seen surgeon that report more hardware pain - should get better- this date gradually started pt back with weight and putty for wrist and grip - pt to not over do it - cont to make progress in AROM for wrist    OT Occupational Profile and History  Problem Focused Assessment - Including review of records relating to presenting problem    Occupational performance deficits (Please refer to  evaluation for details):  ADL's;IADL's;Work;Play;Leisure;Social Participation    Body Structure / Function / Physical Skills  ADL;ROM;UE functional use;Scar mobility;FMC;Dexterity;Pain;Strength;IADL    Rehab Potential  Good    Clinical Decision Making  Limited treatment options, no task modification necessary    Comorbidities Affecting Occupational Performance:  None    Modification or Assistance to Complete Evaluation   No modification of tasks or assist necessary to complete eval    OT Frequency  2x / week    OT Duration  2 weeks    OT Treatment/Interventions  Self-care/ADL training;Therapeutic exercise;Patient/family education;Splinting;Paraffin;Fluidtherapy;Contrast Bath;Manual Therapy;Passive range of motion;Scar mobilization    Plan  assess tolerance with upgrade of HEP    OT Home Exercise Plan  see pt instruction    Consulted and Agree with Plan of Care  Patient       Patient will benefit from skilled therapeutic intervention in order to improve the following deficits and impairments:   Body Structure / Function / Physical Skills: ADL, ROM, UE functional use, Scar mobility, FMC, Dexterity, Pain, Strength, IADL       Visit Diagnosis: Stiffness of left hand, not elsewhere classified  Muscle weakness (generalized)  Stiffness of left wrist, not elsewhere classified  Stiffness of right ankle, not elsewhere classified  Pain in left wrist    Problem List Patient Active Problem List   Diagnosis Date Noted  . Attention deficit hyperactivity disorder (ADHD) 06/10/2019  . Pain from implanted hardware 09/15/2017  . Closed displaced fracture of lateral malleolus of right fibula 08/20/2016  . Closed displaced spiral fracture of shaft of right tibia 08/04/2016  . Anxiety, generalized 03/31/2015  . ADD (attention deficit disorder) 03/31/2015    Rosalyn Gess OTR/L,CLT 08/18/2019, 2:44 PM  [place] [facility] [address] [place], [address] Phone: [phone]   Fax:  [phone]  Name: Nirvaan Castiglia MRN: [id number] Date of Birth: November 13, 1976

## 2019-08-23 ENCOUNTER — Other Ambulatory Visit: Payer: Self-pay

## 2019-08-23 ENCOUNTER — Ambulatory Visit: Payer: Self-pay | Admitting: Occupational Therapy

## 2019-08-23 ENCOUNTER — Ambulatory Visit: Payer: Self-pay

## 2019-08-23 DIAGNOSIS — M6281 Muscle weakness (generalized): Secondary | ICD-10-CM

## 2019-08-23 DIAGNOSIS — M25632 Stiffness of left wrist, not elsewhere classified: Secondary | ICD-10-CM

## 2019-08-23 DIAGNOSIS — M25512 Pain in left shoulder: Secondary | ICD-10-CM

## 2019-08-23 DIAGNOSIS — M25532 Pain in left wrist: Secondary | ICD-10-CM

## 2019-08-23 DIAGNOSIS — M25612 Stiffness of left shoulder, not elsewhere classified: Secondary | ICD-10-CM

## 2019-08-23 DIAGNOSIS — M25642 Stiffness of left hand, not elsewhere classified: Secondary | ICD-10-CM

## 2019-08-23 NOTE — Therapy (Addendum)
[place] [facility] [address], [place], [address] Phone: [phone]   Fax:  [phone]  Physical Therapy Treatment  Patient Details  Name: Joel Burgess MRN: [id number] Date of Birth: 1976-08-29 Referring Provider (PT): Burney Gauze MD   Encounter Date: 08/23/2019  PT End of Session - 08/23/19 0919    Visit Number  2    Number of Visits  13    Date for PT Re-Evaluation  09/29/19    PT Start Time  0904    PT Stop Time  0945    PT Time Calculation (min)  41 min    Activity Tolerance  Patient tolerated treatment well    Behavior During Therapy  [facility] for tasks assessed/performed       Past Medical History:  Diagnosis Date  . ADHD (attention deficit hyperactivity disorder)   . Anxiety     Past Surgical History:  Procedure Laterality Date  . APPENDECTOMY    . CARPAL TUNNEL RELEASE Left 04/23/2019   Procedure: LEFT CARPAL TUNNEL RELEASE;  Surgeon: Charlotte Crumb, MD;  Location: [place];  Service: Orthopedics;  Laterality: Left;  . KNEE SURGERY Right   . OPEN REDUCTION INTERNAL FIXATION (ORIF) DISTAL RADIAL FRACTURE Left 04/23/2019   Procedure: OPEN REDUCTION INTERNAL FIXATION (ORIF) DISTAL RADIAL FRACTURE;  Surgeon: Charlotte Crumb, MD;  Location: [place];  Service: Orthopedics;  Laterality: Left;  AXILLARY BLOCK    There were no vitals filed for this visit.  Subjective Assessment - 08/23/19 0910    Subjective  Patient reports his shoulder is feeling less pain overall. However, he continues to have increased pain with reaching behind his back and head.    Pertinent History  Increased ankle pain after ORIF; currently being seen for OT for wrist and hand    Limitations  Lifting    Patient Stated Goals  To improve shoulder flexion    Currently in Pain?  No/denies    Pain Onset  More than a month ago       TREATMENT   Therapeutic Exercise to address and improve his  shoulder mobility, strength, and activity tolerance with mobility.    Shoulder ER with shoulder at 90 degrees - x 20 with 2# Bicep curls in standing - x 30 3# Shoulder ER with hand on top of head -- 2x 20 Behind the back shoulder IR with use of towel. - x 10 with 10 sec holds Resisted shoulder IR behind his back -- x 20  Chest flys in standing with use of BTB - 2 x 15   At the end of the session, pt reported moderate muscle soreness      PT Education - 08/23/19 0918    Education Details  form/technique with exercise    Person(s) Educated  Patient    Methods  Explanation;Demonstration    Comprehension  Verbalized understanding;Returned demonstration          PT Long Term Goals - 08/18/19 1604      PT LONG TERM GOAL #1   Title  Pt will be independent with HEP in order to improve strength and balance in order to  improve function at home and work.     Baseline  Dependent with exercise    Time  6    Period  Weeks    Status  New      PT LONG TERM GOAL #2   Title  Patient will be able to perform  shoulder flexion throuhgout full AROM without any increase in pain to return to occupational related tasks.    Baseline  Painful Arc between 100 and 150 degrees    Time  6    Period  Weeks    Status  New      PT LONG TERM GOAL #3   Title  Patient will improve QuickDASH to under 10% to indicate signficant improvement in pain and spasms and UE function to better be able to perform work related tasks.    Baseline  70% QuickDASH    Time  6    Period  Weeks    Status  New      PT LONG TERM GOAL #4   Title  Patient will score equally on his Apley Scratch test Bilaterally in each direction behind the back and behind the head to better be able to clean his back.    Baseline  L shoulder: Behind the back: L3, Behind the head: T1; R shoulder: Behind the back: T6, Behind the head T6    Time  6    Period  Weeks    Status  New            Plan - 08/23/19 0931    Clinical Impression  Statement  Focused on improving shoulder ER/IR today to improve reaching and lifting. Patient demosntrates les pain overall after performing AROM interventions and is able to perform exercises throughout a greater AROOM compared to previous sessions. Patient's AROM is improving, however he continues to have strength deficits limiting shoulder function. Patient will benefit from furhter skilled therapy to return to prior level of function.    Personal Factors and Comorbidities  Comorbidity 2    Comorbidities  wrist/hand pain    Examination-Activity Limitations  Lift    Examination-Participation Restrictions  Cleaning    Stability/Clinical Decision Making  Stable/Uncomplicated    Rehab Potential  Good    PT Frequency  2x / week    PT Duration  6 weeks    PT Treatment/Interventions  Cryotherapy;Electrical Stimulation;Moist Heat;Functional mobility training;Therapeutic activities;Therapeutic exercise;Neuromuscular re-education;Patient/family education;Manual techniques;Dry needling;Passive range of motion;Spinal Manipulations;Joint Manipulations    PT Next Visit Plan  Progress strenghtening and coordination    PT Home Exercise Plan  See education section       Patient will benefit from skilled therapeutic intervention in order to improve the following deficits and impairments:  Decreased mobility, Increased muscle spasms, Hypomobility, Decreased range of motion, Decreased coordination, Decreased strength, Pain  Visit Diagnosis: Acute pain of left shoulder  Stiffness of left shoulder, not elsewhere classified     Problem List Patient Active Problem List   Diagnosis Date Noted  . Attention deficit hyperactivity disorder (ADHD) 06/10/2019  . Pain from implanted hardware 09/15/2017  . Closed displaced fracture of lateral malleolus of right fibula 08/20/2016  . Closed displaced spiral fracture of shaft of right tibia 08/04/2016  . Anxiety, generalized 03/31/2015  . ADD (attention deficit  disorder) 03/31/2015    Blythe Stanford, PT DPT 08/23/2019, 9:53 AM  [place] [facility] [address], [place], [address] Phone: [phone]   Fax:  [phone]  Name: Joel Burgess MRN: [id number] Date of Birth: [birth year]

## 2019-08-23 NOTE — Therapy (Signed)
[facility] [address] [place], [address] Phone: [phone]   Fax:  [phone]  Occupational Therapy Treatment  Patient Details  Name: Joel Burgess MRN: [id number] Date of Birth: 24-May-1976 Referring Provider (OT): Burney Gauze   Encounter Date: 08/23/2019  OT End of Session - 08/23/19 1043    Visit Number  8    Number of Visits  14    Date for OT Re-Evaluation  09/21/19    OT Start Time  0947    OT Stop Time  1030    OT Time Calculation (min)  43 min    Activity Tolerance  Patient tolerated treatment well    Behavior During Therapy  [facility] for tasks assessed/performed       Past Medical History:  Diagnosis Date  . ADHD (attention deficit hyperactivity disorder)   . Anxiety     Past Surgical History:  Procedure Laterality Date  . APPENDECTOMY    . CARPAL TUNNEL RELEASE Left 04/23/2019   Procedure: LEFT CARPAL TUNNEL RELEASE;  Surgeon: Charlotte Crumb, MD;  Location: [facility];  Service: Orthopedics;  Laterality: Left;  . KNEE SURGERY Right   . OPEN REDUCTION INTERNAL FIXATION (ORIF) DISTAL RADIAL FRACTURE Left 04/23/2019   Procedure: OPEN REDUCTION INTERNAL FIXATION (ORIF) DISTAL RADIAL FRACTURE;  Surgeon: Charlotte Crumb, MD;  Location: [facility];  Service: Orthopedics;  Laterality: Left;  AXILLARY BLOCK    There were no vitals filed for this visit.  Subjective Assessment - 08/23/19 1040    Subjective   I did okay - that pain spot was not there - more in the middle of wrist- but Dr said it will get better in 6-12 months - done putty and weight more on time than reps    Patient Stated Goals  I want to be able to use my  hand and wrist like before - so I can go back and to my work    Currently in Pain?  Yes    Pain Score  2     Pain Location  Wrist    Pain Orientation  Left    Pain Descriptors / Indicators  Aching    Pain Type  Surgical pain    Pain Onset  More  than a month ago         [facility] OT Assessment - 08/23/19 0001      Strength   Right Hand Grip (lbs)  85    Right Hand Lateral Pinch  28 lbs    Right Hand 3 Point Pinch  19 lbs    Left Hand Grip (lbs)  56    Left Hand Lateral Pinch  20 lbs    Left Hand 3 Point Pinch  14 lbs       great progress in grip strength , and wrist flexion , sup , pronation improved  Tolerated 2 lbs weight and teal putty well  This past few days with HEP         OT Treatments/Exercises (OP) - 08/23/19 0001      LUE Paraffin   Number Minutes Paraffin  8 Minutes    LUE Paraffin Location  Wrist    Comments  wrist flexion stretch 2 x during it       scar massage check and done - and use graston tool nr 2 for brushing and sweeping   Dexterity assess -and add to HEP - small 1 cm objects - finger tips <>  palm , 5 objects , use hand as storage - while retrieve and out of hand Unable to do 1-2 cm objects palm to 4th/5th digit - pt to use golf ball or chinese ball   alternate thumb to 2nd/3rd and then palm , 4th/5th , palm -    Upgrade putty to green for gripping, lat and 3 point grip , pulling like taffy all digits  But decrease to 15 reps  And not time  Unable to do twisting with all digits using green - pt to cont to do with  teal for twisting 15 reps   Cont 2 lbs weight for wrist in all planes - but can do little more but pain free  Cont with flexion , ext, pron, sup stretch        OT Education - 08/23/19 1043    Education Details  progress and upgrade of HEP    Person(s) Educated  Patient    Methods  Explanation;Demonstration;Tactile cues;Verbal cues;Handout    Comprehension  Verbal cues required;Returned demonstration;Verbalized understanding       OT Short Term Goals - 08/23/19 1046      OT SHORT TERM GOAL #1   Title  Pt to be independing in HEP to increase digits flexion to touching palm and gripping tools    Status  Achieved      OT SHORT TERM GOAL #2   Title  L  supination/pronation improve to WNL to turn doorknob and get change in hand    Baseline  pro, sup 90 and strength improving - but cannot get change out of hand , opposition to all digits at same time - decrease strength with twisting knob ,    Time  3    Period  Weeks    Status  On-going    Target Date  09/13/19        OT Long Term Goals - 08/23/19 1047      OT LONG TERM GOAL #1   Title  L wrist AROM improve to [facility] to using hand in more than 60% of act on PRWHE    Status  Achieved      OT LONG TERM GOAL #2   Title  L grip strength and prehension strength increase to more than 50% compare to R hand to carry and grip ADL's act of about 5 lbs    Baseline  AT EVAL , Grip L 15, R 85 lbs, Lat grip R 28 ,L 8 lbs  ; 3 point grip R 19 , L 7 lbs; NOW Grip 56lbsL, R 85, L 20 lbs and R point 10 lbs- carry 4 lbs without discomfort    Time  4    Period  Weeks    Status  On-going    Target Date  09/20/19      OT LONG TERM GOAL #3   Title  PRWHE pain score improve with more than 25 point    Baseline  score on PRWHE for pain at eval 32/50 at [facility] ; and now 12/50    Time  4    Period  Weeks    Status  On-going    Target Date  09/20/19            Plan - 08/23/19 1044    Clinical Impression Statement  Pt is about 18 wks s/p ORIF L distal radius - making great progress in AROM of wrist and strength- as well as able to upgrade his putty HEP- and add  this date also some dexterity to HEP    OT Occupational Profile and History  Problem Focused Assessment - Including review of records relating to presenting problem    Occupational performance deficits (Please refer to evaluation for details):  ADL's;IADL's;Work;Play;Leisure;Social Participation    Body Structure / Function / Physical Skills  ADL;ROM;UE functional use;Scar mobility;FMC;Dexterity;Pain;Strength;IADL    Rehab Potential  Good    Clinical Decision Making  Limited treatment options, no task modification necessary    Comorbidities  Affecting Occupational Performance:  None    Modification or Assistance to Complete Evaluation   No modification of tasks or assist necessary to complete eval    OT Frequency  --   2 x wk for 2 wks , 1 x wk for 2 wks   OT Duration  --   4 wks   OT Treatment/Interventions  Self-care/ADL training;Therapeutic exercise;Patient/family education;Splinting;Paraffin;Fluidtherapy;Contrast Bath;Manual Therapy;Passive range of motion;Scar mobilization    Plan  tolerance to putty upgrade and 2 lbs weight    OT Home Exercise Plan  see pt instruction    Consulted and Agree with Plan of Care  Patient       Patient will benefit from skilled therapeutic intervention in order to improve the following deficits and impairments:   Body Structure / Function / Physical Skills: ADL, ROM, UE functional use, Scar mobility, FMC, Dexterity, Pain, Strength, IADL       Visit Diagnosis: Acute pain of left shoulder - Plan: Ot plan of care cert/re-cert  Stiffness of left hand, not elsewhere classified - Plan: Ot plan of care cert/re-cert  Muscle weakness (generalized) - Plan: Ot plan of care cert/re-cert  Stiffness of left wrist, not elsewhere classified - Plan: Ot plan of care cert/re-cert  Pain in left wrist - Plan: Ot plan of care cert/re-cert    Problem List Patient Active Problem List   Diagnosis Date Noted  . Attention deficit hyperactivity disorder (ADHD) 06/10/2019  . Pain from implanted hardware 09/15/2017  . Closed displaced fracture of lateral malleolus of right fibula 08/20/2016  . Closed displaced spiral fracture of shaft of right tibia 08/04/2016  . Anxiety, generalized 03/31/2015  . ADD (attention deficit disorder) 03/31/2015    Rosalyn Gess OTR/L,CLT 08/23/2019, 10:52 AM  [facility] [address] [place], [address] Phone: [phone]   Fax:  [phone]  Name: Joel Burgess MRN: [id number] Date of Birth:  1976-08-05

## 2019-08-23 NOTE — Patient Instructions (Signed)
Upgrade putty to green for gripping, lat and 3 point grip , pulling like taffy all digits  But decrease to 15 reps  And teal for twisting 15 reps   Cont 2 lbs weight for wrist in all planes - but can do little more but pain free  Cont with flexion , ext, pron, sup stretch

## 2019-08-25 ENCOUNTER — Ambulatory Visit: Payer: Self-pay | Attending: Orthopedic Surgery | Admitting: Occupational Therapy

## 2019-08-25 ENCOUNTER — Ambulatory Visit: Payer: Self-pay

## 2019-08-25 ENCOUNTER — Other Ambulatory Visit: Payer: Self-pay

## 2019-08-25 DIAGNOSIS — M25512 Pain in left shoulder: Secondary | ICD-10-CM

## 2019-08-25 DIAGNOSIS — M6281 Muscle weakness (generalized): Secondary | ICD-10-CM | POA: Insufficient documentation

## 2019-08-25 DIAGNOSIS — M25612 Stiffness of left shoulder, not elsewhere classified: Secondary | ICD-10-CM

## 2019-08-25 DIAGNOSIS — M25632 Stiffness of left wrist, not elsewhere classified: Secondary | ICD-10-CM | POA: Insufficient documentation

## 2019-08-25 DIAGNOSIS — M25532 Pain in left wrist: Secondary | ICD-10-CM | POA: Insufficient documentation

## 2019-08-25 DIAGNOSIS — M25642 Stiffness of left hand, not elsewhere classified: Secondary | ICD-10-CM | POA: Insufficient documentation

## 2019-08-25 NOTE — Therapy (Signed)
[facility] [address], [place], [address] Phone: [phone]   Fax:  [phone]  Occupational Therapy Treatment  Patient Details  Name: Joel Burgess MRN: [id number] Date of Birth: May 13, 1976 Referring Provider (OT): Burney Gauze   Encounter Date: 08/25/2019  OT End of Session - 08/25/19 1246    Visit Number  9    Number of Visits  14    Date for OT Re-Evaluation  09/21/19    OT Start Time  1231    OT Stop Time  1305    OT Time Calculation (min)  34 min    Activity Tolerance  Patient tolerated treatment well    Behavior During Therapy  [facility] for tasks assessed/performed       Past Medical History:  Diagnosis Date  . ADHD (attention deficit hyperactivity disorder)   . Anxiety     Past Surgical History:  Procedure Laterality Date  . APPENDECTOMY    . CARPAL TUNNEL RELEASE Left 04/23/2019   Procedure: LEFT CARPAL TUNNEL RELEASE;  Surgeon: Charlotte Crumb, MD;  Location: [place];  Service: Orthopedics;  Laterality: Left;  . KNEE SURGERY Right   . OPEN REDUCTION INTERNAL FIXATION (ORIF) DISTAL RADIAL FRACTURE Left 04/23/2019   Procedure: OPEN REDUCTION INTERNAL FIXATION (ORIF) DISTAL RADIAL FRACTURE;  Surgeon: Charlotte Crumb, MD;  Location: [facility];  Service: Orthopedics;  Laterality: Left;  AXILLARY BLOCK    There were no vitals filed for this visit.  Subjective Assessment - 08/25/19 1244    Subjective   I done the fine motor - using grape - that is smaller - and more challenging- pain not more than 3/10 - putty and 2 lbs was fine - no issues    Patient Stated Goals  I want to be able to use my  hand and wrist like before - so I can go back and to my work    Currently in Pain?  Yes    Pain Score  3     Pain Location  Wrist    Pain Orientation  Left    Pain Descriptors / Indicators  Aching    Pain Type  Surgical pain    Pain Onset  More than a month ago          [facility] OT Assessment - 08/25/19 0001      AROM   Left Wrist Extension  67 Degrees    Left Wrist Flexion  75 Degrees      Strength   Right Hand Grip (lbs)  85    Right Hand Lateral Pinch  28 lbs    Right Hand 3 Point Pinch  19 lbs    Left Hand Grip (lbs)  64    Left Hand Lateral Pinch  22 lbs    Left Hand 3 Point Pinch  18 lbs       pt cont to show improvement every time he comes in - increase [facility] this date with 1-2 cm objects from fingers <> palm , and palm to 2nd , 3rd and palm to 4th and 5th   Grip and prehension improve again this date  No issues with 2 lbs weight  Cont to have some pain on volar wrist with wrist extention -        OT Treatments/Exercises (OP) - 08/25/19 0001      LUE Paraffin   Number Minutes Paraffin  8 Minutes    LUE Paraffin Location  Wrist    Comments  intrinsic fist - stretch         Putty review with pt - able to do green with twisting too-  to cont with same HEP but can do all 5 exercises with medium green  And can increase to 2-3 sets in the next 6 days - and if no issue - can add dark blue to green to increase resistance   Upgrade  And done in clinic this date RD, UD, sup ,pron  to 3 lbs  15 reps  Pain free for both  Can increase to 2 and 3 sets pain free in the next 6 days   and cont with 2 lbs for wrist flexion , extention        OT Education - 08/25/19 1246    Education Details  progress and upgrade of HEP    Person(s) Educated  Patient    Methods  Explanation;Demonstration;Tactile cues;Verbal cues;Handout    Comprehension  Verbal cues required;Returned demonstration;Verbalized understanding       OT Short Term Goals - 08/23/19 1046      OT SHORT TERM GOAL #1   Title  Pt to be independing in HEP to increase digits flexion to touching palm and gripping tools    Status  Achieved      OT SHORT TERM GOAL #2   Title  L supination/pronation improve to WNL to turn doorknob and get change in hand    Baseline  pro, sup  90 and strength improving - but cannot get change out of hand , opposition to all digits at same time - decrease strength with twisting knob ,    Time  3    Period  Weeks    Status  On-going    Target Date  09/13/19        OT Long Term Goals - 08/23/19 1047      OT LONG TERM GOAL #1   Title  L wrist AROM improve to [facility] to using hand in more than 60% of act on PRWHE    Status  Achieved      OT LONG TERM GOAL #2   Title  L grip strength and prehension strength increase to more than 50% compare to R hand to carry and grip ADL's act of about 5 lbs    Baseline  AT EVAL , Grip L 15, R 85 lbs, Lat grip R 28 ,L 8 lbs  ; 3 point grip R 19 , L 7 lbs; NOW Grip 56lbsL, R 85, L 20 lbs and R point 10 lbs- carry 4 lbs without discomfort    Time  4    Period  Weeks    Status  On-going    Target Date  09/20/19      OT LONG TERM GOAL #3   Title  PRWHE pain score improve with more than 25 point    Baseline  score on PRWHE for pain at eval 32/50 at [facility] ; and now 12/50    Time  4    Period  Weeks    Status  On-going    Target Date  09/20/19            Plan - 08/25/19 1246    Clinical Impression Statement  Pt is more than 4 months s/p ORIF L distal radius fx - making great progress in AROM for wrist and digits- work this date on intrinsic fist - and putty able to do all HEP with  med firm , and upgrade to 3 lbs for RD, UD, sup , pron - cont 2 lbs for wrist flexion , ext    OT Occupational Profile and History  Problem Focused Assessment - Including review of records relating to presenting problem    Occupational performance deficits (Please refer to evaluation for details):  ADL's;IADL's;Work;Play;Leisure;Social Participation    Body Structure / Function / Physical Skills  ADL;ROM;UE functional use;Scar mobility;FMC;Dexterity;Pain;Strength;IADL    Rehab Potential  Good    Clinical Decision Making  Limited treatment options, no task modification necessary    Comorbidities Affecting Occupational  Performance:  None    Modification or Assistance to Complete Evaluation   No modification of tasks or assist necessary to complete eval    OT Frequency  1x / week    OT Duration  4 weeks    OT Treatment/Interventions  Self-care/ADL training;Therapeutic exercise;Patient/family education;Splinting;Paraffin;Fluidtherapy;Contrast Bath;Manual Therapy;Passive range of motion;Scar mobilization    Plan  tolerance to putty upgrade and 3 lbs weight    OT Home Exercise Plan  see pt instruction    Consulted and Agree with Plan of Care  Patient       Patient will benefit from skilled therapeutic intervention in order to improve the following deficits and impairments:   Body Structure / Function / Physical Skills: ADL, ROM, UE functional use, Scar mobility, FMC, Dexterity, Pain, Strength, IADL       Visit Diagnosis: Muscle weakness (generalized)  Stiffness of left wrist, not elsewhere classified  Pain in left wrist  Stiffness of left hand, not elsewhere classified    Problem List Patient Active Problem List   Diagnosis Date Noted  . Attention deficit hyperactivity disorder (ADHD) 06/10/2019  . Pain from implanted hardware 09/15/2017  . Closed displaced fracture of lateral malleolus of right fibula 08/20/2016  . Closed displaced spiral fracture of shaft of right tibia 08/04/2016  . Anxiety, generalized 03/31/2015  . ADD (attention deficit disorder) 03/31/2015    Rosalyn Gess OTR/L,CLT 08/25/2019, 1:54 PM  [facility] [address] [place], [address] Phone: [phone]   Fax:  [phone]  Name: Trase Traugott MRN: [id number] Date of Birth: 11-Jun-1976

## 2019-08-25 NOTE — Patient Instructions (Addendum)
Putty to cont with same HEP but can do all 5 exercises with medium green  And can increase to 2-3 sets in the next 6 days - and if no issue - can add dark blue to green to increase resistance   Upgrade RD, UD to 3 lbs and sup/ pro - 15 reps  Pain free for both  Can increase to 2 and 3 sets pain free in the next 6 days

## 2019-08-25 NOTE — Therapy (Signed)
[facility] [address], [place], [address] Phone: [phone]   Fax:  [phone]  Physical Therapy Treatment  Patient Details  Name: Joel Burgess MRN: [id number] Date of Birth: 22-Apr-1976 Referring Provider (PT): Burney Gauze MD   Encounter Date: 08/25/2019  PT End of Session - 08/25/19 1331    Visit Number  3    Number of Visits  13    Date for PT Re-Evaluation  09/29/19    PT Start Time  [id number]    PT Stop Time  1345    PT Time Calculation (min)  42 min    Activity Tolerance  Patient tolerated treatment well    Behavior During Therapy  [facility] for tasks assessed/performed       Past Medical History:  Diagnosis Date  . ADHD (attention deficit hyperactivity disorder)   . Anxiety     Past Surgical History:  Procedure Laterality Date  . APPENDECTOMY    . CARPAL TUNNEL RELEASE Left 04/23/2019   Procedure: LEFT CARPAL TUNNEL RELEASE;  Surgeon: Charlotte Crumb, MD;  Location: [facility];  Service: Orthopedics;  Laterality: Left;  . KNEE SURGERY Right   . OPEN REDUCTION INTERNAL FIXATION (ORIF) DISTAL RADIAL FRACTURE Left 04/23/2019   Procedure: OPEN REDUCTION INTERNAL FIXATION (ORIF) DISTAL RADIAL FRACTURE;  Surgeon: Charlotte Crumb, MD;  Location: [facility];  Service: Orthopedics;  Laterality: Left;  AXILLARY BLOCK    There were no vitals filed for this visit.  Subjective Assessment - 08/25/19 1323    Subjective  Patient states his shoulder continues to improve but has difficulty with sleeping at night. He states difficult with reaching behind his back.    Pertinent History  Increased ankle pain after ORIF; currently being seen for OT for wrist and hand    Limitations  Lifting    Patient Stated Goals  To improve shoulder flexion    Currently in Pain?  No/denies    Pain Onset  More than a month ago         TREATMENT   Therapeutic Exercise to address and improve his  shoulder mobility, strength, and activity tolerance with mobility.   Corner stretch in standing - x 10 5 sec  Doorway stretch - x 10 5 sec Chest flys in supine with 3# -- x 30  Shoulder ER with shoulder at 90 degrees - x 20 Shoulder extension in doorway - x 20 Bicep curls in standing - x 30 3# Military press - x15 3# Behind the back shoulder IR with use of towel. - x 3 Chest flys in standing with use of BTB - 2 x 15   At the end of the session, pt reported moderate muscle soreness   PT Education - 08/25/19 1330    Education Details  form/technique with exercise    Person(s) Educated  Patient    Methods  Explanation;Demonstration    Comprehension  Verbalized understanding;Returned demonstration          PT Long Term Goals - 08/18/19 1604      PT LONG TERM GOAL #1   Title  Pt will be independent with HEP in order to improve strength and balance in order to  improve function at home and work.     Baseline  Dependent with exercise    Time  6    Period  Weeks    Status  New      PT LONG TERM GOAL #2  Title  Patient will be able to perform shoulder flexion throuhgout full AROM without any increase in pain to return to occupational related tasks.    Baseline  Painful Arc between 100 and 150 degrees    Time  6    Period  Weeks    Status  New      PT LONG TERM GOAL #3   Title  Patient will improve QuickDASH to under 10% to indicate signficant improvement in pain and spasms and UE function to better be able to perform work related tasks.    Baseline  70% QuickDASH    Time  6    Period  Weeks    Status  New      PT LONG TERM GOAL #4   Title  Patient will score equally on his Apley Scratch test Bilaterally in each direction behind the back and behind the head to better be able to clean his back.    Baseline  L shoulder: Behind the back: L3, Behind the head: T1; R shoulder: Behind the back: T6, Behind the head T6    Time  6    Period  Weeks    Status  New             Plan - 08/25/19 1331    Clinical Impression Statement  Patient demonstrates overall improvement with both ER/IR compared to previous sessions. Patient demonstrates greater decreased in shoulder IR vs ER but continues to demosntrate improvement overall. Unable to perform signifcant strengthening within the L UE secondary to wrist limitations and will benefit from further skilled therapy focused on strengthening and improving rotational range of motion to decrease pain and spasms.    Personal Factors and Comorbidities  Comorbidity 2    Comorbidities  wrist/hand pain    Examination-Activity Limitations  Lift    Examination-Participation Restrictions  Cleaning    Stability/Clinical Decision Making  Stable/Uncomplicated    Rehab Potential  Good    PT Frequency  2x / week    PT Duration  6 weeks    PT Treatment/Interventions  Cryotherapy;Electrical Stimulation;Moist Heat;Functional mobility training;Therapeutic activities;Therapeutic exercise;Neuromuscular re-education;Patient/family education;Manual techniques;Dry needling;Passive range of motion;Spinal Manipulations;Joint Manipulations    PT Next Visit Plan  Progress strenghtening and coordination    PT Home Exercise Plan  See education section       Patient will benefit from skilled therapeutic intervention in order to improve the following deficits and impairments:  Decreased mobility, Increased muscle spasms, Hypomobility, Decreased range of motion, Decreased coordination, Decreased strength, Pain  Visit Diagnosis: Muscle weakness (generalized)  Stiffness of left shoulder, not elsewhere classified  Acute pain of left shoulder     Problem List Patient Active Problem List   Diagnosis Date Noted  . Attention deficit hyperactivity disorder (ADHD) 06/10/2019  . Pain from implanted hardware 09/15/2017  . Closed displaced fracture of lateral malleolus of right fibula 08/20/2016  . Closed displaced spiral fracture of shaft of  right tibia 08/04/2016  . Anxiety, generalized 03/31/2015  . ADD (attention deficit disorder) 03/31/2015    Joel Burgess, PT DPT 08/25/2019, 1:52 PM  [facility] [address] [place], [address] Phone: [phone]   Fax:  [phone]  Name: Joel Burgess MRN: [id number] Date of Birth: 04/18/1976

## 2019-08-31 ENCOUNTER — Encounter: Payer: Self-pay | Admitting: Psychiatry

## 2019-08-31 ENCOUNTER — Ambulatory Visit (INDEPENDENT_AMBULATORY_CARE_PROVIDER_SITE_OTHER): Payer: Self-pay | Admitting: Psychiatry

## 2019-08-31 ENCOUNTER — Ambulatory Visit: Payer: Self-pay | Admitting: Occupational Therapy

## 2019-08-31 ENCOUNTER — Other Ambulatory Visit: Payer: Self-pay

## 2019-08-31 ENCOUNTER — Ambulatory Visit: Payer: Self-pay

## 2019-08-31 DIAGNOSIS — M6281 Muscle weakness (generalized): Secondary | ICD-10-CM

## 2019-08-31 DIAGNOSIS — F902 Attention-deficit hyperactivity disorder, combined type: Secondary | ICD-10-CM

## 2019-08-31 DIAGNOSIS — F411 Generalized anxiety disorder: Secondary | ICD-10-CM

## 2019-08-31 DIAGNOSIS — M25612 Stiffness of left shoulder, not elsewhere classified: Secondary | ICD-10-CM

## 2019-08-31 DIAGNOSIS — M25512 Pain in left shoulder: Secondary | ICD-10-CM

## 2019-08-31 MED ORDER — ALPRAZOLAM 1 MG PO TABS: 1.0000 mg | ORAL_TABLET | Freq: Two times a day (BID) | ORAL | 2 refills | Status: DC

## 2019-08-31 MED ORDER — AMPHETAMINE-DEXTROAMPHETAMINE 30 MG PO TABS: 30.0000 mg | ORAL_TABLET | Freq: Two times a day (BID) | ORAL | 0 refills | Status: DC

## 2019-08-31 MED ORDER — DIAZEPAM 10 MG PO TABS: 10.0000 mg | ORAL_TABLET | Freq: Every evening | ORAL | 3 refills | Status: DC | PRN

## 2019-08-31 NOTE — Therapy (Signed)
[facility] [address], [place], [address] Phone: [phone]   Fax:  [phone]  Physical Therapy Treatment  Patient Details  Name: Joel Burgess MRN: [id number] Date of Birth: [birth year] Referring Provider (PT): Burney Gauze MD   Encounter Date: 08/31/2019  PT End of Session - 08/31/19 1442    Visit Number  4    Number of Visits  13    Date for PT Re-Evaluation  09/29/19    PT Start Time  [id number]    PT Stop Time  1430    PT Time Calculation (min)  45 min    Activity Tolerance  Patient tolerated treatment well    Behavior During Therapy  [facility] for tasks assessed/performed       Past Medical History:  Diagnosis Date  . ADHD (attention deficit hyperactivity disorder)   . Anxiety     Past Surgical History:  Procedure Laterality Date  . APPENDECTOMY    . CARPAL TUNNEL RELEASE Left 04/23/2019   Procedure: LEFT CARPAL TUNNEL RELEASE;  Surgeon: Charlotte Crumb, MD;  Location: [place];  Service: Orthopedics;  Laterality: Left;  . KNEE SURGERY Right   . OPEN REDUCTION INTERNAL FIXATION (ORIF) DISTAL RADIAL FRACTURE Left 04/23/2019   Procedure: OPEN REDUCTION INTERNAL FIXATION (ORIF) DISTAL RADIAL FRACTURE;  Surgeon: Charlotte Crumb, MD;  Location: [place];  Service: Orthopedics;  Laterality: Left;  AXILLARY BLOCK    There were no vitals filed for this visit.  Subjective Assessment - 08/31/19 1439    Subjective  Patient states improvement in strength and AROM along the shoulder put reports he continues to have pain with reaching underneath an object.    Pertinent History  Increased ankle pain after ORIF; currently being seen for OT for wrist and hand    Limitations  Lifting    Patient Stated Goals  To improve shoulder flexion    Currently in Pain?  No/denies    Pain Onset  More than a month ago       TREATMENT   Therapeutic Exercise to address and improve his shoulder  mobility, strength, and activity tolerance with mobility.   Shoulder ER with shoulder at 90 degrees - x 20 with 3# weight in supine Shoulder AROM horizontal adduction/abduction with rotating between supination/pronation Contract-relax against therapist support - x 20  Shoulder Flexion - 2 x15 10# Tricep overhead with 10# -- x 20  Performed exercises to decrease pain and improving strength in standing   Manual Therapy STM performed to coracobrachialis to decrease pain and spasms with patient positioned in supine to decrease pain and spasms  At the end of the session, pt reported moderate muscle soreness    PT Education - 08/31/19 1441    Education Details  form/technique with exercise    Person(s) Educated  Patient    Methods  Explanation;Demonstration    Comprehension  Verbalized understanding;Returned demonstration          PT Long Term Goals - 08/18/19 1604      PT LONG TERM GOAL #1   Title  Pt will be independent with HEP in order to improve strength and balance in order to  improve function at home and work.     Baseline  Dependent with exercise    Time  6    Period  Weeks    Status  New      PT LONG TERM GOAL #2   Title  Patient will  be able to perform shoulder flexion throuhgout full AROM without any increase in pain to return to occupational related tasks.    Baseline  Painful Arc between 100 and 150 degrees    Time  6    Period  Weeks    Status  New      PT LONG TERM GOAL #3   Title  Patient will improve QuickDASH to under 10% to indicate signficant improvement in pain and spasms and UE function to better be able to perform work related tasks.    Baseline  70% QuickDASH    Time  6    Period  Weeks    Status  New      PT LONG TERM GOAL #4   Title  Patient will score equally on his Apley Scratch test Bilaterally in each direction behind the back and behind the head to better be able to clean his back.    Baseline  L shoulder: Behind the back: L3, Behind the  head: T1; R shoulder: Behind the back: T6, Behind the head T6    Time  6    Period  Weeks    Status  New            Plan - 08/31/19 1450    Clinical Impression Statement  Patient demonstrates improvement with shoulder ER and IR compared to previous sessions. Patient continues to have limitations with End range ER at 90 degrees indicating poor mobility and increased muscular spasms along the supraspinatus and infraspinatus. Patient has deficits in terms of shoulder mobility and strength. Patient will benefit from further skilled therapy to return to prior level of function.    Personal Factors and Comorbidities  Comorbidity 2    Comorbidities  wrist/hand pain    Examination-Activity Limitations  Lift    Examination-Participation Restrictions  Cleaning    Stability/Clinical Decision Making  Stable/Uncomplicated    Rehab Potential  Good    PT Frequency  2x / week    PT Duration  6 weeks    PT Treatment/Interventions  Cryotherapy;Electrical Stimulation;Moist Heat;Functional mobility training;Therapeutic activities;Therapeutic exercise;Neuromuscular re-education;Patient/family education;Manual techniques;Dry needling;Passive range of motion;Spinal Manipulations;Joint Manipulations    PT Next Visit Plan  Progress strenghtening and coordination    PT Home Exercise Plan  See education section       Patient will benefit from skilled therapeutic intervention in order to improve the following deficits and impairments:  Decreased mobility, Increased muscle spasms, Hypomobility, Decreased range of motion, Decreased coordination, Decreased strength, Pain  Visit Diagnosis: Muscle weakness (generalized)  Stiffness of left shoulder, not elsewhere classified  Acute pain of left shoulder     Problem List Patient Active Problem List   Diagnosis Date Noted  . Attention deficit hyperactivity disorder (ADHD) 06/10/2019  . Pain from implanted hardware 09/15/2017  . Closed displaced fracture of  lateral malleolus of right fibula 08/20/2016  . Closed displaced spiral fracture of shaft of right tibia 08/04/2016  . Anxiety, generalized 03/31/2015  . ADD (attention deficit disorder) 03/31/2015    Blythe Stanford, PT DPT 08/31/2019, 3:06 PM  [facility] [address] [place], [address] Phone: [phone]   Fax:  [phone]  Name: Joel Burgess MRN: [id number] Date of Birth: 07-01-1976

## 2019-08-31 NOTE — Progress Notes (Signed)
Follow-up for this 43 year old man with history of ADHD.  Patient was reached by telephone.  Appropriate and cooperative in exam.  Patient has no new complaints.  Attention and focus are good.  No agitation no irritability.  Sleeping and eating well.  Anxiety under good control.  Denies any abuse of any of his medicine.  Patient was alert and oriented.  Appropriate affect and his voice.  Does not sound sedated or sluggish.  Denies any suicidal thoughts no evidence of psychosis.  Renew Adderall and Xanax.  I will also renew the Valium for now with a reminder that he should only take this as needed and very sparingly which she says he is doing now.  Hopefully we will be able to stop that soon.  Follow-up in 3 months.

## 2019-09-02 ENCOUNTER — Other Ambulatory Visit: Payer: Self-pay

## 2019-09-02 ENCOUNTER — Ambulatory Visit: Payer: Self-pay | Admitting: Occupational Therapy

## 2019-09-02 ENCOUNTER — Telehealth: Payer: Self-pay

## 2019-09-02 ENCOUNTER — Ambulatory Visit: Payer: Self-pay

## 2019-09-02 DIAGNOSIS — M6281 Muscle weakness (generalized): Secondary | ICD-10-CM

## 2019-09-02 DIAGNOSIS — M25512 Pain in left shoulder: Secondary | ICD-10-CM

## 2019-09-02 DIAGNOSIS — M25632 Stiffness of left wrist, not elsewhere classified: Secondary | ICD-10-CM

## 2019-09-02 DIAGNOSIS — M25642 Stiffness of left hand, not elsewhere classified: Secondary | ICD-10-CM

## 2019-09-02 DIAGNOSIS — M25612 Stiffness of left shoulder, not elsewhere classified: Secondary | ICD-10-CM

## 2019-09-02 DIAGNOSIS — M25532 Pain in left wrist: Secondary | ICD-10-CM

## 2019-09-02 NOTE — Therapy (Signed)
[facility] [address] [place], [address] Phone: [phone]   Fax:  [phone]  Occupational Therapy Treatment  Patient Details  Name: Joel Burgess MRN: [id number] Date of Birth: 1976-01-24 Referring Provider (OT): Burney Gauze   Encounter Date: 09/02/2019  OT End of Session - 09/02/19 0938    Visit Number  10    Number of Visits  14    Date for OT Re-Evaluation  09/21/19    OT Start Time  0900    OT Stop Time  0930    OT Time Calculation (min)  30 min    Activity Tolerance  Patient tolerated treatment well    Behavior During Therapy  [facility] for tasks assessed/performed       Past Medical History:  Diagnosis Date  . ADHD (attention deficit hyperactivity disorder)   . Anxiety     Past Surgical History:  Procedure Laterality Date  . APPENDECTOMY    . CARPAL TUNNEL RELEASE Left 04/23/2019   Procedure: LEFT CARPAL TUNNEL RELEASE;  Surgeon: Charlotte Crumb, MD;  Location: [place];  Service: Orthopedics;  Laterality: Left;  . KNEE SURGERY Right   . OPEN REDUCTION INTERNAL FIXATION (ORIF) DISTAL RADIAL FRACTURE Left 04/23/2019   Procedure: OPEN REDUCTION INTERNAL FIXATION (ORIF) DISTAL RADIAL FRACTURE;  Surgeon: Charlotte Crumb, MD;  Location: [place];  Service: Orthopedics;  Laterality: Left;  AXILLARY BLOCK    There were no vitals filed for this visit.  Subjective Assessment - 09/02/19 0933    Subjective   DOing okay- I could do 3 lbs for my shoulder - so I started doing 3 lbs for my wrist too - in all the directions - did the putty - but did not add the dark blue to green    Patient Stated Goals  I want to be able to use my  hand and wrist like before - so I can go back and to my work    Currently in Pain?  No/denies         [facility] OT Assessment - 09/02/19 0001      AROM   Right Wrist Extension  70 Degrees    Right Wrist Flexion  80 Degrees    Right Wrist  Radial Deviation  28 Degrees    Right Wrist Ulnar Deviation  32 Degrees    Left Wrist Extension  70 Degrees    Left Wrist Flexion  80 Degrees    Left Wrist Radial Deviation  28 Degrees    Left Wrist Ulnar Deviation  32 Degrees      Strength   Right Hand Grip (lbs)  85    Right Hand Lateral Pinch  28 lbs    Right Hand 3 Point Pinch  19 lbs    Left Hand Grip (lbs)  64    Left Hand Lateral Pinch  21.5 lbs    Left Hand 3 Point Pinch  14 lbs       assess wrist in all planes AROM - see flo wsheet  wall pushups done - pt to not come all the way up - and keep it pain free  2 sets of 10 and increase next few days to 3 sets  Wrist in all planes 5/5 strength  Still 2/10 pain for wrist extention - on volar wrist  Scar moving well   Grip and prehension strength did not increase   pt did not add dark blue firm to  green        Putty green - change to doing  3 point pinch and lat grip  And can add in few days - dark blue to green to increase resistance  Change to gripper for grip strength - 15 lbs 2 x 12 reps  Increase to 3 sets if pain free and hold 10 sec 10 reps x 3  Can increase to 20 lbs in few days  Pain free           OT Education - 09/02/19 0938    Education Details  progress and changes to HEP    Person(s) Educated  Patient    Methods  Explanation;Demonstration;Tactile cues;Verbal cues;Handout    Comprehension  Verbal cues required;Returned demonstration;Verbalized understanding       OT Short Term Goals - 08/23/19 1046      OT SHORT TERM GOAL #1   Title  Pt to be independing in HEP to increase digits flexion to touching palm and gripping tools    Status  Achieved      OT SHORT TERM GOAL #2   Title  L supination/pronation improve to WNL to turn doorknob and get change in hand    Baseline  pro, sup 90 and strength improving - but cannot get change out of hand , opposition to all digits at same time - decrease strength with twisting knob ,    Time  3    Period   Weeks    Status  On-going    Target Date  09/13/19        OT Long Term Goals - 08/23/19 1047      OT LONG TERM GOAL #1   Title  L wrist AROM improve to [facility] to using hand in more than 60% of act on PRWHE    Status  Achieved      OT LONG TERM GOAL #2   Title  L grip strength and prehension strength increase to more than 50% compare to R hand to carry and grip ADL's act of about 5 lbs    Baseline  AT EVAL , Grip L 15, R 85 lbs, Lat grip R 28 ,L 8 lbs  ; 3 point grip R 19 , L 7 lbs; NOW Grip 56lbsL, R 85, L 20 lbs and R point 10 lbs- carry 4 lbs without discomfort    Time  4    Period  Weeks    Status  On-going    Target Date  09/20/19      OT LONG TERM GOAL #3   Title  PRWHE pain score improve with more than 25 point    Baseline  score on PRWHE for pain at eval 32/50 at [facility] ; and now 12/50    Time  4    Period  Weeks    Status  On-going    Target Date  09/20/19            Plan - 09/02/19 0939    Clinical Impression Statement  Pt cont to make progress in wrist AROM , strength- now the last week grip and prehension stayed the same- change HEP to handgripper and change putty HEP - pt can start wall pushups - and follow up in week    OT Occupational Profile and History  Problem Focused Assessment - Including review of records relating to presenting problem    Occupational performance deficits (Please refer to evaluation for details):  ADL's;IADL's;Work;Play;Leisure;Social Participation    Body Structure /  Function / Physical Skills  ADL;ROM;UE functional use;Scar mobility;FMC;Dexterity;Pain;Strength;IADL    Rehab Potential  Good    Clinical Decision Making  Limited treatment options, no task modification necessary    Comorbidities Affecting Occupational Performance:  None    Modification or Assistance to Complete Evaluation   No modification of tasks or assist necessary to complete eval    OT Frequency  1x / week    OT Duration  4 weeks    OT Treatment/Interventions   Self-care/ADL training;Therapeutic exercise;Patient/family education;Splinting;Paraffin;Fluidtherapy;Contrast Bath;Manual Therapy;Passive range of motion;Scar mobilization    Plan  assess progress with changes to HEP    OT Home Exercise Plan  see pt instruction    Consulted and Agree with Plan of Care  Patient       Patient will benefit from skilled therapeutic intervention in order to improve the following deficits and impairments:   Body Structure / Function / Physical Skills: ADL, ROM, UE functional use, Scar mobility, FMC, Dexterity, Pain, Strength, IADL       Visit Diagnosis: Muscle weakness (generalized)  Stiffness of left wrist, not elsewhere classified  Pain in left wrist  Stiffness of left hand, not elsewhere classified    Problem List Patient Active Problem List   Diagnosis Date Noted  . Attention deficit hyperactivity disorder (ADHD) 06/10/2019  . Pain from implanted hardware 09/15/2017  . Closed displaced fracture of lateral malleolus of right fibula 08/20/2016  . Closed displaced spiral fracture of shaft of right tibia 08/04/2016  . Anxiety, generalized 03/31/2015  . ADD (attention deficit disorder) 03/31/2015    Rosalyn Gess OTR/L,CLT 09/02/2019, 9:40 AM  [facility] [address] [place], [address] Phone: [phone]   Fax:  [phone]  Name: Joel Burgess MRN: [id number] Date of Birth: [birth year]

## 2019-09-02 NOTE — Patient Instructions (Signed)
Pt cont with stretches for wrist flexion ,ext  wall slides  Start wall pushups - 10 reps  Increase to 1-3 sets pain free - but do not come up all the way  Putty green - add 3 point pinch and lat grip  And can add in few days - dark blue to green to increase resistance  Change to gripper for grip strength - 15 lbs 2 x 12 reps  Increase to 3 sets if pain free and hold 10 sec 10 reps x 3  Can increase to 20 lbs  Pain free

## 2019-09-02 NOTE — Telephone Encounter (Signed)
Patient called and states that the Adderall was sent to [facility] and it needs to go to [place] on [address]. Please resend prescriptions and I will call and dc the ones at [facility], thank you

## 2019-09-02 NOTE — Therapy (Signed)
[facility] [address] [place], [address] Phone: [phone]   Fax:  [phone]  Physical Therapy Treatment  Patient Details  Name: Joel Burgess MRN: [id number] Date of Birth: 03/01/1976 Referring Provider (PT): Burney Gauze MD   Encounter Date: 09/02/2019  PT End of Session - 09/02/19 1318    Visit Number  5    Number of Visits  13    Date for PT Re-Evaluation  09/29/19    PT Start Time  0820    PT Stop Time  0905    PT Time Calculation (min)  45 min    Activity Tolerance  Patient tolerated treatment well    Behavior During Therapy  [facility] for tasks assessed/performed       Past Medical History:  Diagnosis Date  . ADHD (attention deficit hyperactivity disorder)   . Anxiety     Past Surgical History:  Procedure Laterality Date  . APPENDECTOMY    . CARPAL TUNNEL RELEASE Left 04/23/2019   Procedure: LEFT CARPAL TUNNEL RELEASE;  Surgeon: Charlotte Crumb, MD;  Location: [place];  Service: Orthopedics;  Laterality: Left;  . KNEE SURGERY Right   . OPEN REDUCTION INTERNAL FIXATION (ORIF) DISTAL RADIAL FRACTURE Left 04/23/2019   Procedure: OPEN REDUCTION INTERNAL FIXATION (ORIF) DISTAL RADIAL FRACTURE;  Surgeon: Charlotte Crumb, MD;  Location: [place];  Service: Orthopedics;  Laterality: Left;  AXILLARY BLOCK    There were no vitals filed for this visit.  Subjective Assessment - 09/02/19 0900    Subjective  Patient reports residual limitations in hand behind back and reaching with external/internal rotation    Pertinent History  Increased ankle pain after ORIF; currently being seen for OT for wrist and hand    Limitations  Lifting    Patient Stated Goals  To improve shoulder flexion    Currently in Pain?  No/denies    Pain Score  --    Pain Location  --    Pain Orientation  --    Pain Descriptors / Indicators  --    Pain Onset  --    Multiple Pain Sites  --            TREATMENT  Manual therapy Inferior glides grade 4 x 50 for increased abduction external rotation internal rotation for full pain-free ROM with reaching MWM inferior glide with abduction x 20 PROM to AAROM to AROM to restore pain-free neuromuscular patterning with overhead movement Posterior glides grades 3-4 x 30 for increased external internal rotation for pain-free ROM with reaching Sustained posterior glide with internal/external rotation x 10 each to restore pain-free neuromuscular patterning Trigger point release to pec major pec minor Soft tissue mobilization to bicep x 3 minutes to reduce tissue tension for improved glenohumeral kinematics  Therex Active stretch of pec major 3 x 10 sec to reduce tissue tension for improved glenohumeral kinematics "Junior special" reaching with shoulder abduction, extension, internal rotation + 2 lb resistance for functional 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) PNF UE D1 with bias towards horizontal add x 10 to restore pain-free AROM with optimal neuromuscular patterning Posterior capsule stretch arm across body 10 x 5 sec to increase ROM and decrease end-range anterior shoulder pain EROT with band 2 x 10 - visual verbal tactile cues for improved target muscle activation, prevention of compensation via scapular elevation/lumbar extension Horizontal abd with band and scap stabilization x 20    PT Education -  09/02/19 1317    Education Details  form/technique with exercise    Person(s) Educated  Patient    Methods  Explanation;Demonstration;Tactile cues;Verbal cues    Comprehension  Verbalized understanding;Returned demonstration;Verbal cues required;Tactile cues required          PT Long Term Goals - 08/18/19 1604      PT LONG TERM GOAL #1   Title  Pt will be independent with HEP in order to improve strength and balance in order to  improve function at home and work.     Baseline  Dependent with exercise     Time  6    Period  Weeks    Status  New      PT LONG TERM GOAL #2   Title  Patient will be able to perform shoulder flexion throuhgout full AROM without any increase in pain to return to occupational related tasks.    Baseline  Painful Arc between 100 and 150 degrees    Time  6    Period  Weeks    Status  New      PT LONG TERM GOAL #3   Title  Patient will improve QuickDASH to under 10% to indicate signficant improvement in pain and spasms and UE function to better be able to perform work related tasks.    Baseline  70% QuickDASH    Time  6    Period  Weeks    Status  New      PT LONG TERM GOAL #4   Title  Patient will score equally on his Apley Scratch test Bilaterally in each direction behind the back and behind the head to better be able to clean his back.    Baseline  L shoulder: Behind the back: L3, Behind the head: T1; R shoulder: Behind the back: T6, Behind the head T6    Time  6    Period  Weeks    Status  New            Plan - 09/02/19 1320    Clinical Impression Statement  Patient demonstrates near-normal ROM with residual pain at end-range triplanar movements, especially extension, abduction and IR/ER, with compensatory early scapular rise and abnormal glenohumeral kinematics secondary to tightness in pectorals and biceps. Pain reduced and normalization of scapulohumeral rhythm with scapular stability therex and manual mobilizations. Plan to progress scapular stability with end-range movements and active stretches PRN. Patient will benefit from further skilled physical therapy to return to PLOF.    Personal Factors and Comorbidities  Comorbidity 2    Comorbidities  wrist/hand pain    Examination-Activity Limitations  Lift    Examination-Participation Restrictions  Cleaning    Stability/Clinical Decision Making  Stable/Uncomplicated    Rehab Potential  Good    PT Frequency  2x / week    PT Duration  6 weeks    PT Treatment/Interventions  Cryotherapy;Electrical  Stimulation;Moist Heat;Functional mobility training;Therapeutic activities;Therapeutic exercise;Neuromuscular re-education;Patient/family education;Manual techniques;Dry needling;Passive range of motion;Spinal Manipulations;Joint Manipulations    PT Next Visit Plan  Progress strenghtening and coordination    PT Home Exercise Plan  See education section       Patient will benefit from skilled therapeutic intervention in order to improve the following deficits and impairments:  Decreased mobility, Increased muscle spasms, Hypomobility, Decreased range of motion, Decreased coordination, Decreased strength, Pain  Visit Diagnosis: No diagnosis found.     Problem List Patient Active Problem List   Diagnosis Date Noted  . Attention deficit  hyperactivity disorder (ADHD) 06/10/2019  . Pain from implanted hardware 09/15/2017  . Closed displaced fracture of lateral malleolus of right fibula 08/20/2016  . Closed displaced spiral fracture of shaft of right tibia 08/04/2016  . Anxiety, generalized 03/31/2015  . ADD (attention deficit disorder) 03/31/2015    Blythe Stanford 09/02/2019, 1:32 PM  [facility] [facility] [address] [place], [address] Phone: [phone]   Fax:  [phone]  Name: Joel Burgess MRN: [id number] Date of Birth: [birth year]

## 2019-09-06 ENCOUNTER — Other Ambulatory Visit: Payer: Self-pay

## 2019-09-06 ENCOUNTER — Ambulatory Visit: Payer: Self-pay | Admitting: Occupational Therapy

## 2019-09-06 ENCOUNTER — Ambulatory Visit: Payer: Self-pay

## 2019-09-06 DIAGNOSIS — M6281 Muscle weakness (generalized): Secondary | ICD-10-CM

## 2019-09-06 DIAGNOSIS — M25612 Stiffness of left shoulder, not elsewhere classified: Secondary | ICD-10-CM

## 2019-09-07 NOTE — Therapy (Signed)
[place] [facility] [address], [place], [address] Phone: [phone]   Fax:  [phone]  Physical Therapy Treatment  Patient Details  Name: Joel Burgess MRN: [id number] Date of Birth: [birth year] Referring Provider (PT): Burney Gauze MD   Encounter Date: 09/06/2019  PT End of Session - 09/06/19 1812    Visit Number  6    Number of Visits  13    Date for PT Re-Evaluation  09/29/19    PT Start Time  1800    PT Stop Time  1845    PT Time Calculation (min)  45 min    Activity Tolerance  Patient tolerated treatment well    Behavior During Therapy  [facility] for tasks assessed/performed       Past Medical History:  Diagnosis Date  . ADHD (attention deficit hyperactivity disorder)   . Anxiety     Past Surgical History:  Procedure Laterality Date  . APPENDECTOMY    . CARPAL TUNNEL RELEASE Left 04/23/2019   Procedure: LEFT CARPAL TUNNEL RELEASE;  Surgeon: Charlotte Crumb, MD;  Location: [place];  Service: Orthopedics;  Laterality: Left;  . KNEE SURGERY Right   . OPEN REDUCTION INTERNAL FIXATION (ORIF) DISTAL RADIAL FRACTURE Left 04/23/2019   Procedure: OPEN REDUCTION INTERNAL FIXATION (ORIF) DISTAL RADIAL FRACTURE;  Surgeon: Charlotte Crumb, MD;  Location: [place];  Service: Orthopedics;  Laterality: Left;  AXILLARY BLOCK    There were no vitals filed for this visit.  Subjective Assessment - 09/06/19 1807    Subjective  Patient states his shoulder is feeling overall better compared to the initial session. Patient states he has limitations in reaching.    Pertinent History  Increased ankle pain after ORIF; currently being seen for OT for wrist and hand    Limitations  Lifting    Patient Stated Goals  To improve shoulder flexion    Currently in Pain?  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) B EROT with band 2 x 10 - visual verbal tactile cues for improved target muscle activation, prevention of compensation via scapular elevation/lumbar extension with RTB Horizontal abd with band and scap stabilization x 20 coordination and strength  PT Education - 09/06/19 1808    Education Details  form/technique with exercise    Person(s) Educated  Patient    Methods  Explanation;Demonstration    Comprehension  Verbalized understanding;Returned demonstration          PT Long Term Goals - 08/18/19 1604      PT LONG TERM GOAL #1   Title  Pt will be independent with HEP in order to improve strength and balance in order to  improve function at home and work.     Baseline  Dependent with exercise    Time  6    Period  Weeks    Status  New      PT LONG TERM GOAL #2   Title  Patient will be able to perform shoulder flexion throuhgout full AROM without any increase  in pain to return to occupational related tasks.    Baseline  Painful Arc between 100 and 150 degrees    Time  6    Period  Weeks    Status  New      PT LONG TERM GOAL #3   Title  Patient will improve QuickDASH to under 10% to indicate signficant improvement in pain and spasms and UE function to better be able to perform work related tasks.    Baseline  70% QuickDASH    Time  6    Period  Weeks    Status  New      PT LONG TERM GOAL #4   Title  Patient will score equally on his Apley Scratch test Bilaterally in each direction behind the back and behind the head to better be able to clean his back.    Baseline  L shoulder: Behind the back: L3, Behind the head: T1; R shoulder: Behind the back: T6, Behind  the head T6    Time  6    Period  Weeks    Status  New            Plan - 09/07/19 0909    Clinical Impression Statement  Patient demonstrates improvement with reaching behind his back and performing shoulder abduction. However, he continues to have difficulty with performing exercises/movements into closed pack position. Patient continues to have some strength limitations and difficulties with performing lifting activities, which continues to have limitatios secondary to the previous wrist fracture. Patient will benefit from further skilled therapy to return to prior level of function.    Personal Factors and Comorbidities  Comorbidity 2    Comorbidities  wrist/hand pain    Examination-Activity Limitations  Lift    Examination-Participation Restrictions  Cleaning    Stability/Clinical Decision Making  Stable/Uncomplicated    Rehab Potential  Good    PT Frequency  2x / week    PT Duration  6 weeks    PT Treatment/Interventions  Cryotherapy;Electrical Stimulation;Moist Heat;Functional mobility training;Therapeutic activities;Therapeutic exercise;Neuromuscular re-education;Patient/family education;Manual techniques;Dry needling;Passive range of motion;Spinal Manipulations;Joint Manipulations    PT Next Visit Plan  Progress strenghtening and coordination    PT Home Exercise Plan  See education section       Patient will benefit from skilled therapeutic intervention in order to improve the following deficits and impairments:  Decreased mobility, Increased muscle spasms, Hypomobility, Decreased range of motion, Decreased coordination, Decreased strength, Pain  Visit Diagnosis: Muscle weakness (generalized)  Stiffness of left shoulder, not elsewhere classified     Problem List Patient Active Problem List   Diagnosis Date Noted  . Attention deficit hyperactivity disorder (ADHD) 06/10/2019  . Pain from implanted hardware 09/15/2017  . Closed displaced fracture of lateral malleolus  of right fibula 08/20/2016  . Closed displaced spiral fracture of shaft of right tibia 08/04/2016  . Anxiety, generalized 03/31/2015  . ADD (attention deficit disorder) 03/31/2015    Blythe Stanford, PT DPT 09/07/2019, 9:46 AM  [place] [facility] [address], [place], [address] Phone: [phone]   Fax:  [phone]  Name: Joel Burgess MRN: [id number] Date of Birth: [birth year]

## 2019-09-08 ENCOUNTER — Other Ambulatory Visit: Payer: Self-pay

## 2019-09-08 ENCOUNTER — Ambulatory Visit: Payer: Self-pay | Admitting: Occupational Therapy

## 2019-09-08 ENCOUNTER — Ambulatory Visit: Payer: Self-pay

## 2019-09-08 DIAGNOSIS — M25612 Stiffness of left shoulder, not elsewhere classified: Secondary | ICD-10-CM

## 2019-09-08 DIAGNOSIS — M25632 Stiffness of left wrist, not elsewhere classified: Secondary | ICD-10-CM

## 2019-09-08 DIAGNOSIS — M25532 Pain in left wrist: Secondary | ICD-10-CM

## 2019-09-08 DIAGNOSIS — M6281 Muscle weakness (generalized): Secondary | ICD-10-CM

## 2019-09-08 DIAGNOSIS — M25642 Stiffness of left hand, not elsewhere classified: Secondary | ICD-10-CM

## 2019-09-08 DIAGNOSIS — M25512 Pain in left shoulder: Secondary | ICD-10-CM

## 2019-09-08 NOTE — Patient Instructions (Signed)
Provided dark blue putty - firm for gripping , lat and 3 point grip  And can increase gripper to 20 lbs -  Do in the am and gripping with putty in afternoon -  Prehension 2 x day  Use weight or bands with PT for shoulder for wrist

## 2019-09-08 NOTE — Therapy (Signed)
[place] [facility] [address] [place], [address] Phone: [phone]   Fax:  [phone]  Occupational Therapy Treatment  Patient Details  Name: Joel Burgess MRN: [id number] Date of Birth: 16-Jun-1976 Referring Provider (OT): Burney Gauze   Encounter Date: 09/08/2019  OT End of Session - 09/08/19 1347    Visit Number  11    Number of Visits  14    Date for OT Re-Evaluation  09/21/19    OT Start Time  [id number]    OT Stop Time  1413    OT Time Calculation (min)  24 min    Activity Tolerance  Patient tolerated treatment well    Behavior During Therapy  [facility] for tasks assessed/performed       Past Medical History:  Diagnosis Date  . ADHD (attention deficit hyperactivity disorder)   . Anxiety     Past Surgical History:  Procedure Laterality Date  . APPENDECTOMY    . CARPAL TUNNEL RELEASE Left 04/23/2019   Procedure: LEFT CARPAL TUNNEL RELEASE;  Surgeon: Charlotte Crumb, MD;  Location: [place];  Service: Orthopedics;  Laterality: Left;  . KNEE SURGERY Right   . OPEN REDUCTION INTERNAL FIXATION (ORIF) DISTAL RADIAL FRACTURE Left 04/23/2019   Procedure: OPEN REDUCTION INTERNAL FIXATION (ORIF) DISTAL RADIAL FRACTURE;  Surgeon: Charlotte Crumb, MD;  Location: [place];  Service: Orthopedics;  Laterality: Left;  AXILLARY BLOCK    There were no vitals filed for this visit.  Subjective Assessment - 09/08/19 1416    Subjective   Doing okay- my arm sore now after the needling in my shoulder - but I did the putty and hand gripper at home - pushup cannot go all the way up - pull at wrist - but not more than 3/10    Patient Stated Goals  I want to be able to use my  hand and wrist like before - so I can go back and to my work    Currently in Pain?  Yes    Pain Score  3     Pain Location  Wrist    Pain Orientation  Left    Pain Descriptors / Indicators  Tightness;Aching    Pain Type   Surgical pain    Aggravating Factors   wrist extention end range weight bearing         [facility] OT Assessment - 09/08/19 0001      AROM   Right Wrist Extension  70 Degrees    Right Wrist Flexion  85 Degrees    Right Wrist Radial Deviation  28 Degrees    Right Wrist Ulnar Deviation  32 Degrees    Left Wrist Extension  70 Degrees    Left Wrist Flexion  80 Degrees    Left Wrist Radial Deviation  28 Degrees    Left Wrist Ulnar Deviation  32 Degrees      Strength   Right Hand Grip (lbs)  85    Right Hand Lateral Pinch  28 lbs    Right Hand 3 Point Pinch  19 lbs    Left Hand Grip (lbs)  68    Left Hand Lateral Pinch  20 lbs    Left Hand 3 Point Pinch  14 lbs      Measurements taken - some progress in grip strength - but other same or less-  but pt feels his grip and prehension affected by dry needling he had prior to session  in PT in shoulder/pect Pt to cont with end range PROM , stretch for wrist flexion , extention at home - pain free pushup on wall  And can participate with wrist in shoulder rehab - advance weight that way -  Putty increase to firm dark blue for grip , lat and 3 point grip   did not had pain and can do at home if pain free  Alternate with gripper - he can increase that to 20 lbs if pain free   Pt will come in  15 min prior to PT next week to get measure                     OT Education - 09/08/19 1347    Education Details  putty upgrade with HEP    Person(s) Educated  Patient    Methods  Explanation;Demonstration;Tactile cues;Verbal cues;Handout    Comprehension  Verbal cues required;Returned demonstration;Verbalized understanding       OT Short Term Goals - 08/23/19 1046      OT SHORT TERM GOAL #1   Title  Pt to be independing in HEP to increase digits flexion to touching palm and gripping tools    Status  Achieved      OT SHORT TERM GOAL #2   Title  L supination/pronation improve to WNL to turn doorknob and get change in hand     Baseline  pro, sup 90 and strength improving - but cannot get change out of hand , opposition to all digits at same time - decrease strength with twisting knob ,    Time  3    Period  Weeks    Status  On-going    Target Date  09/13/19        OT Long Term Goals - 08/23/19 1047      OT LONG TERM GOAL #1   Title  L wrist AROM improve to [facility] to using hand in more than 60% of act on PRWHE    Status  Achieved      OT LONG TERM GOAL #2   Title  L grip strength and prehension strength increase to more than 50% compare to R hand to carry and grip ADL's act of about 5 lbs    Baseline  AT EVAL , Grip L 15, R 85 lbs, Lat grip R 28 ,L 8 lbs  ; 3 point grip R 19 , L 7 lbs; NOW Grip 56lbsL, R 85, L 20 lbs and R point 10 lbs- carry 4 lbs without discomfort    Time  4    Period  Weeks    Status  On-going    Target Date  09/20/19      OT LONG TERM GOAL #3   Title  PRWHE pain score improve with more than 25 point    Baseline  score on PRWHE for pain at eval 32/50 at [facility] ; and now 12/50    Time  4    Period  Weeks    Status  On-going    Target Date  09/20/19            Plan - 09/08/19 1347    Clinical Impression Statement  Pt  resistance for putty and hand grippper was increase last week for HEP - did show some progress in grip - but pt had dryneedling this date in pect just before session and had increase pain possible affecting measurements - upgrade his putty for HEP - and  pt to be seen for measurements prior to PT appt next week.    OT Occupational Profile and History  Problem Focused Assessment - Including review of records relating to presenting problem    Occupational performance deficits (Please refer to evaluation for details):  ADL's;IADL's;Work;Play;Leisure;Social Participation    Body Structure / Function / Physical Skills  ADL;ROM;UE functional use;Scar mobility;FMC;Dexterity;Pain;Strength;IADL    Rehab Potential  Good    Clinical Decision Making  Limited treatment options, no  task modification necessary    Comorbidities Affecting Occupational Performance:  None    Modification or Assistance to Complete Evaluation   No modification of tasks or assist necessary to complete eval    OT Frequency  1x / week    OT Duration  4 weeks    OT Treatment/Interventions  Self-care/ADL training;Therapeutic exercise;Patient/family education;Splinting;Paraffin;Fluidtherapy;Contrast Bath;Manual Therapy;Passive range of motion;Scar mobilization    Plan  assess progress in grip and prhension strength    OT Home Exercise Plan  see pt instruction    Consulted and Agree with Plan of Care  Patient       Patient will benefit from skilled therapeutic intervention in order to improve the following deficits and impairments:   Body Structure / Function / Physical Skills: ADL, ROM, UE functional use, Scar mobility, FMC, Dexterity, Pain, Strength, IADL       Visit Diagnosis: Stiffness of left wrist, not elsewhere classified  Pain in left wrist  Stiffness of left hand, not elsewhere classified    Problem List Patient Active Problem List   Diagnosis Date Noted  . Attention deficit hyperactivity disorder (ADHD) 06/10/2019  . Pain from implanted hardware 09/15/2017  . Closed displaced fracture of lateral malleolus of right fibula 08/20/2016  . Closed displaced spiral fracture of shaft of right tibia 08/04/2016  . Anxiety, generalized 03/31/2015  . ADD (attention deficit disorder) 03/31/2015    Rosalyn Gess OTR/L,CLT 09/08/2019, 2:22 PM  [place] [facility] [facility] [address] [place], [address] Phone: [phone]   Fax:  [phone]  Name: Joel Burgess MRN: [id number] Date of Birth: 07-03-1976

## 2019-09-08 NOTE — Therapy (Signed)
[place] [facility] [address], [place], [address] Phone: [phone]   Fax:  [phone]  Physical Therapy Treatment  Patient Details  Name: Joel Burgess MRN: [id number] Date of Birth: 07/26/1976 Referring Provider (PT): Burney Gauze MD   Encounter Date: 09/08/2019  PT End of Session - 09/08/19 1409    Visit Number  7    Number of Visits  13    Date for PT Re-Evaluation  09/29/19    PT Start Time  1300    PT Stop Time  1350    PT Time Calculation (min)  50 min    Activity Tolerance  Patient tolerated treatment well;No increased pain    Behavior During Therapy  [facility] for tasks assessed/performed       Past Medical History:  Diagnosis Date  . ADHD (attention deficit hyperactivity disorder)   . Anxiety     Past Surgical History:  Procedure Laterality Date  . APPENDECTOMY    . CARPAL TUNNEL RELEASE Left 04/23/2019   Procedure: LEFT CARPAL TUNNEL RELEASE;  Surgeon: Charlotte Crumb, MD;  Location: [place];  Service: Orthopedics;  Laterality: Left;  . KNEE SURGERY Right   . OPEN REDUCTION INTERNAL FIXATION (ORIF) DISTAL RADIAL FRACTURE Left 04/23/2019   Procedure: OPEN REDUCTION INTERNAL FIXATION (ORIF) DISTAL RADIAL FRACTURE;  Surgeon: Charlotte Crumb, MD;  Location: [place];  Service: Orthopedics;  Laterality: Left;  AXILLARY BLOCK    There were no vitals filed for this visit.  Subjective Assessment - 09/08/19 1309    Subjective  Patient reports feeling better with reaching. Mild residual limitations in HBB. Some wrist limitations remain in flexion extension    Pertinent History  Increased ankle pain after ORIF; currently being seen for OT for wrist and hand    Limitations  Lifting    Patient Stated Goals  To improve shoulder flexion    Currently in Pain?  No/denies             Observation: Full AROM except for residual limitations in end-range abduction, end-range  IR  Manual therapy Manual release of lat, pec with active abduction and horizontal adduction [place] inferior mobilization grade 3-4 for full overhead ROM pain-free Pec stretch manual assistance 3 x 30 sec for improved tissue extensibility and reduced spasm DN 56mmx40mm 2 needles to L pectoral with patient positioned in supine to decrease pec major spasms and increased pain. Patient educated on potential benefits and risks of dry needling. Dry needling performed during manual therapy.    Therapeutic exercise Angels 3 x 10 with tactile cues for full excursion lat pull downs with bias towards scap depression 35# 2 x 10 for increased UE strength and tissue extensibility Bilateral curl in full pronation to overhead press 10# reduced to 6# 2 x 10 for UE strengthening in functional pattern for work activities PNF UE D2 4# reduced to 3# 2 x 10 for functional strengthening and scapulohumeral rhythm  Plan Progress shld strengthening with triplanar and combined movements as tolerated, focus on scapular depression/stabilization with functional movement   PT Education - 09/08/19 1345    Education Details  form/technique with exercise    Person(s) Educated  Patient    Methods  Explanation;Demonstration;Tactile cues;Verbal cues    Comprehension  Verbalized understanding;Returned demonstration;Verbal cues required;Tactile cues required          PT Long Term Goals - 08/18/19 1604      PT LONG TERM GOAL #1  Title  Pt will be independent with HEP in order to improve strength and balance in order to  improve function at home and work.     Baseline  Dependent with exercise    Time  6    Period  Weeks    Status  New      PT LONG TERM GOAL #2   Title  Patient will be able to perform shoulder flexion throuhgout full AROM without any increase in pain to return to occupational related tasks.    Baseline  Painful Arc between 100 and 150 degrees    Time  6    Period  Weeks    Status  New      PT LONG  TERM GOAL #3   Title  Patient will improve QuickDASH to under 10% to indicate signficant improvement in pain and spasms and UE function to better be able to perform work related tasks.    Baseline  70% QuickDASH    Time  6    Period  Weeks    Status  New      PT LONG TERM GOAL #4   Title  Patient will score equally on his Apley Scratch test Bilaterally in each direction behind the back and behind the head to better be able to clean his back.    Baseline  L shoulder: Behind the back: L3, Behind the head: T1; R shoulder: Behind the back: T6, Behind the head T6    Time  6    Period  Weeks    Status  New            Plan - 09/08/19 1409    Clinical Impression Statement  Patient demonstrates near-full ROM but neuromuscular deficits remain including early scapular rise with overhead motions, improved after exercise. Remaining trigger points and reduced tissue extensibility in lats and pectorals likely driving end-range pain and neuromuscular patterning deficits. Strength improved since beginning therapy however additional work hardening needed. Patient will benefit from additional skilled physical therapy to return to PLOF.    Personal Factors and Comorbidities  Comorbidity 2    Comorbidities  wrist/hand pain    Examination-Activity Limitations  Lift    Examination-Participation Restrictions  Cleaning    Stability/Clinical Decision Making  Stable/Uncomplicated    Rehab Potential  Good    PT Frequency  2x / week    PT Duration  6 weeks    PT Treatment/Interventions  Cryotherapy;Electrical Stimulation;Moist Heat;Functional mobility training;Therapeutic activities;Therapeutic exercise;Neuromuscular re-education;Patient/family education;Manual techniques;Dry needling;Passive range of motion;Spinal Manipulations;Joint Manipulations    PT Next Visit Plan  Progress strenghtening and coordination    PT Home Exercise Plan  See education section    Consulted and Agree with Plan of Care  Patient        Patient will benefit from skilled therapeutic intervention in order to improve the following deficits and impairments:  Decreased mobility, Increased muscle spasms, Hypomobility, Decreased range of motion, Decreased coordination, Decreased strength, Pain  Visit Diagnosis: Stiffness of left wrist, not elsewhere classified  Pain in left wrist  Muscle weakness (generalized)  Stiffness of left shoulder, not elsewhere classified  Acute pain of left shoulder     Problem List Patient Active Problem List   Diagnosis Date Noted  . Attention deficit hyperactivity disorder (ADHD) 06/10/2019  . Pain from implanted hardware 09/15/2017  . Closed displaced fracture of lateral malleolus of right fibula 08/20/2016  . Closed displaced spiral fracture of shaft of right tibia 08/04/2016  . Anxiety,  generalized 03/31/2015  . ADD (attention deficit disorder) 03/31/2015    Virgia Land, SPT 09/08/2019, 2:12 PM  [place] [facility] [address] [place], [address] Phone: [phone]   Fax:  [phone]  Name: Joel Burgess MRN: [id number] Date of Birth: March 18, 1976

## 2019-09-13 ENCOUNTER — Other Ambulatory Visit: Payer: Self-pay

## 2019-09-13 ENCOUNTER — Ambulatory Visit: Payer: Self-pay

## 2019-09-13 DIAGNOSIS — M25612 Stiffness of left shoulder, not elsewhere classified: Secondary | ICD-10-CM

## 2019-09-13 DIAGNOSIS — M6281 Muscle weakness (generalized): Secondary | ICD-10-CM

## 2019-09-13 DIAGNOSIS — M25512 Pain in left shoulder: Secondary | ICD-10-CM

## 2019-09-14 NOTE — Therapy (Signed)
[facility] [address], [place], [address] Phone: [phone]   Fax:  [phone]  Physical Therapy Treatment  Patient Details  Name: Joel Burgess MRN: [id number] Date of Birth: 1976/03/17 Referring Provider (PT): Burney Gauze MD   Encounter Date: 09/13/2019  PT End of Session - 09/13/19 1854    Visit Number  8    Number of Visits  13    Date for PT Re-Evaluation  09/29/19    PT Start Time  1845    PT Stop Time  1930    PT Time Calculation (min)  45 min    Activity Tolerance  Patient tolerated treatment well;No increased pain    Behavior During Therapy  WFL for tasks assessed/performed       Past Medical History:  Diagnosis Date  . ADHD (attention deficit hyperactivity disorder)   . Anxiety     Past Surgical History:  Procedure Laterality Date  . APPENDECTOMY    . CARPAL TUNNEL RELEASE Left 04/23/2019   Procedure: LEFT CARPAL TUNNEL RELEASE;  Surgeon: Charlotte Crumb, MD;  Location: [place] [facility];  Service: Orthopedics;  Laterality: Left;  . KNEE SURGERY Right   . OPEN REDUCTION INTERNAL FIXATION (ORIF) DISTAL RADIAL FRACTURE Left 04/23/2019   Procedure: OPEN REDUCTION INTERNAL FIXATION (ORIF) DISTAL RADIAL FRACTURE;  Surgeon: Charlotte Crumb, MD;  Location: [place];  Service: Orthopedics;  Laterality: Left;  AXILLARY BLOCK    There were no vitals filed for this visit.  Subjective Assessment - 09/13/19 1852    Subjective  Patient states increased pain with reaching certain directions along the top of the shoulder. Patient reports he is improving in reaching behind his back but states this motion continues to be limited.    Currently in Pain?  Yes    Pain Score  1     Pain Location  Shoulder    Pain Orientation  Left    Pain Descriptors / Indicators  Aching    Pain Type  Surgical pain    Pain Onset  More than a month ago    Pain Frequency  Intermittent       TREATMENT   Therapeutic Exercise Behind the back reaches in standing- x 20 Active stretch of pec major 3 x 10 sec to reduce tissue tension for improved glenohumeral kinematics within the door frame Horizontal add with band and scap stabilization x 20 coordination and strength RTB Scapular retractions with hands behind back - x 20  Pec flys in supine to improve horizontal abduction AROM and pec major strength - x 20 2# Spelling out name with shoulder and arm straight to improve strength along the affected shoulder - x20    Manual Therapy STM performed to pec major to decrease increased pain and spasms with patient positioned in supine during the entirety of the session. - x 8 min During manual therapy: performed dry needling to pec major   PT Education - 09/13/19 1853    Education Details  form/technique with exercise    Person(s) Educated  Patient    Methods  Explanation;Demonstration    Comprehension  Verbalized understanding;Returned demonstration          PT Long Term Goals - 08/18/19 1604      PT LONG TERM GOAL #1   Title  Pt will be independent with HEP in order to improve strength and balance in order to  improve function at home and work.  Baseline  Dependent with exercise    Time  6    Period  Weeks    Status  New      PT LONG TERM GOAL #2   Title  Patient will be able to perform shoulder flexion throuhgout full AROM without any increase in pain to return to occupational related tasks.    Baseline  Painful Arc between 100 and 150 degrees    Time  6    Period  Weeks    Status  New      PT LONG TERM GOAL #3   Title  Patient will improve QuickDASH to under 10% to indicate signficant improvement in pain and spasms and UE function to better be able to perform work related tasks.    Baseline  70% QuickDASH    Time  6    Period  Weeks    Status  New      PT LONG TERM GOAL #4   Title  Patient will score equally on his Apley Scratch test Bilaterally in each  direction behind the back and behind the head to better be able to clean his back.    Baseline  L shoulder: Behind the back: L3, Behind the head: T1; R shoulder: Behind the back: T6, Behind the head T6    Time  6    Period  Weeks    Status  New            Plan - 09/14/19 1435    Clinical Impression Statement  Patient demonstrates improvement with pec major guarding compared to previous session. Patient demonstrates decreased pain after perforrming mobility cased exercises. Patient demonstrates increased pain with shoulder abduction horizontally which is improved with less resistance performed along the affected side. Patient is improving overall and will benefit from further skilled therapy to return to prior level of function.    Personal Factors and Comorbidities  Comorbidity 2    Comorbidities  wrist/hand pain    Examination-Activity Limitations  Lift    Examination-Participation Restrictions  Cleaning    Stability/Clinical Decision Making  Stable/Uncomplicated    Rehab Potential  Good    PT Frequency  2x / week    PT Duration  6 weeks    PT Treatment/Interventions  Cryotherapy;Electrical Stimulation;Moist Heat;Functional mobility training;Therapeutic activities;Therapeutic exercise;Neuromuscular re-education;Patient/family education;Manual techniques;Dry needling;Passive range of motion;Spinal Manipulations;Joint Manipulations    PT Next Visit Plan  Progress strenghtening and coordination    PT Home Exercise Plan  See education section    Consulted and Agree with Plan of Care  Patient       Patient will benefit from skilled therapeutic intervention in order to improve the following deficits and impairments:  Decreased mobility, Increased muscle spasms, Hypomobility, Decreased range of motion, Decreased coordination, Decreased strength, Pain  Visit Diagnosis: Muscle weakness (generalized)  Stiffness of left shoulder, not elsewhere classified  Acute pain of left  shoulder     Problem List Patient Active Problem List   Diagnosis Date Noted  . Attention deficit hyperactivity disorder (ADHD) 06/10/2019  . Pain from implanted hardware 09/15/2017  . Closed displaced fracture of lateral malleolus of right fibula 08/20/2016  . Closed displaced spiral fracture of shaft of right tibia 08/04/2016  . Anxiety, generalized 03/31/2015  . ADD (attention deficit disorder) 03/31/2015    Blythe Stanford, PT DPT 09/14/2019, 2:49 PM  [facility] [address] [place], [address] Phone: [phone]   Fax:  [phone]  Name: Joel Burgess MRN: [id number] Date of Birth: Sep 22, 1976

## 2019-09-15 ENCOUNTER — Other Ambulatory Visit: Payer: Self-pay

## 2019-09-15 ENCOUNTER — Ambulatory Visit: Payer: Self-pay | Admitting: Occupational Therapy

## 2019-09-15 ENCOUNTER — Ambulatory Visit: Payer: Self-pay

## 2019-09-15 DIAGNOSIS — M6281 Muscle weakness (generalized): Secondary | ICD-10-CM

## 2019-09-15 DIAGNOSIS — M25612 Stiffness of left shoulder, not elsewhere classified: Secondary | ICD-10-CM

## 2019-09-15 DIAGNOSIS — M25632 Stiffness of left wrist, not elsewhere classified: Secondary | ICD-10-CM

## 2019-09-15 DIAGNOSIS — M25532 Pain in left wrist: Secondary | ICD-10-CM

## 2019-09-15 NOTE — Therapy (Signed)
[facility] [address] [place], [address] Phone: [phone]   Fax:  [phone]  Occupational Therapy Treatment  Patient Details  Name: Joel Burgess MRN: [id number] Date of Birth: [birth year] Referring Provider (OT): Burney Gauze   Encounter Date: 09/15/2019  OT End of Session - 09/15/19 1401    Visit Number  12    Number of Visits  14    Date for OT Re-Evaluation  09/21/19    OT Start Time  1350    OT Stop Time  1415    OT Time Calculation (min)  25 min    Activity Tolerance  Patient tolerated treatment well    Behavior During Therapy  [facility] for tasks assessed/performed       Past Medical History:  Diagnosis Date  . ADHD (attention deficit hyperactivity disorder)   . Anxiety     Past Surgical History:  Procedure Laterality Date  . APPENDECTOMY    . CARPAL TUNNEL RELEASE Left 04/23/2019   Procedure: LEFT CARPAL TUNNEL RELEASE;  Surgeon: Charlotte Crumb, MD;  Location: [place] [place];  Service: Orthopedics;  Laterality: Left;  . KNEE SURGERY Right   . OPEN REDUCTION INTERNAL FIXATION (ORIF) DISTAL RADIAL FRACTURE Left 04/23/2019   Procedure: OPEN REDUCTION INTERNAL FIXATION (ORIF) DISTAL RADIAL FRACTURE;  Surgeon: Charlotte Crumb, MD;  Location: [facility] [place];  Service: Orthopedics;  Laterality: Left;  AXILLARY BLOCK    There were no vitals filed for this visit.  Subjective Assessment - 09/15/19 1357    Subjective   I can use my hand in all the activities - but that going back with my wrist and weight bearing that is stuck - it still hurts    Patient Stated Goals  I want to be able to use my  hand and wrist like before - so I can go back and to my work    Currently in Pain?  Yes    Pain Score  3     Pain Location  Wrist    Pain Descriptors / Indicators  Aching    Pain Onset  More than a month ago    Aggravating Factors   wrist  extention AROM and weight bearing          [facility] OT Assessment - 09/15/19 0001      AROM   Right Wrist Extension  70 Degrees    Right Wrist Flexion  78 Degrees      Strength   Right Hand Grip (lbs)  85    Right Hand Lateral Pinch  30 lbs    Right Hand 3 Point Pinch  21 lbs    Left Hand Grip (lbs)  70    Left Hand Lateral Pinch  21 lbs    Left Hand 3 Point Pinch  18 lbs      Pt progressed this past week in grip and prehension in L hand -  Pt using hand in all activities- but still bothered by wrist extention  And push up on wall            OT Treatments/Exercises (OP) - 09/15/19 0001      LUE Paraffin   Number Minutes Paraffin  8 Minutes    LUE Paraffin Location  Wrist    Comments  wrist flexion and ext stretch        done some graston tool nr 2 sweeping over volar and dorsal wrist - done PROM wrist flexion  And then wrist extention - done table slides - working on 70-90 degrees of AAROM   pain less than 3/10 - with more ease per pt after moist heat   pt to do this HEP  And will reassess in 10 day - if more comfort with weight bearing thru palm       OT Education - 09/15/19 1401    Education Details  wrist extention stretches    Person(s) Educated  Patient    Methods  Explanation;Demonstration;Tactile cues;Verbal cues;Handout    Comprehension  Verbal cues required;Returned demonstration;Verbalized understanding       OT Short Term Goals - 08/23/19 1046      OT SHORT TERM GOAL #1   Title  Pt to be independing in HEP to increase digits flexion to touching palm and gripping tools    Status  Achieved      OT SHORT TERM GOAL #2   Title  L supination/pronation improve to WNL to turn doorknob and get change in hand    Baseline  pro, sup 90 and strength improving - but cannot get change out of hand , opposition to all digits at same time - decrease strength with twisting knob ,    Time  3    Period  Weeks    Status  On-going    Target Date  09/13/19        OT Long Term Goals - 08/23/19  1047      OT LONG TERM GOAL #1   Title  L wrist AROM improve to [facility] to using hand in more than 60% of act on PRWHE    Status  Achieved      OT LONG TERM GOAL #2   Title  L grip strength and prehension strength increase to more than 50% compare to R hand to carry and grip ADL's act of about 5 lbs    Baseline  AT EVAL , Grip L 15, R 85 lbs, Lat grip R 28 ,L 8 lbs  ; 3 point grip R 19 , L 7 lbs; NOW Grip 56lbsL, R 85, L 20 lbs and R point 10 lbs- carry 4 lbs without discomfort    Time  4    Period  Weeks    Status  On-going    Target Date  09/20/19      OT LONG TERM GOAL #3   Title  PRWHE pain score improve with more than 25 point    Baseline  score on PRWHE for pain at eval 32/50 at [facility] ; and now 12/50    Time  4    Period  Weeks    Status  On-going    Target Date  09/20/19            Plan - 09/15/19 1401    Clinical Impression Statement  Pt did show increase grip and prehension strength this past week - pt to cont with putty - but mostly complain of wrist extention end range causing some pain - and weight bearing - add some table slides after heat for pt to work on 47 - 90 degrees AAROM the next 10 days    OT Occupational Profile and History  Problem Focused Assessment - Including review of records relating to presenting problem    Occupational performance deficits (Please refer to evaluation for details):  ADL's;IADL's;Work;Play;Leisure;Social Participation    Body Structure / Function / Physical Skills  ADL;ROM;UE functional use;Scar mobility;FMC;Dexterity;Pain;Strength;IADL    Rehab Potential  Good  Clinical Decision Making  Limited treatment options, no task modification necessary    Comorbidities Affecting Occupational Performance:  None    Modification or Assistance to Complete Evaluation   No modification of tasks or assist necessary to complete eval    OT Frequency  1x / week    OT Treatment/Interventions  Self-care/ADL training;Therapeutic exercise;Patient/family  education;Splinting;Paraffin;Fluidtherapy;Contrast Bath;Manual Therapy;Passive range of motion;Scar mobilization    Plan  assess progress in grip and prhension strength and wrist extention    OT Home Exercise Plan  see pt instruction    Consulted and Agree with Plan of Care  Patient       Patient will benefit from skilled therapeutic intervention in order to improve the following deficits and impairments:   Body Structure / Function / Physical Skills: ADL, ROM, UE functional use, Scar mobility, FMC, Dexterity, Pain, Strength, IADL       Visit Diagnosis: Stiffness of left wrist, not elsewhere classified  Pain in left wrist    Problem List Patient Active Problem List   Diagnosis Date Noted  . Attention deficit hyperactivity disorder (ADHD) 06/10/2019  . Pain from implanted hardware 09/15/2017  . Closed displaced fracture of lateral malleolus of right fibula 08/20/2016  . Closed displaced spiral fracture of shaft of right tibia 08/04/2016  . Anxiety, generalized 03/31/2015  . ADD (attention deficit disorder) 03/31/2015    Rosalyn Gess OTR/L,CLT 09/15/2019, 2:25 PM  [facility] [address] [place], [address] Phone: [phone]   Fax:  [phone]  Name: Zahn Shaak MRN: [id number] Date of Birth: 28-Oct-1976

## 2019-09-15 NOTE — Patient Instructions (Signed)
Work on table slides for wrist extention - from 70 to 90 degrees with slight pull  - after moist heat - 2-3 x day 15-20 reps  And cont with grip and prehension strength

## 2019-09-15 NOTE — Therapy (Signed)
[facility] [address], [place], [address] Phone: [phone]   Fax:  [phone]  Physical Therapy Treatment  Patient Details  Name: Joel Burgess MRN: [id number] Date of Birth: [birth year] Referring Provider (PT): Burney Gauze MD   Encounter Date: 09/15/2019  PT End of Session - 09/15/19 1442    Visit Number  9    Number of Visits  13    Date for PT Re-Evaluation  09/29/19    PT Start Time  [id number]    PT Stop Time  [id number]    PT Time Calculation (min)  30 min    Activity Tolerance  Patient tolerated treatment well;No increased pain    Behavior During Therapy  WFL for tasks assessed/performed       Past Medical History:  Diagnosis Date  . ADHD (attention deficit hyperactivity disorder)   . Anxiety     Past Surgical History:  Procedure Laterality Date  . APPENDECTOMY    . CARPAL TUNNEL RELEASE Left 04/23/2019   Procedure: LEFT CARPAL TUNNEL RELEASE;  Surgeon: Charlotte Crumb, MD;  Location: [facility];  Service: Orthopedics;  Laterality: Left;  . KNEE SURGERY Right   . OPEN REDUCTION INTERNAL FIXATION (ORIF) DISTAL RADIAL FRACTURE Left 04/23/2019   Procedure: OPEN REDUCTION INTERNAL FIXATION (ORIF) DISTAL RADIAL FRACTURE;  Surgeon: Charlotte Crumb, MD;  Location: [facility];  Service: Orthopedics;  Laterality: Left;  AXILLARY BLOCK    There were no vitals filed for this visit.  Subjective Assessment - 09/15/19 1335    Subjective  Patient reports he has had not had as much pain with reaching. Patient states he feels like his shoulder is improving in strength.    Patient Stated Goals  To improve shoulder flexion    Currently in Pain?  No/denies    Pain Onset  More than a month ago       TREATMENT Therapeutic Exercise Shoulder reaches horizontal adduction/abduction - x 20 Chest fly's in supine -- #3 - x 25 ; #4 - x 20 Shoulder posterior capsule stretch in standing - x 20   Shoulder abduction in standing reaching behind with use of doorway - x 20  Box lifts in standing with 20# box - x 20  Performed exercises to improve shoulder strength and improve pain free AROM  PT Education - 09/15/19 1441    Education Details  form/technique with exercise    Person(s) Educated  Patient    Methods  Explanation;Demonstration    Comprehension  Verbalized understanding;Returned demonstration          PT Long Term Goals - 08/18/19 1604      PT LONG TERM GOAL #1   Title  Pt will be independent with HEP in order to improve strength and balance in order to  improve function at home and work.     Baseline  Dependent with exercise    Time  6    Period  Weeks    Status  New      PT LONG TERM GOAL #2   Title  Patient will be able to perform shoulder flexion throuhgout full AROM without any increase in pain to return to occupational related tasks.    Baseline  Painful Arc between 100 and 150 degrees    Time  6    Period  Weeks    Status  New      PT LONG TERM GOAL #3   Title  Patient  will improve QuickDASH to under 10% to indicate signficant improvement in pain and spasms and UE function to better be able to perform work related tasks.    Baseline  70% QuickDASH    Time  6    Period  Weeks    Status  New      PT LONG TERM GOAL #4   Title  Patient will score equally on his Apley Scratch test Bilaterally in each direction behind the back and behind the head to better be able to clean his back.    Baseline  L shoulder: Behind the back: L3, Behind the head: T1; R shoulder: Behind the back: T6, Behind the head T6    Time  6    Period  Weeks    Status  New            Plan - 09/15/19 1442    Clinical Impression Statement  Continued to progress patient's strength as he continues to increased weakness and guarding along the pec musculature most notably along the pec major. Started to address shoulder weakness with lifting a 20# box today and patient is able to  perform with increased stiffness. Although patient is improvng, he continues to have limitations in strength. Patient will benefit from further skilled threapy to return to prior level of function.    Personal Factors and Comorbidities  Comorbidity 2    Comorbidities  wrist/hand pain    Examination-Activity Limitations  Lift    Examination-Participation Restrictions  Cleaning    Stability/Clinical Decision Making  Stable/Uncomplicated    Rehab Potential  Good    PT Frequency  2x / week    PT Duration  6 weeks    PT Treatment/Interventions  Cryotherapy;Electrical Stimulation;Moist Heat;Functional mobility training;Therapeutic activities;Therapeutic exercise;Neuromuscular re-education;Patient/family education;Manual techniques;Dry needling;Passive range of motion;Spinal Manipulations;Joint Manipulations    PT Next Visit Plan  Progress strenghtening and coordination    PT Home Exercise Plan  See education section    Consulted and Agree with Plan of Care  Patient       Patient will benefit from skilled therapeutic intervention in order to improve the following deficits and impairments:  Decreased mobility, Increased muscle spasms, Hypomobility, Decreased range of motion, Decreased coordination, Decreased strength, Pain  Visit Diagnosis: Muscle weakness (generalized)  Stiffness of left shoulder, not elsewhere classified     Problem List Patient Active Problem List   Diagnosis Date Noted  . Attention deficit hyperactivity disorder (ADHD) 06/10/2019  . Pain from implanted hardware 09/15/2017  . Closed displaced fracture of lateral malleolus of right fibula 08/20/2016  . Closed displaced spiral fracture of shaft of right tibia 08/04/2016  . Anxiety, generalized 03/31/2015  . ADD (attention deficit disorder) 03/31/2015    Blythe Stanford, PT DPT 09/15/2019, 2:46 PM  [facility] [address], [place],  [address] Phone: [phone]   Fax:  [phone]  Name: Elih Bennette MRN: [id number] Date of Birth: [birth year]

## 2019-09-22 ENCOUNTER — Ambulatory Visit: Payer: Self-pay

## 2019-09-22 ENCOUNTER — Other Ambulatory Visit: Payer: Self-pay

## 2019-09-22 DIAGNOSIS — M6281 Muscle weakness (generalized): Secondary | ICD-10-CM

## 2019-09-22 DIAGNOSIS — M25612 Stiffness of left shoulder, not elsewhere classified: Secondary | ICD-10-CM

## 2019-09-22 NOTE — Therapy (Signed)
[facility] [address], [place], [address] Phone: [phone]   Fax:  [phone]  Physical Therapy Treatment  Patient Details  Name: Joel Burgess MRN: [id number] Date of Birth: [birth year] Referring Provider (PT): Burney Gauze MD   Encounter Date: 09/22/2019  PT End of Session - 09/22/19 1818    Visit Number  10    Number of Visits  13    Date for PT Re-Evaluation  09/29/19    PT Start Time  1600    PT Stop Time  [id number]    PT Time Calculation (min)  45 min    Activity Tolerance  Patient tolerated treatment well;No increased pain    Behavior During Therapy  WFL for tasks assessed/performed       Past Medical History:  Diagnosis Date  . ADHD (attention deficit hyperactivity disorder)   . Anxiety     Past Surgical History:  Procedure Laterality Date  . APPENDECTOMY    . CARPAL TUNNEL RELEASE Left 04/23/2019   Procedure: LEFT CARPAL TUNNEL RELEASE;  Surgeon: Charlotte Crumb, MD;  Location: [facility];  Service: Orthopedics;  Laterality: Left;  . KNEE SURGERY Right   . OPEN REDUCTION INTERNAL FIXATION (ORIF) DISTAL RADIAL FRACTURE Left 04/23/2019   Procedure: OPEN REDUCTION INTERNAL FIXATION (ORIF) DISTAL RADIAL FRACTURE;  Surgeon: Charlotte Crumb, MD;  [facility];  Service: Orthopedics;  Laterality: Left;  AXILLARY BLOCK    There were no vitals filed for this visit.  Subjective Assessment - 09/22/19 1812    Subjective  Patient states he is having difficulty with lifitng items overhead and would like to address this limitation today.    Patient Stated Goals  To improve shoulder flexion    Currently in Pain?  No/denies    Pain Score  0-No pain    Pain Onset  More than a month ago        TREATMENT Therapeutic Exercise Overhead shoulder circles with RTB - x 25 Box lifts in standing with 30# box - x 20 ; 40# -- x15 Lateral raises in standing - x 10 7# Push  ups on the wall - x 20  Shoulder flexion in standing - 3 x 10   Performed exercises to improve shoulder strength and improve pain free AROM  PT Education - 09/22/19 1818    Education Details  form/technique with exercise    Person(s) Educated  Patient    Methods  Explanation;Demonstration    Comprehension  Verbalized understanding;Returned demonstration          PT Long Term Goals - 08/18/19 1604      PT LONG TERM GOAL #1   Title  Pt will be independent with HEP in order to improve strength and balance in order to  improve function at home and work.     Baseline  Dependent with exercise    Time  6    Period  Weeks    Status  New      PT LONG TERM GOAL #2   Title  Patient will be able to perform shoulder flexion throuhgout full AROM without any increase in pain to return to occupational related tasks.    Baseline  Painful Arc between 100 and 150 degrees    Time  6    Period  Weeks    Status  New      PT LONG TERM GOAL #3   Title  Patient will improve QuickDASH  to under 10% to indicate signficant improvement in pain and spasms and UE function to better be able to perform work related tasks.    Baseline  70% QuickDASH    Time  6    Period  Weeks    Status  New      PT LONG TERM GOAL #4   Title  Patient will score equally on his Apley Scratch test Bilaterally in each direction behind the back and behind the head to better be able to clean his back.    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 Patient demonstrates improvement with lifting ability during today's session with ability to lift a 40# box. Although patient is improving, he continues to have limitations  most specifically in shoulder abduction and is unable to lift 7# overhead without significant compensations. Patient will benefit from further skilled therapy focused on improving strengthening to return to prior level of function.    Personal Factors and Comorbidities  Comorbidity 2    Comorbidities  wrist/hand pain    Examination-Activity Limitations  Lift    Examination-Participation Restrictions  Cleaning    Stability/Clinical Decision Making  Stable/Uncomplicated    Rehab Potential  Good    PT Frequency  2x / week    PT Duration  6 weeks    PT Treatment/Interventions  Cryotherapy;Electrical Stimulation;Moist Heat;Functional mobility training;Therapeutic activities;Therapeutic exercise;Neuromuscular re-education;Patient/family education;Manual techniques;Dry needling;Passive range of motion;Spinal Manipulations;Joint Manipulations    PT Next Visit Plan  Progress strenghtening and coordination    PT Home Exercise Plan  See education section    Consulted and Agree with Plan of Care  Patient       Patient will benefit from skilled therapeutic intervention in order to improve the following deficits and impairments:  Decreased mobility, Increased muscle spasms, Hypomobility, Decreased range of motion, Decreased coordination, Decreased strength, Pain  Visit Diagnosis: Muscle weakness (generalized)  Stiffness of left shoulder, not elsewhere classified     Problem List Patient Active Problem List   Diagnosis Date Noted  . Attention deficit hyperactivity disorder (ADHD) 06/10/2019  . Pain from implanted hardware 09/15/2017  . Closed displaced fracture of lateral malleolus of right fibula 08/20/2016  . Closed displaced spiral fracture of shaft of right tibia 08/04/2016  . Anxiety, generalized 03/31/2015  . ADD (attention deficit disorder) 03/31/2015    Blythe Stanford, PT DPT 09/22/2019, 6:54 PM  [facility] [address], [place], [address] Phone: [phone]   Fax:  [phone]  Name: Joel Burgess MRN: [id number] Date of Birth: 10-26-1976

## 2019-09-23 ENCOUNTER — Ambulatory Visit: Payer: Self-pay | Attending: Orthopedic Surgery

## 2019-09-23 DIAGNOSIS — M25642 Stiffness of left hand, not elsewhere classified: Secondary | ICD-10-CM | POA: Insufficient documentation

## 2019-09-23 DIAGNOSIS — M25512 Pain in left shoulder: Secondary | ICD-10-CM | POA: Insufficient documentation

## 2019-09-23 DIAGNOSIS — M25612 Stiffness of left shoulder, not elsewhere classified: Secondary | ICD-10-CM | POA: Insufficient documentation

## 2019-09-23 DIAGNOSIS — M25532 Pain in left wrist: Secondary | ICD-10-CM | POA: Insufficient documentation

## 2019-09-23 DIAGNOSIS — M6281 Muscle weakness (generalized): Secondary | ICD-10-CM | POA: Insufficient documentation

## 2019-09-23 DIAGNOSIS — M25632 Stiffness of left wrist, not elsewhere classified: Secondary | ICD-10-CM | POA: Insufficient documentation

## 2019-09-23 DIAGNOSIS — M62838 Other muscle spasm: Secondary | ICD-10-CM | POA: Insufficient documentation

## 2019-09-23 NOTE — Therapy (Signed)
[facility] [address], [place], [address] Phone: [phone]   Fax:  [phone]  Physical Therapy Treatment  Patient Details  Name: Joel Burgess MRN: [id number] Date of Birth: 1976/08/02 Referring Provider (PT): Burney Gauze MD   Encounter Date: 09/23/2019  PT End of Session - 09/23/19 0737    Visit Number  11    Number of Visits  13    Date for PT Re-Evaluation  09/29/19    PT Start Time  0732    PT Stop Time  0815    PT Time Calculation (min)  43 min    Activity Tolerance  Patient tolerated treatment well;No increased pain    Behavior During Therapy  WFL for tasks assessed/performed       Past Medical History:  Diagnosis Date  . ADHD (attention deficit hyperactivity disorder)   . Anxiety     Past Surgical History:  Procedure Laterality Date  . APPENDECTOMY    . CARPAL TUNNEL RELEASE Left 04/23/2019   Procedure: LEFT CARPAL TUNNEL RELEASE;  Surgeon: Charlotte Crumb, MD;  Location: [facility];  Service: Orthopedics;  Laterality: Left;  . KNEE SURGERY Right   . OPEN REDUCTION INTERNAL FIXATION (ORIF) DISTAL RADIAL FRACTURE Left 04/23/2019   Procedure: OPEN REDUCTION INTERNAL FIXATION (ORIF) DISTAL RADIAL FRACTURE;  Surgeon: Charlotte Crumb, MD;  Location: [facility];  Service: Orthopedics;  Laterality: Left;  AXILLARY BLOCK    There were no vitals filed for this visit.  Subjective Assessment - 09/23/19 0736    Subjective  Patient reports increased soreness along his affected shoulder today after the previous session; no changes otherwise.    Patient Stated Goals  To improve shoulder flexion    Currently in Pain?  No/denies    Pain Onset  More than a month ago       TREATMENT Therapeutic Exercise B ER with arms at side - 2 x 20 with GTB B ER with arms overhead - 2 x 20 with GTB B Shoulder abduction - 2 x 20 GTB Shoulder extension in standing - 2 x 20  GTB Scapular retraction in standing - 2 x 30 GTB Shoulder horizontal abduction - 3 x 30 Shoulder circles in standing - 3 x 30sec cw/ccw Shoulder abduction in standing - x 20   Performed exercises to improve strength and muscular endurance     PT Education - 09/23/19 0737    Education Details  form/technique with exercise    Person(s) Educated  Patient    Methods  Explanation;Demonstration    Comprehension  Verbalized understanding;Returned demonstration          PT Long Term Goals - 08/18/19 1604      PT LONG TERM GOAL #1   Title  Pt will be independent with HEP in order to improve strength and balance in order to  improve function at home and work.     Baseline  Dependent with exercise    Time  6    Period  Weeks    Status  New      PT LONG TERM GOAL #2   Title  Patient will be able to perform shoulder flexion throuhgout full AROM without any increase in pain to return to occupational related tasks.    Baseline  Painful Arc between 100 and 150 degrees    Time  6    Period  Weeks    Status  New  PT LONG TERM GOAL #3   Title  Patient will improve QuickDASH to under 10% to indicate signficant improvement in pain and spasms and UE function to better be able to perform work related tasks.    Baseline  70% QuickDASH    Time  6    Period  Weeks    Status  New      PT LONG TERM GOAL #4   Title  Patient will score equally on his Apley Scratch test Bilaterally in each direction behind the back and behind the head to better be able to clean his back.    Baseline  L shoulder: Behind the back: L3, Behind the head: T1; R shoulder: Behind the back: T6, Behind the head T6    Time  6    Period  Weeks    Status  New            Plan - 09/23/19 0739    Clinical Impression Statement  Focused on improving shoulder ER/IR during today's session. Focused on improving periscapular stabilization with exercises today and addressed endurance concerns as patient will have to hold  parts overhead for prolonged periods of time. Patient demonstrates increased fatigue, most notably with performing exercises increased shoulder abduction indicating deltoid weakness. Patient is improving overall and will benefit from further skilled therapy to return to prior level of function.    Personal Factors and Comorbidities  Comorbidity 2    Comorbidities  wrist/hand pain    Examination-Activity Limitations  Lift    Examination-Participation Restrictions  Cleaning    Stability/Clinical Decision Making  Stable/Uncomplicated    Rehab Potential  Good    PT Frequency  2x / week    PT Duration  6 weeks    PT Treatment/Interventions  Cryotherapy;Electrical Stimulation;Moist Heat;Functional mobility training;Therapeutic activities;Therapeutic exercise;Neuromuscular re-education;Patient/family education;Manual techniques;Dry needling;Passive range of motion;Spinal Manipulations;Joint Manipulations    PT Next Visit Plan  Progress strenghtening and coordination    PT Home Exercise Plan  See education section    Consulted and Agree with Plan of Care  Patient       Patient will benefit from skilled therapeutic intervention in order to improve the following deficits and impairments:  Decreased mobility, Increased muscle spasms, Hypomobility, Decreased range of motion, Decreased coordination, Decreased strength, Pain  Visit Diagnosis: Muscle weakness (generalized)  Stiffness of left shoulder, not elsewhere classified     Problem List Patient Active Problem List   Diagnosis Date Noted  . Attention deficit hyperactivity disorder (ADHD) 06/10/2019  . Pain from implanted hardware 09/15/2017  . Closed displaced fracture of lateral malleolus of right fibula 08/20/2016  . Closed displaced spiral fracture of shaft of right tibia 08/04/2016  . Anxiety, generalized 03/31/2015  . ADD (attention deficit disorder) 03/31/2015    Blythe Stanford, PT DPT 09/23/2019, 8:17 AM  [facility] [address] [place], [address] Phone: [phone]   Fax:  [phone]  Name: Joel Burgess MRN: [id number] Date of Birth: [birth year]

## 2019-09-28 ENCOUNTER — Other Ambulatory Visit: Payer: Self-pay

## 2019-09-28 ENCOUNTER — Ambulatory Visit: Payer: Self-pay

## 2019-09-28 ENCOUNTER — Ambulatory Visit: Payer: Self-pay | Admitting: Occupational Therapy

## 2019-09-28 DIAGNOSIS — M6281 Muscle weakness (generalized): Secondary | ICD-10-CM

## 2019-09-28 DIAGNOSIS — M25612 Stiffness of left shoulder, not elsewhere classified: Secondary | ICD-10-CM

## 2019-09-28 DIAGNOSIS — M25632 Stiffness of left wrist, not elsewhere classified: Secondary | ICD-10-CM

## 2019-09-28 DIAGNOSIS — M25532 Pain in left wrist: Secondary | ICD-10-CM

## 2019-09-28 DIAGNOSIS — M25642 Stiffness of left hand, not elsewhere classified: Secondary | ICD-10-CM

## 2019-09-28 NOTE — Therapy (Signed)
[facility] [address] [place], [address] Phone: [phone]   Fax:  [phone]  Physical Therapy Treatment  Patient Details  Name: Joel Burgess MRN: [id number] Date of Birth: 1976-01-27 Referring Provider (PT): Burney Gauze MD   Encounter Date: 09/28/2019  PT End of Session - 09/28/19 0911    Visit Number  12    Number of Visits  13    Date for PT Re-Evaluation  09/29/19    PT Start Time  0905    PT Stop Time  0945    PT Time Calculation (min)  40 min    Activity Tolerance  Patient tolerated treatment well;No increased pain    Behavior During Therapy  WFL for tasks assessed/performed       Past Medical History:  Diagnosis Date  . ADHD (attention deficit hyperactivity disorder)   . Anxiety     Past Surgical History:  Procedure Laterality Date  . APPENDECTOMY    . CARPAL TUNNEL RELEASE Left 04/23/2019   Procedure: LEFT CARPAL TUNNEL RELEASE;  Surgeon: Charlotte Crumb, MD;  Location: [facility];  Service: Orthopedics;  Laterality: Left;  . KNEE SURGERY Right   . OPEN REDUCTION INTERNAL FIXATION (ORIF) DISTAL RADIAL FRACTURE Left 04/23/2019   Procedure: OPEN REDUCTION INTERNAL FIXATION (ORIF) DISTAL RADIAL FRACTURE;  Surgeon: Charlotte Crumb, MD;  Location: [facility];  Service: Orthopedics;  Laterality: Left;  AXILLARY BLOCK    There were no vitals filed for this visit.  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    Patient Stated Goals  To improve shoulder flexion    Currently in Pain?  No/denies    Pain Onset  More than a month ago          TREATMENT Therapeutic Exercise Shoulder circles in standing - 3 x 30sec cw/ccw Box lifts in standing putting on raised surface - x 20 50# Shoulder presses with upsidedown KB  - x 8 , x 6 20#  Shoulder lateral raises with DBs - 2x10 8#  Forward raises with DBs - 2 x 10 8# Overhead shoulder flexion - 15 x 3   Performed exercises to improve strength and muscular endurance   PT Education - 09/28/19 0910    Education Details  form/technique with exercise    Person(s) Educated  Patient    Methods  Demonstration;Explanation    Comprehension  Verbalized understanding;Returned demonstration          PT Long Term Goals - 08/18/19 1604      PT LONG TERM GOAL #1   Title  Pt will be independent with HEP in order to improve strength and balance in order to  improve function at home and work.     Baseline  Dependent with exercise    Time  6    Period  Weeks    Status  New      PT LONG TERM GOAL #2   Title  Patient will be able to perform shoulder flexion throuhgout full AROM without any increase in pain to return to occupational related tasks.    Baseline  Painful Arc between 100 and 150 degrees    Time  6    Period  Weeks    Status  New      PT LONG TERM GOAL #  3   Title  Patient will improve QuickDASH to under 10% to indicate signficant improvement in pain and spasms and UE function to better be able to perform work related tasks.    Baseline  70% QuickDASH    Time  6    Period  Weeks    Status  New      PT LONG TERM GOAL #4   Title  Patient will score equally on his Apley Scratch test Bilaterally in each direction behind the back and behind the head to better be able to clean his back.    Baseline  L shoulder: Behind the back: L3, Behind the head: T1; R shoulder: Behind the back: T6, Behind the head T6    Time  6    Period  Weeks    Status  New            Plan - 09/28/19 [id number]    Clinical Impression Statement  Continued to address strengthening deficits today as patient will need to carry and lift heavy items at home. Patient is improving overall with ability to lift heavier weights however he demonstrates poor endurance with lifting heavy  weights for long peroids of time limiting ability to work under cars. Patient will benefit from further skilled therapy to improve strength and return to prior level of function.    Personal Factors and Comorbidities  Comorbidity 2    Comorbidities  wrist/hand pain    Examination-Activity Limitations  Lift    Examination-Participation Restrictions  Cleaning    Stability/Clinical Decision Making  Stable/Uncomplicated    Rehab Potential  Good    PT Frequency  2x / week    PT Duration  6 weeks    PT Treatment/Interventions  Cryotherapy;Electrical Stimulation;Moist Heat;Functional mobility training;Therapeutic activities;Therapeutic exercise;Neuromuscular re-education;Patient/family education;Manual techniques;Dry needling;Passive range of motion;Spinal Manipulations;Joint Manipulations    PT Next Visit Plan  Progress strenghtening and coordination    PT Home Exercise Plan  See education section    Consulted and Agree with Plan of Care  Patient       Patient will benefit from skilled therapeutic intervention in order to improve the following deficits and impairments:  Decreased mobility, Increased muscle spasms, Hypomobility, Decreased range of motion, Decreased coordination, Decreased strength, Pain  Visit Diagnosis: Muscle weakness (generalized)  Stiffness of left shoulder, not elsewhere classified     Problem List Patient Active Problem List   Diagnosis Date Noted  . Attention deficit hyperactivity disorder (ADHD) 06/10/2019  . Pain from implanted hardware 09/15/2017  . Closed displaced fracture of lateral malleolus of right fibula 08/20/2016  . Closed displaced spiral fracture of shaft of right tibia 08/04/2016  . Anxiety, generalized 03/31/2015  . ADD (attention deficit disorder) 03/31/2015    Blythe Stanford, PT DPT 09/28/2019, 9:50 AM  [facility] [address], [place], [address] Phone: [phone]    Fax:  [phone]  Name: Mican Coby MRN: [id number] Date of Birth: 1976-11-24

## 2019-09-28 NOTE — Patient Instructions (Signed)
Cont to do some heat -and then work on end range wrist extention -table slides and wall slides  And wrist flexion stretch

## 2019-09-28 NOTE — Therapy (Signed)
[facility] [address] [place], [address] Phone: [phone]   Fax:  [phone]  Occupational Therapy Treatment  Patient Details  Name: Joel Burgess MRN: [id number] Date of Birth: [birth year] Referring Provider (OT): Burney Gauze   Encounter Date: 09/28/2019  OT End of Session - 09/28/19 0851    Visit Number  13    Number of Visits  13    Date for OT Re-Evaluation  09/28/19    OT Start Time  0818    OT Stop Time  0843    OT Time Calculation (min)  25 min    Activity Tolerance  Patient tolerated treatment well    Behavior During Therapy  [facility] for tasks assessed/performed       Past Medical History:  Diagnosis Date  . ADHD (attention deficit hyperactivity disorder)   . Anxiety     Past Surgical History:  Procedure Laterality Date  . APPENDECTOMY    . CARPAL TUNNEL RELEASE Left 04/23/2019   Procedure: LEFT CARPAL TUNNEL RELEASE;  Surgeon: Charlotte Crumb, MD;  Location: [place];  Service: Orthopedics;  Laterality: Left;  . KNEE SURGERY Right   . OPEN REDUCTION INTERNAL FIXATION (ORIF) DISTAL RADIAL FRACTURE Left 04/23/2019   Procedure: OPEN REDUCTION INTERNAL FIXATION (ORIF) DISTAL RADIAL FRACTURE;  Surgeon: Charlotte Crumb, MD;  Location: [place];  Service: Orthopedics;  Laterality: Left;  AXILLARY BLOCK    There were no vitals filed for this visit.  Subjective Assessment - 09/28/19 0819    Subjective   I was able to change ceiling fan and hold it up with my shoulder and hand without pain - cannot push up or push something heavy - then feel pull in my wrist - about 3/10 -but otherwise can do everything else    Patient Stated Goals  I want to be able to use my  hand and wrist like before - so I can go back and to my work    Currently in Pain?  Yes    Pain Score  3     Pain Location  Wrist    Pain Orientation  Left    Pain Descriptors / Indicators  Aching    Pain  Type  Surgical pain    Pain Onset  More than a month ago    Aggravating Factors   wrist extention AAROM < PROM - pushup         [facility] OT Assessment - 09/28/19 0001      AROM   Right Wrist Extension  70 Degrees    Right Wrist Flexion  78 Degrees    Right Wrist Radial Deviation  28 Degrees    Right Wrist Ulnar Deviation  32 Degrees    Left Wrist Extension  70 Degrees    Left Wrist Flexion  80 Degrees    Left Wrist Radial Deviation  28 Degrees    Left Wrist Ulnar Deviation  32 Degrees      Strength   Right Hand Grip (lbs)  85    Right Hand Lateral Pinch  26 lbs    Right Hand 3 Point Pinch  24 lbs    Left Hand Grip (lbs)  70    Left Hand Lateral Pinch  23 lbs    Left Hand 3 Point Pinch  20 lbs       Pt made great progress from [facility]-  Cont to report discomfort in volar wrist-  But per  pt " feels inside of wrist"  When doing end range wrist extention , AAROM and PROM  Also discomfort with weight bearing    Pt to cont to work on wrist extention 70-90 degrees - with table slides and wall slides - after using some heat  Also to cont with wrist flexion stretch in shower   Pt report using hand in all activities at home with no pain or problems- cutting with utensils   carry 10 lbs - but can place 8 lbs on shelf - but difficulty with 9 lbs at shoulder  Can push and pull heavy door open in clinic   Pt to see surgeon Thursday  plan to discharge pt from OT/hand therapy if surgeon in agreement  PT addressing pt's L shoulder issue                  OT Education - 09/28/19 0851    Education Details  discharge instruction for wrist ext and flexion stretches    Person(s) Educated  Patient    Methods  Explanation;Demonstration;Tactile cues;Verbal cues;Handout    Comprehension  Verbal cues required;Returned demonstration;Verbalized understanding       OT Short Term Goals - 09/28/19 0856      OT SHORT TERM GOAL #1   Title  Pt to be independing in HEP to increase digits  flexion to touching palm and gripping tools    Status  Achieved      OT SHORT TERM GOAL #2   Title  L supination/pronation improve to WNL to turn doorknob and get change in hand    Status  Achieved        OT Long Term Goals - 09/28/19 0856      OT LONG TERM GOAL #1   Title  L wrist AROM improve to [facility] to using hand in more than 60% of act on PRWHE    Status  Achieved      OT LONG TERM GOAL #2   Title  L grip strength and prehension strength increase to more than 50% compare to R hand to carry and grip ADL's act of about 5 lbs    Status  Achieved      OT LONG TERM GOAL #3   Title  PRWHE pain score improve with more than 25 point    Status  Achieved            Plan - 09/28/19 0852    Clinical Impression Statement  Pt made from [facility] with OT great progress in AROM in L wrist and digits, grip and prehension strength - and scar tissue - pt cont to show increase functional use - and this date able to carry 10 lbs with no issues - and place 8 lbs on shelf wiht ease - but shoulder bother him with 9 lbs - the only thing pt still report issues with is pushing thru wrist past 70 to 90 degrees with weight bearing and AAROM - 3/10 - and per pt it do not feel like soft tissue - but inside  - pt to discuss with surgeon- plan to discharge pt if surgeon in agreement    OT Occupational Profile and History  Problem Focused Assessment - Including review of records relating to presenting problem    Occupational performance deficits (Please refer to evaluation for details):  ADL's;IADL's;Work;Play;Leisure;Social Participation    Body Structure / Function / Physical Skills  ADL;ROM;UE functional use;Scar mobility;FMC;Dexterity;Pain;Strength;IADL    Rehab Potential  Good  Clinical Decision Making  Limited treatment options, no task modification necessary    Comorbidities Affecting Occupational Performance:  None    Modification or Assistance to Complete Evaluation   No modification of tasks or assist  necessary to complete eval    OT Treatment/Interventions  Self-care/ADL training;Therapeutic exercise;Patient/family education;Splinting;Paraffin;Fluidtherapy;Contrast Bath;Manual Therapy;Passive range of motion;Scar mobilization    Plan  pt to see surgeon - plan to discharge if surgeon in agreement    OT Home Exercise Plan  see pt instruction    Consulted and Agree with Plan of Care  Patient       Patient will benefit from skilled therapeutic intervention in order to improve the following deficits and impairments:   Body Structure / Function / Physical Skills: ADL, ROM, UE functional use, Scar mobility, FMC, Dexterity, Pain, Strength, IADL       Visit Diagnosis: Muscle weakness (generalized) - Plan: Ot plan of care cert/re-cert  Stiffness of left wrist, not elsewhere classified - Plan: Ot plan of care cert/re-cert  Pain in left wrist - Plan: Ot plan of care cert/re-cert  Stiffness of left hand, not elsewhere classified - Plan: Ot plan of care cert/re-cert    Problem List Patient Active Problem List   Diagnosis Date Noted  . Attention deficit hyperactivity disorder (ADHD) 06/10/2019  . Pain from implanted hardware 09/15/2017  . Closed displaced fracture of lateral malleolus of right fibula 08/20/2016  . Closed displaced spiral fracture of shaft of right tibia 08/04/2016  . Anxiety, generalized 03/31/2015  . ADD (attention deficit disorder) 03/31/2015    Rosalyn Gess OTR/L,CLT 09/28/2019, 9:23 AM  [place] [facility] [address] [place], [address] Phone: [phone]   Fax:  [phone]  Name: Joel Burgess MRN: [id number] Date of Birth: 1976/04/17

## 2019-09-30 ENCOUNTER — Ambulatory Visit: Payer: Self-pay

## 2019-09-30 ENCOUNTER — Other Ambulatory Visit: Payer: Self-pay

## 2019-09-30 DIAGNOSIS — M25512 Pain in left shoulder: Secondary | ICD-10-CM

## 2019-09-30 DIAGNOSIS — M62838 Other muscle spasm: Secondary | ICD-10-CM

## 2019-09-30 DIAGNOSIS — M25632 Stiffness of left wrist, not elsewhere classified: Secondary | ICD-10-CM

## 2019-09-30 DIAGNOSIS — M6281 Muscle weakness (generalized): Secondary | ICD-10-CM

## 2019-09-30 NOTE — Therapy (Signed)
[place] [facility] [address] [place], [address] Phone: [phone]   Fax:  [phone]  Physical Therapy Treatment  Patient Details  Name: Joel Burgess MRN: [id number] Date of Birth: Apr 22, 1976 Referring Provider (PT): Burney Gauze MD   Encounter Date: 09/30/2019  PT End of Session - 09/30/19 0805    Visit Number  13    Number of Visits  13    Date for PT Re-Evaluation  10/28/19    PT Start Time  0735    PT Stop Time  0815    PT Time Calculation (min)  40 min    Activity Tolerance  Patient tolerated treatment well;No increased pain    Behavior During Therapy  [facility] for tasks assessed/performed       Past Medical History:  Diagnosis Date  . ADHD (attention deficit hyperactivity disorder)   . Anxiety     Past Surgical History:  Procedure Laterality Date  . APPENDECTOMY    . CARPAL TUNNEL RELEASE Left 04/23/2019   Procedure: LEFT CARPAL TUNNEL RELEASE;  Surgeon: Charlotte Crumb, MD;  Location: [place];  Service: Orthopedics;  Laterality: Left;  . KNEE SURGERY Right   . OPEN REDUCTION INTERNAL FIXATION (ORIF) DISTAL RADIAL FRACTURE Left 04/23/2019   Procedure: OPEN REDUCTION INTERNAL FIXATION (ORIF) DISTAL RADIAL FRACTURE;  Surgeon: Charlotte Crumb, MD;  Location: [place];  Service: Orthopedics;  Laterality: Left;  AXILLARY BLOCK    There were no vitals filed for this visit.  Subjective Assessment - 09/30/19 0740    Subjective  Patient reports his shoulder is feeling stiff today. Patient is improving overall and state his shoulder is feeling stronger.    Patient Stated Goals  To improve shoulder flexion    Currently in Pain?  No/denies    Pain Onset  More than a month ago       TREATMENT Therapeutic Exercise Shoulder circles in standing - 3 x 30sec cw/ccw Biceps curls in standing - x 10 10#  Cross - body behind back Shoulder lateral raises with DBs - 2x10 9#   Forward raises with DBs - 2 x 10 10#, 9#  Overhead shoulder flexion - 15 x 3   Performed exercises to improve strength and muscular endurance   PT Education - 09/30/19 0753    Education Details  form/technique with exercise    Person(s) Educated  Patient    Methods  Explanation;Demonstration    Comprehension  Verbalized understanding;Returned demonstration          PT Long Term Goals - 09/30/19 1310      PT LONG TERM GOAL #1   Title  Pt will be independent with HEP in order to improve strength and balance in order to  improve function at home and work.     Baseline  Dependent with exercise; 09/30/2019: independent with HEP    Time  6    Period  Weeks    Status  Achieved      PT LONG TERM GOAL #2   Title  Patient will be able to perform shoulder flexion throuhgout full AROM without any increase in pain to return to occupational related tasks.    Baseline  Painful Arc between 100 and 150 degrees; 09/30/2019: no painful arc    Time  6    Period  Weeks    Status  Achieved      PT LONG TERM GOAL #3   Title  Patient will improve  QuickDASH to under 10% to indicate signficant improvement in pain and spasms and UE function to better be able to perform work related tasks.    Baseline  70% QuickDASH; 09/30/2019: 14%    Time  6    Period  Weeks    Status  On-going      PT LONG TERM GOAL #4   Title  Patient will score equally on his Apley Scratch test Bilaterally in each direction behind the back and behind the head to better be able to clean his back.    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    Baseline  09/30/2019: 9# overhead    Time  4    Period  Weeks    Status  New            Plan - 09/30/19 0805    Clinical Impression Statement  Patient is making progress towards long term goals with improvement in shoulder strength and painful AROM. 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 Patient will benefit from further skilled therapy focused on improving these limitations to return to prior level of function.    Personal Factors and Comorbidities  Comorbidity 2    Comorbidities  wrist/hand pain    Examination-Activity Limitations  Lift    Examination-Participation Restrictions  Cleaning    Stability/Clinical Decision Making  Stable/Uncomplicated    Rehab Potential  Good    PT Frequency  2x / week    PT Duration  6 weeks    PT Treatment/Interventions  Cryotherapy;Electrical Stimulation;Moist Heat;Functional mobility training;Therapeutic activities;Therapeutic exercise;Neuromuscular re-education;Patient/family education;Manual techniques;Dry needling;Passive range of motion;Spinal Manipulations;Joint Manipulations    PT Next Visit Plan  Progress strenghtening and coordination    PT Home Exercise Plan  See education section    Consulted and Agree with Plan of Care  Patient       Patient will benefit from skilled therapeutic intervention in order to improve the following deficits and impairments:  Decreased mobility, Increased muscle spasms, Hypomobility, Decreased range of motion, Decreased coordination, Decreased strength, Pain  Visit Diagnosis: Muscle weakness (generalized)  Stiffness of left wrist, not elsewhere classified  Acute pain of left shoulder  Other muscle spasm     Problem List Patient Active Problem List   Diagnosis Date Noted  . Attention deficit hyperactivity disorder (ADHD) 06/10/2019  .  Pain from implanted hardware 09/15/2017  . Closed displaced fracture of lateral malleolus of right fibula 08/20/2016  . Closed displaced spiral fracture of shaft of right tibia 08/04/2016  . Anxiety, generalized 03/31/2015  . ADD (attention deficit disorder) 03/31/2015    Blythe Stanford, PT DPT 09/30/2019, 1:14 PM  [place] [facility] [address] [place], [address] Phone: [phone]   Fax:  [phone]  Name: Joel Burgess MRN: [id number] Date of Birth: 07/31/1976

## 2019-10-06 ENCOUNTER — Ambulatory Visit: Payer: Self-pay

## 2019-10-07 ENCOUNTER — Other Ambulatory Visit: Payer: Self-pay | Admitting: Family Medicine

## 2019-10-11 ENCOUNTER — Ambulatory Visit: Payer: Self-pay

## 2019-11-23 ENCOUNTER — Ambulatory Visit (INDEPENDENT_AMBULATORY_CARE_PROVIDER_SITE_OTHER): Payer: Self-pay | Admitting: Psychiatry

## 2019-11-23 ENCOUNTER — Encounter: Payer: Self-pay | Admitting: Psychiatry

## 2019-11-23 ENCOUNTER — Other Ambulatory Visit: Payer: Self-pay

## 2019-11-23 DIAGNOSIS — F411 Generalized anxiety disorder: Secondary | ICD-10-CM

## 2019-11-23 DIAGNOSIS — F902 Attention-deficit hyperactivity disorder, combined type: Secondary | ICD-10-CM

## 2019-11-23 MED ORDER — ALPRAZOLAM 1 MG PO TABS: 1.0000 mg | ORAL_TABLET | Freq: Two times a day (BID) | ORAL | 2 refills | Status: DC

## 2019-11-23 MED ORDER — DIAZEPAM 10 MG PO TABS: 10.0000 mg | ORAL_TABLET | Freq: Every evening | ORAL | 2 refills | Status: DC | PRN

## 2019-11-23 MED ORDER — AMPHETAMINE-DEXTROAMPHETAMINE 30 MG PO TABS: 30.0000 mg | ORAL_TABLET | Freq: Two times a day (BID) | ORAL | 0 refills | Status: DC

## 2019-11-23 NOTE — Progress Notes (Signed)
Follow-up for this patient with ADHD and anxiety.  Spoke to the patient on the telephone.  Confirmed his identity and confirmed the appointment.  Patient had no new complaints.  His physical therapy for his arm pain and carpal tunnel has proceeded effectively and he is back at work although still has some restrictions and what he can lift.  His mood has been fine.  Denies having any symptoms of depression.  Denies anxiety.  Continues to take Adderall regularly and feels that it helps well with his attention and focus.  Denies any sleep problems denies appetite problems denies anger problems.  Patient was alert and oriented appropriate in the interview.  Forthcoming in discussing how he uses the Valium only rarely as needed for muscle spasms and is cut down significantly on the amount of it that he uses.  No evidence of psychosis or disorganized thinking.  Alert and oriented x4.  Clearly understood situation and was able to make reasonable judgments.  Reviewed his medications specifically Adderall 30 mg twice a day Xanax 1 mg twice a day and the rare as needed Valium.  Will renew all prescriptions.  Follow-up 3 months.

## 2020-02-17 ENCOUNTER — Encounter: Payer: Self-pay | Admitting: Psychiatry

## 2020-02-17 ENCOUNTER — Other Ambulatory Visit: Payer: Self-pay

## 2020-02-17 ENCOUNTER — Ambulatory Visit (INDEPENDENT_AMBULATORY_CARE_PROVIDER_SITE_OTHER): Payer: Self-pay | Admitting: Psychiatry

## 2020-02-17 ENCOUNTER — Encounter: Payer: Self-pay | Admitting: Physician Assistant

## 2020-02-17 ENCOUNTER — Ambulatory Visit (INDEPENDENT_AMBULATORY_CARE_PROVIDER_SITE_OTHER): Payer: Self-pay | Admitting: Physician Assistant

## 2020-02-17 VITALS — BP 127/73 | HR 68 | Temp 90.5°F | Wt 147.6 lb

## 2020-02-17 DIAGNOSIS — F902 Attention-deficit hyperactivity disorder, combined type: Secondary | ICD-10-CM

## 2020-02-17 DIAGNOSIS — F411 Generalized anxiety disorder: Secondary | ICD-10-CM

## 2020-02-17 DIAGNOSIS — S52502S Unspecified fracture of the lower end of left radius, sequela: Secondary | ICD-10-CM

## 2020-02-17 MED ORDER — ALPRAZOLAM 1 MG PO TABS: 1.0000 mg | ORAL_TABLET | Freq: Two times a day (BID) | ORAL | 2 refills | Status: DC

## 2020-02-17 MED ORDER — AMPHETAMINE-DEXTROAMPHETAMINE 30 MG PO TABS: 30.0000 mg | ORAL_TABLET | Freq: Two times a day (BID) | ORAL | 0 refills | Status: DC

## 2020-02-17 NOTE — Patient Instructions (Signed)
Radial Fracture  A radial fracture is a break in the radius bone. The radius is a bone in the forearm, on the same side as the thumb. The forearm is the part of the arm that is between the elbow and the wrist. A radial fracture near the wrist (distal radialfracture) is the most common type of broken arm. A fracture can also occur near the elbow (radial head fracture). What are the causes? The most common cause of a radial fracture is falling with the arm outstretched. Other causes include:  An accident, such as a car or bike accident.  A hard, direct hit to the forearm. What increases the risk? You may be at greater risk for a radial fracture if you:  Are male.  Are an older adult.  Play contact sports.  Have a condition that causes your bones to become thin and brittle (osteoporosis). What are the signs or symptoms? A radial fracture causes pain immediately after the injury. Other signs and symptoms may include:  An abnormal bend or bump in the arm (deformity).  Swelling.  Bruising.  Numbness or tingling in your arm and hand.  Limited movement of your arm and hand. How is this diagnosed? This condition may be diagnosed based on:  Your symptoms and medical history.  A physical exam.  An X-ray. How is this treated? Treatment depends on how severe your fracture is, where it is, and how the pieces of the broken bone line up with each other (alignment).  The first step may be for you to wear a temporary splint for a few days, until your swelling goes down. After the swelling goes down, you may get a cast, get a different type of splint, or have surgery.  If your broken bone is in good alignment, you will need to wear a splint or cast for up to 6 weeks.  If your broken bone is not aligned (is displaced), your health care provider will need to align the bone pieces. After alignment, you will need to wear a splint or cast for up to 6 weeks. To align your broken bone, your  health care provider may: ? Move the bones back into position without surgery (closed reduction). ? Perform surgery to align the fracture and fix the bone pieces into place with metal screws, plates, or wires (open reduction and internal fixation, ORIF). ? Perform surgery to align the fracture and fix the bone pieces into place with pins that are attached to a stabilizing bar outside your skin (external fixation). Treatment may also include:  Having your cast changed after 2-3 weeks.  Physical therapy.  Follow-up visits and X-rays to make sure you are healing. Follow these instructions at home: If you have a splint:  Wear it as told by your health care provider. Remove it only as told by your health care provider.  Loosen the splint if your fingers tingle, become numb, or turn cold and blue.  Keep the splint clean and dry. If you have a cast:  Do not stick anything inside the cast to scratch your skin. Doing that increases your risk for infection.  Check the skin around the cast every day. Tell your health care provider about any concerns.  You may put lotion on dry skin around the edges of the cast. Do not put lotion on the skin underneath the cast.  Keep the cast clean and dry. Bathing  Do not take baths, swim, or use a hot tub until your health care  provider approves. Ask your health care provider if you may take showers. You may only be allowed to take sponge baths.  If your splint or cast is not waterproof: ? Do not let it get wet. ? Cover it with a watertight covering when you take a bath or a shower. Activity  Do not lift anything with your injured arm.  Do not use the injured arm to support your body weight until your health care provider says that you can.  Ask your health care provider what activities are safe for you during recovery, and ask what activities you need to avoid. Managing pain, stiffness, and swelling   If directed, put ice on painful areas: ? If  you have a removable splint, remove it as told by your health care provider. ? Put ice in a plastic bag. ? Place a towel between your skin and the bag, or between your cast and the bag. ? Leave the ice on for 20 minutes, 2-3 times a day.  Move your fingers often to avoid stiffness and to lessen swelling.  Raise (elevate) your arm above the level of your heart while you are sitting or lying down. General instructions  Do not put pressure on any part of the cast or splint until it is fully hardened, if applicable. This may take several hours.  Take over-the-counter and prescription medicines only as told by your health care provider.  Do not drive until your health care provider approves. You should not drive or use heavy machinery while taking prescription pain medicine.  Do not use any products that contain nicotine or tobacco, such as cigarettes and e-cigarettes. These can delay bone healing. If you need help quitting, ask your health care provider.  Keep all follow-up visits as told by your health care provider. This is important. Contact a health care provider if you have:  Pain that does not get better with medicine.  Swelling that gets worse.  A bad smell coming from your cast. Get help right away if:  You cannot move your fingers.  You have severe pain.  Your fingers or your hand: ? Become numb, cold, or pale. ? Turn a bluish color. Summary  A radial fracture is a break in the radius bone. The radius is in the forearm, on the same side as the thumb.  Treatment depends on how severe your fracture is, where it is, and how the pieces of the broken bone line up with each other.  A splint or cast may be needed to help the fracture heal. A more severe break may require surgery. This information is not intended to replace advice given to you by your health care provider. Make sure you discuss any questions you have with your health care provider. Document Revised: 12/03/2017  Document Reviewed: 12/03/2017 Elsevier Patient Education  2020 [employer].

## 2020-02-17 NOTE — Progress Notes (Signed)
Follow-up note for this 44 year old gentleman with ADHD.  Patient was reached by telephone.  He tells me that his most acute concern is that he appears to have ruptured a tendon or something in his left hand.  He felt a pop the other day and can see something that looks abnormal under his skin.  Having pain in the hand.  He is anxious about this because of course it could have a huge impact on his work life since he works with his hands.  Otherwise psychiatrically no complaints.  Continues to have good attention focus and concentration.  No mood swings no anxiety.  Alert and oriented appropriate in conversation.  Affect appropriate and reactive thoughts lucid.  No sign of medicine problems no side effects.  Renew medicines for ADHD supportive counseling and therapy.  Follow-up 3 months.
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
## 2020-03-10 ENCOUNTER — Telehealth: Payer: Self-pay
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
## 2020-03-11 NOTE — Telephone Encounter (Signed)
His orthopedist who is treating this needs to complete the [facility]

## 2020-03-13 NOTE — Telephone Encounter (Signed)
Joel Burgess called back to say he no longer needs the FMLA papers completed.

## 2020-03-13 NOTE — Telephone Encounter (Signed)
LMTCB, [facility] Triage Nurse may give patient results  

## 2020-04-04 ENCOUNTER — Telehealth: Payer: Self-pay

## 2020-04-04 NOTE — Telephone Encounter (Signed)
Copied from [place] [id number]. Topic: General - Other >> Apr 04, 2020  2:20 PM Celene Kras wrote: Reason for CRM: Pt called stating that he received both doses of his pfizer vaccine. He is requesting to have his record updated. Please advise.

## 2020-04-19 ENCOUNTER — Other Ambulatory Visit: Payer: Self-pay | Admitting: Psychiatry

## 2020-04-24 ENCOUNTER — Other Ambulatory Visit: Payer: Self-pay | Admitting: Psychiatry

## 2020-04-24 MED ORDER — ALPRAZOLAM 1 MG PO TABS: 1.0000 mg | ORAL_TABLET | Freq: Two times a day (BID) | ORAL | 2 refills | Status: DC

## 2020-04-24 MED ORDER — AMPHETAMINE-DEXTROAMPHETAMINE 30 MG PO TABS: 30.0000 mg | ORAL_TABLET | Freq: Two times a day (BID) | ORAL | 0 refills | Status: DC

## 2020-04-25 ENCOUNTER — Telehealth (INDEPENDENT_AMBULATORY_CARE_PROVIDER_SITE_OTHER): Payer: Self-pay | Admitting: Psychiatry

## 2020-04-25 ENCOUNTER — Encounter: Payer: Self-pay | Admitting: Psychiatry

## 2020-04-25 ENCOUNTER — Other Ambulatory Visit: Payer: Self-pay

## 2020-04-25 DIAGNOSIS — F902 Attention-deficit hyperactivity disorder, combined type: Secondary | ICD-10-CM

## 2020-04-25 DIAGNOSIS — F411 Generalized anxiety disorder: Secondary | ICD-10-CM

## 2020-04-25 MED ORDER — DIAZEPAM 10 MG PO TABS: 10.0000 mg | ORAL_TABLET | Freq: Every evening | ORAL | 2 refills | Status: DC | PRN
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
## 2020-07-19 ENCOUNTER — Telehealth: Payer: Self-pay | Admitting: Family Medicine

## 2020-07-19 MED ORDER — ALBUTEROL SULFATE HFA 108 (90 BASE) MCG/ACT IN AERS: INHALATION_SPRAY | RESPIRATORY_TRACT | 3 refills | Status: DC

## 2020-07-19 NOTE — Telephone Encounter (Signed)
[facility] the [phone] Phone [phone] - [facility]ax   albuterol (VENTOLIN HFA)  MCG/ACT inhaler   Please advise.  Thanks, [employer]

## 2020-07-21 ENCOUNTER — Other Ambulatory Visit: Payer: Self-pay | Admitting: Psychiatry

## 2020-07-25 ENCOUNTER — Other Ambulatory Visit: Payer: Self-pay | Admitting: Psychiatry

## 2020-07-25 MED ORDER — AMPHETAMINE-DEXTROAMPHETAMINE 30 MG PO TABS: 30.0000 mg | ORAL_TABLET | Freq: Two times a day (BID) | ORAL | 0 refills | Status: DC

## 2020-07-26 ENCOUNTER — Telehealth: Payer: Self-pay

## 2020-07-26 ENCOUNTER — Other Ambulatory Visit: Payer: Self-pay | Admitting: Psychiatry

## 2020-07-26 MED ORDER — ALPRAZOLAM 1 MG PO TABS: 1.0000 mg | ORAL_TABLET | Freq: Two times a day (BID) | ORAL | 2 refills | Status: DC

## 2020-07-26 NOTE — Telephone Encounter (Signed)
pt called states he needed a refill on his xanax and he also needed to speak with you about something.

## 2020-07-27 ENCOUNTER — Other Ambulatory Visit: Payer: Self-pay

## 2020-07-27 ENCOUNTER — Telehealth (INDEPENDENT_AMBULATORY_CARE_PROVIDER_SITE_OTHER): Payer: Self-pay | Admitting: Psychiatry

## 2020-07-27 ENCOUNTER — Encounter: Payer: Self-pay | Admitting: Psychiatry

## 2020-07-27 DIAGNOSIS — F902 Attention-deficit hyperactivity disorder, combined type: Secondary | ICD-10-CM

## 2020-07-27 DIAGNOSIS — F411 Generalized anxiety disorder: Secondary | ICD-10-CM

## 2020-07-27 NOTE — Progress Notes (Signed)
Follow-up for this patient with ADHD and recent mood and anxiety symptoms.  He continues to have pain in his injured hand which appears to be showing little recovery.  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  Patient feels discouraged and upset about this but has retained an attorney and is seeking disability.  Meanwhile seems [place] and a little bored.  Tolerating medicine well.  Denies abuse.  Denies any psychotic symptoms or suicidal thoughts.  Identified myself and patient.  Total time on the telephone about 15 minutes.  Patient is discouraged and down but reactive.  Lucid without any sign of psychosis.  Denies suicidal thoughts.  Encouragement to patient.  Refilled his usual medication including his benzodiazepines and his Adderall.  No indication to add anything at this point.  We will check up in another 2 to 3 months as needed.

## 2020-09-26 ENCOUNTER — Encounter: Payer: Self-pay | Admitting: Psychiatry

## 2020-09-26 ENCOUNTER — Other Ambulatory Visit: Payer: Self-pay

## 2020-09-26 ENCOUNTER — Telehealth (INDEPENDENT_AMBULATORY_CARE_PROVIDER_SITE_OTHER): Payer: Self-pay | Admitting: Psychiatry

## 2020-09-26 DIAGNOSIS — F411 Generalized anxiety disorder: Secondary | ICD-10-CM

## 2020-09-26 DIAGNOSIS — F902 Attention-deficit hyperactivity disorder, combined type: Secondary | ICD-10-CM

## 2020-09-26 MED ORDER — AMPHETAMINE-DEXTROAMPHETAMINE 30 MG PO TABS: 30.0000 mg | ORAL_TABLET | Freq: Two times a day (BID) | ORAL | 0 refills | Status: DC

## 2020-09-26 MED ORDER — ALPRAZOLAM 1 MG PO TABS: 1.0000 mg | ORAL_TABLET | Freq: Two times a day (BID) | ORAL | 2 refills | Status: DC

## 2020-09-26 MED ORDER — DIAZEPAM 10 MG PO TABS: 10.0000 mg | ORAL_TABLET | Freq: Every evening | ORAL | 2 refills | Status: DC | PRN
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
## 2020-12-07 ENCOUNTER — Other Ambulatory Visit: Payer: Self-pay

## 2020-12-07 ENCOUNTER — Telehealth (INDEPENDENT_AMBULATORY_CARE_PROVIDER_SITE_OTHER): Payer: Self-pay | Admitting: Psychiatry

## 2020-12-07 ENCOUNTER — Encounter: Payer: Self-pay | Admitting: Psychiatry

## 2020-12-07 DIAGNOSIS — F902 Attention-deficit hyperactivity disorder, combined type: Secondary | ICD-10-CM

## 2020-12-07 DIAGNOSIS — F411 Generalized anxiety disorder: Secondary | ICD-10-CM

## 2020-12-07 MED ORDER — AMPHETAMINE-DEXTROAMPHETAMINE 30 MG PO TABS: 30.0000 mg | ORAL_TABLET | Freq: Two times a day (BID) | ORAL | 0 refills | Status: DC

## 2020-12-07 MED ORDER — DIAZEPAM 10 MG PO TABS
10.0000 mg | ORAL_TABLET | Freq: Every evening | ORAL | 2 refills | Status: DC | PRN
Start: 2020-12-07 — End: 2021-05-03

## 2020-12-07 MED ORDER — ALPRAZOLAM 1 MG PO TABS: 1.0000 mg | ORAL_TABLET | Freq: Two times a day (BID) | ORAL | 2 refills | Status: DC

## 2020-12-07 NOTE — Progress Notes (Signed)
Virtual Visit via Telephone Note  I connected with Joel Burgess on 12/07/20 at  1:00 PM EST by telephone and verified that I am speaking with the correct person using two identifiers.  Location: Patient: Home Provider: Hospital   I discussed the limitations, risks, security and privacy concerns of performing an evaluation and management service by telephone and the availability of in person appointments. I also discussed with the patient that there may be a patient responsible charge related to this service. The patient expressed understanding and agreed to proceed.   History of Present Illness: Reach patient by telephone.  Patient has no specific new complaints.  He recently got a small dog and finds that it is very helpful in controlling his anxiety.  He requests a letter allowing it to be considered a service or emotional support dog.  Mood generally fine.  No return of depression.  Focus and concentration good.  Sleeping adequately.    Observations/Objective: Calm and lucid.  No sign of mania.  Denies any suicidal thought.  Denies any psychotic symptoms.   Assessment and Plan: Reviewed medication risks and benefits and usage.  Refilled medication in a way that should keep them on target with the correct dates   Follow Up Instructions: Follow-up 3 months or so    I discussed the assessment and treatment plan with the patient. The patient was provided an opportunity to ask questions and all were answered. The patient agreed with the plan and demonstrated an understanding of the instructions.   The patient was advised to call back or seek an in-person evaluation if the symptoms worsen or if the condition fails to improve as anticipated.  I provided 20 minutes of non-face-to-face time during this encounter.   Alethia Berthold, MD

## 2021-01-22 ENCOUNTER — Other Ambulatory Visit: Payer: Self-pay | Admitting: Psychiatry

## 2021-01-25 ENCOUNTER — Encounter: Payer: Self-pay | Admitting: Psychiatry

## 2021-01-25 ENCOUNTER — Telehealth (INDEPENDENT_AMBULATORY_CARE_PROVIDER_SITE_OTHER): Payer: Self-pay | Admitting: Psychiatry

## 2021-01-25 ENCOUNTER — Other Ambulatory Visit: Payer: Self-pay

## 2021-01-25 DIAGNOSIS — F411 Generalized anxiety disorder: Secondary | ICD-10-CM

## 2021-01-25 DIAGNOSIS — F902 Attention-deficit hyperactivity disorder, combined type: Secondary | ICD-10-CM

## 2021-01-25 MED ORDER — AMPHETAMINE-DEXTROAMPHETAMINE 30 MG PO TABS: 30.0000 mg | ORAL_TABLET | Freq: Two times a day (BID) | ORAL | 0 refills | Status: DC

## 2021-01-25 NOTE — Progress Notes (Signed)
Virtual Visit via Telephone Note  I connected with Joel Burgess on 01/25/21 at  1:20 PM EST by telephone and verified that I am speaking with the correct person using two identifiers.  Location: Patient: Home Provider: Hospital   I discussed the limitations, risks, security and privacy concerns of performing an evaluation and management service by telephone and the availability of in person appointments. I also discussed with the patient that there may be a patient responsible charge related to this service. The patient expressed understanding and agreed to proceed.   History of Present Illness: Patient reached by telephone.  On time and expecting appointment.  Is feeling upbeat.  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  Patient seems to feel this is an outstanding idea and a fail safe investment.  Overall thoughts appear to be lucid.  No forward of any psychotic symptoms no agitation no suicidal or anger problems medical issues about the same    Observations/Objective: Alert and oriented.  Affect appropriate and reactive.  Thoughts clear and lucid without any loosening of associations or delusional thoughts.  No suicidal thinking.  No sign of any dangerousness   Assessment and Plan: [facility] not able to fill his full prescription of Adderall this month and he would [facility].  I have gone ahead and accommodated that.  Otherwise we can check up in 2 months as usually scheduled   Follow Up Instructions: Follow-up 2 months    I discussed the assessment and treatment plan with the patient. The patient was provided an opportunity to ask questions and all were answered. The patient agreed with the plan and demonstrated an understanding of the instructions.   The  patient was advised to call back or seek an in-person evaluation if the symptoms worsen or if the condition fails to improve as anticipated.  I provided 20 minutes of non-face-to-face time during this encounter.   Alethia Berthold, MD

## 2021-02-21 ENCOUNTER — Other Ambulatory Visit: Payer: Self-pay | Admitting: Psychiatry

## 2021-02-21 MED ORDER — AMPHETAMINE-DEXTROAMPHETAMINE 30 MG PO TABS: 30.0000 mg | ORAL_TABLET | Freq: Two times a day (BID) | ORAL | 0 refills | Status: DC

## 2021-04-24 ENCOUNTER — Telehealth (INDEPENDENT_AMBULATORY_CARE_PROVIDER_SITE_OTHER): Payer: Self-pay | Admitting: Psychiatry

## 2021-04-24 ENCOUNTER — Other Ambulatory Visit: Payer: Self-pay

## 2021-04-24 DIAGNOSIS — F902 Attention-deficit hyperactivity disorder, combined type: Secondary | ICD-10-CM

## 2021-04-24 DIAGNOSIS — F411 Generalized anxiety disorder: Secondary | ICD-10-CM

## 2021-04-24 MED ORDER — ALPRAZOLAM 1 MG PO TABS: 1.0000 mg | ORAL_TABLET | Freq: Two times a day (BID) | ORAL | 2 refills | Status: DC

## 2021-04-24 MED ORDER — AMPHETAMINE-DEXTROAMPHETAMINE 30 MG PO TABS: 30.0000 mg | ORAL_TABLET | Freq: Two times a day (BID) | ORAL | 0 refills | Status: DC

## 2021-04-24 NOTE — Progress Notes (Signed)
Virtual Visit via Telephone Note  I connected with Joel Burgess on 04/24/21 at  2:00 PM EDT by telephone and verified that I am speaking with the correct person using two identifiers.  Location: Patient: Home Provider: Hospital   I discussed the limitations, risks, security and privacy concerns of performing an evaluation and management service by telephone and the availability of in person appointments. I also discussed with the patient that there may be a patient responsible charge related to this service. The patient expressed understanding and agreed to proceed.   History of Present Illness: Follow-up for this patient with ADHD and anxiety.  Patient reached by telephone.  Established identity of both.  He is sounding anxious.  He tells me that somebody is trying to take his disability away and insisting that he go back to school.  He is upset about this and talks for a while about how he feels it would be impossible for him to go to school or do any work right now.  He does say that his mood however is okay he is not having suicidal thoughts no psychosis.  Continues to take his medicine regularly.  Denies any side effects.   Observations/Objective: Alert and oriented.  Reactive if anxious affect.  Thoughts lucid.  No loosening of associations or delusions noted.  No sign of dangerousness   Assessment and Plan: Expressed sympathy and support for him.  Reviewed medication plan including his Adderall and alprazolam.  Prescriptions refilled.  Follow-up 3 months   Follow Up Instructions:    I discussed the assessment and treatment plan with the patient. The patient was provided an opportunity to ask questions and all were answered. The patient agreed with the plan and demonstrated an understanding of the instructions.   The patient was advised to call back or seek an in-person evaluation if the symptoms worsen or if the condition fails to improve as anticipated.  I provided 20 minutes of  non-face-to-face time during this encounter.   Alethia Berthold, MD

## 2021-04-29 ENCOUNTER — Other Ambulatory Visit: Payer: Self-pay | Admitting: Psychiatry

## 2021-05-01 ENCOUNTER — Telehealth: Payer: Self-pay

## 2021-05-01 NOTE — Telephone Encounter (Signed)
Medication refill request - Telephone call with patient as he requested Dr. Weber Cooks send in a new Diazepam  order for him.  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  Agreed to send request to Dr. Weber Cooks and [facility] [employer] [facility] [address].

## 2021-05-03 ENCOUNTER — Telehealth (HOSPITAL_BASED_OUTPATIENT_CLINIC_OR_DEPARTMENT_OTHER): Payer: Self-pay | Admitting: Psychiatry

## 2021-05-03 ENCOUNTER — Telehealth: Payer: Self-pay | Admitting: Family Medicine

## 2021-05-03 ENCOUNTER — Other Ambulatory Visit: Payer: Self-pay

## 2021-05-03 DIAGNOSIS — F411 Generalized anxiety disorder: Secondary | ICD-10-CM

## 2021-05-03 DIAGNOSIS — F902 Attention-deficit hyperactivity disorder, combined type: Secondary | ICD-10-CM

## 2021-05-03 MED ORDER — DIAZEPAM 10 MG PO TABS: 10.0000 mg | ORAL_TABLET | Freq: Every evening | ORAL | 2 refills | Status: DC | PRN

## 2021-05-03 NOTE — Telephone Encounter (Signed)
Pt called and stated that Dr. Weber Cooks has refilled his meds for 90 days and wanted Dr. Caryn Section to take over the refills for diazepam (VALIUM) 10 MG tablet  When pt is out and Dr. Weber Cooks will continue to refill the others/ please advise if pt needs appt

## 2021-05-03 NOTE — Telephone Encounter (Signed)
Please advise on whether you are willing to manage this medication.

## 2021-05-03 NOTE — Progress Notes (Signed)
Virtual Visit via Telephone Note  I connected with Joel Burgess on 05/03/21 at  1:00 PM EDT by telephone and verified that I am speaking with the correct person using two identifiers.  Location: Patient: Home Provider: Hospital   I discussed the limitations, risks, security and privacy concerns of performing an evaluation and management service by telephone and the availability of in person appointments. I also discussed with the patient that there may be a patient responsible charge related to this service. The patient expressed understanding and agreed to proceed.   History of Present Illness: Patient reached by telephone.  Appropriate and on time for appointment.  Affect euthymic.  Thoughts clear.  No evidence of delusional thinking.  Patient wanted to request a refill of his diazepam.  We discussed his use of that medicine and it was made clear to me that it was mainly for muscle spasms and pain from his neck.  Otherwise no mental health complaints.  Mood is stable    Observations/Objective: Alert and oriented.  Not oversedated.  Calm and pleasant in conversation.  Denies any suicidal thoughts or psychosis   Assessment and Plan: Discussed with him how it is really more appropriate that this medicine be prescribed by his primary care doctor if it is primarily for orthopedic pain.  I agreed to refill the prescription for now but in the next couple months he needs to get his regular primary care doctor to prescribe it again   Follow Up Instructions: Follow-up with me in another 3 months    I discussed the assessment and treatment plan with the patient. The patient was provided an opportunity to ask questions and all were answered. The patient agreed with the plan and demonstrated an understanding of the instructions.   The patient was advised to call back or seek an in-person evaluation if the symptoms worsen or if the condition fails to improve as anticipated.  I provided 15 minutes of  non-face-to-face time during this encounter.   Alethia Berthold, MD

## 2021-05-07 NOTE — Telephone Encounter (Signed)
That's fine

## 2021-05-08 NOTE — Telephone Encounter (Signed)
Patient advised.

## 2021-05-18 ENCOUNTER — Telehealth: Payer: Self-pay

## 2021-05-18 NOTE — Telephone Encounter (Signed)
Alternate apply ice pack and heat pad for 10 minutes every 2-3 hours.

## 2021-05-18 NOTE — Telephone Encounter (Signed)
Copied from [place] [phone]. Topic: Appointment Scheduling - Scheduling Inquiry for Clinic >> May 18, 2021  9:46 AM Joel Burgess D wrote: Reason for CRM: Pt's wife called saying her husband has pulled a muscle in his pectoral area from lifting his dog.  It has been off and on for several days but today it is hurting worse.  He would like to schedule an appt if possible to see some one today.  CB# [phone]

## 2021-05-18 NOTE — Telephone Encounter (Signed)
Nothing available today. Any recommendations? Please advise. Thanks!

## 2021-05-18 NOTE — Telephone Encounter (Signed)
Patient advised and agrees to follow Dr. Maralyn Sago recommendation. I advised patient to go to urgent care or ER if pain worsens. Patient verbalized understanding.

## 2021-05-23 ENCOUNTER — Telehealth: Payer: Self-pay

## 2021-05-23 ENCOUNTER — Ambulatory Visit: Payer: Self-pay | Admitting: *Deleted

## 2021-05-23 NOTE — Telephone Encounter (Signed)
Per agent: "  Patient called in about having an left ear infection, and wanted to speak with a nurse about any home care advice, scheduled pt 06/17 CRM to office if they could work pt in sooner. "       Three attempts to reach pt leaving messages to [place].   Unable to reach pt.

## 2021-05-23 NOTE — Telephone Encounter (Signed)
There are no available appointments in the office. Patient is going to try doing a tele health visit with [place].

## 2021-05-23 NOTE — Telephone Encounter (Signed)
Copied from [place] [phone]. Topic: Appointment Scheduling - Scheduling Inquiry for Clinic >> May 23, 2021  3:46 PM Valere Dross wrote: Reason for CRM: Patient called in about getting an appt for an ear infection, pt was trying to get a medication he had used before. There were no sooner appts available that the pt wanted and requested to be reached out when something opened up. Please advise

## 2021-05-24 NOTE — Telephone Encounter (Signed)
Patient reports he has used OTC medication and pain is much better. No need for appointment at this time.

## 2021-05-25 NOTE — Telephone Encounter (Signed)
FYI

## 2021-06-08 ENCOUNTER — Ambulatory Visit: Payer: Self-pay | Admitting: Family Medicine

## 2021-07-24 ENCOUNTER — Other Ambulatory Visit: Payer: Self-pay

## 2021-07-24 ENCOUNTER — Telehealth (HOSPITAL_BASED_OUTPATIENT_CLINIC_OR_DEPARTMENT_OTHER): Payer: Self-pay | Admitting: Psychiatry

## 2021-07-24 DIAGNOSIS — F411 Generalized anxiety disorder: Secondary | ICD-10-CM

## 2021-07-24 DIAGNOSIS — F902 Attention-deficit hyperactivity disorder, combined type: Secondary | ICD-10-CM

## 2021-07-24 MED ORDER — AMPHETAMINE-DEXTROAMPHETAMINE 30 MG PO TABS: 30.0000 mg | ORAL_TABLET | Freq: Two times a day (BID) | ORAL | 0 refills | Status: DC

## 2021-07-24 MED ORDER — ALPRAZOLAM 1 MG PO TABS: 1.0000 mg | ORAL_TABLET | Freq: Two times a day (BID) | ORAL | 2 refills | Status: DC

## 2021-07-24 NOTE — Progress Notes (Signed)
Virtual Visit via Telephone Note  I connected with Joel Burgess on 07/24/21 at  1:30 PM EDT by telephone and verified that I am speaking with the correct person using two identifiers.  Location: Patient: Home Provider: Hospital   I discussed the limitations, risks, security and privacy concerns of performing an evaluation and management service by telephone and the availability of in person appointments. I also discussed with the patient that there may be a patient responsible charge related to this service. The patient expressed understanding and agreed to proceed.   History of Present Illness: Patient reached by telephone.  Established identities.  Patient reports he has no new complaint.  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  Family life are going well.  Sleeping adequately.  Pain under adequate control.  Dr. Caryn Section agreed to take over prescribing Valium.  Patient denies any abuse of medication.  Says he is still getting good concentration and focus.    Observations/Objective: Alert and oriented.  Affect euthymic.  Thoughts lucid no evidence of loosening of associations or delusions.  Denies suicidal or homicidal ideation   Assessment and Plan: Doing well overall.  Appears to be tolerating and using medicine appropriately.  Reviewed medication.  Prescriptions renewed.  Follow up in 3 months   Follow Up Instructions:    I discussed the assessment and treatment plan with the patient. The patient was provided an opportunity to ask questions and all were answered. The patient agreed with the plan and demonstrated an understanding of the instructions.   The patient was advised to call back or seek an in-person evaluation if the symptoms worsen or if the condition fails to improve as anticipated.  I provided 20 minutes of non-face-to-face time during this encounter.   Alethia Berthold, MD

## 2021-09-22 ENCOUNTER — Other Ambulatory Visit: Payer: Self-pay | Admitting: Family Medicine

## 2021-10-02 ENCOUNTER — Telehealth (INDEPENDENT_AMBULATORY_CARE_PROVIDER_SITE_OTHER): Payer: Self-pay | Admitting: Psychiatry

## 2021-10-02 ENCOUNTER — Other Ambulatory Visit: Payer: Self-pay

## 2021-10-02 DIAGNOSIS — F411 Generalized anxiety disorder: Secondary | ICD-10-CM

## 2021-10-02 DIAGNOSIS — F902 Attention-deficit hyperactivity disorder, combined type: Secondary | ICD-10-CM

## 2021-10-02 NOTE — Progress Notes (Signed)
Virtual Visit via Telephone Note  I connected with Joel Burgess on 10/02/21 at  1:30 PM EDT by telephone and verified that I am speaking with the correct person using two identifiers.  Location: Patient: Home Provider: Hospital   I discussed the limitations, risks, security and privacy concerns of performing an evaluation and management service by telephone and the availability of in person appointments. I also discussed with the patient that there may be a patient responsible charge related to this service. The patient expressed understanding and agreed to proceed.   History of Present Illness: Reach patient by telephone.  Established identities.  Patient had made the appointment early because he had had a change in how he takes his benzodiazepines.  The Valium is more helpful for his muscle problem so he is taking that twice a day but has cut alprazolam down.  Generally says he is feeling well.  Denies substance abuse.  Denies suicidal thought.  Concentration and focus okay.    Observations/Objective: Alert and oriented.  Lucid.  Appropriate interaction.  No suicidal thought no disorganized thinking   Assessment and Plan: Appears stable.  We discussed the importance of not continually going up on benzodiazepines but we can try making the switch of 1 for the other.  Follow-up 3 months   Follow Up Instructions:    I discussed the assessment and treatment plan with the patient. The patient was provided an opportunity to ask questions and all were answered. The patient agreed with the plan and demonstrated an understanding of the instructions.   The patient was advised to call back or seek an in-person evaluation if the symptoms worsen or if the condition fails to improve as anticipated.  I provided 20 minutes of non-face-to-face time during this encounter.   Alethia Berthold, MD

## 2021-10-09 ENCOUNTER — Ambulatory Visit: Payer: Self-pay | Admitting: Family Medicine

## 2021-10-09 NOTE — Progress Notes (Deleted)
      Established patient visit   Patient: Joel Burgess   DOB: 1976/09/28   45 y.o. Male  MRN: [id number] Visit Date: 10/09/2021  Today's healthcare provider: Lelon Huh, MD   No chief complaint on file.  Subjective    HPI  ***  {Link to patient history deactivated due to formatting error:1}  Medications: Outpatient Medications Prior to Visit  Medication Sig   albuterol (VENTOLIN HFA) 108 (90 Base) MCG/ACT inhaler INHALE 2 PUFFS EVERY 6 HOURS AS NEEDED FOR WHEEZING   ALPRAZolam (XANAX) 1 MG tablet Take 1 tablet (1 mg total) by mouth 2 (two) times daily.   amphetamine-dextroamphetamine (ADDERALL) 30 MG tablet Take 1 tablet by mouth 2 (two) times daily with a meal.   amphetamine-dextroamphetamine (ADDERALL) 30 MG tablet Take 1 tablet by mouth 2 (two) times daily with a meal.   amphetamine-dextroamphetamine (ADDERALL) 30 MG tablet Take 1 tablet by mouth 2 (two) times daily with a meal.   azithromycin (ZITHROMAX) 250 MG tablet Two tablets on day 1, one tablet each of the following four days.   diazepam (VALIUM) 10 MG tablet TAKE 1 TABLET (10 MG TOTAL) BY MOUTH AT BEDTIME AS NEEDED. FOR SLEEP   diazepam (VALIUM) 10 MG tablet Take 1 tablet (10 mg total) by mouth at bedtime as needed. For muscle spasm   No facility-administered medications prior to visit.    Review of Systems  {Labs  Heme  Chem  Endocrine  Serology  Results Review (optional):23779}   Objective    There were no vitals taken for this visit. {Show previous vital signs (optional):23777}  Physical Exam  ***  No results found for any visits on 10/09/21.  Assessment & Plan     ***  No follow-ups on file.      {provider attestation***:1}   Lelon Huh, MD  [facility] [phone] (phone) [phone] (fax)  [place]

## 2021-10-23 ENCOUNTER — Telehealth: Payer: Self-pay | Admitting: *Deleted

## 2021-10-23 NOTE — Telephone Encounter (Signed)
Pt came in the office to set an appt and requesting refill amphetamine-dextroamphetamine (ADDERALL) 30 MG tablet   Last refill--07/24/21 Last VV--10/02/21 Next appt--10/25/21

## 2021-10-23 NOTE — Progress Notes (Signed)
      Established patient visit   Patient: Joel Burgess   DOB: Aug 31, 1976   45 y.o. Male  MRN: [id number] Visit Date: 10/24/2021  Today's healthcare provider: Lelon Huh, MD   Chief Complaint  Patient presents with   Foot Pain   Shortness of Breath   Subjective    HPI  Foot pain: Patient complains of pain on the bottom of his left foot. Pain started 2 year ago. Pain worseness when walking.  Shortness of breath: Patient reports that he gets short of breath during the winter months due to cold temperatures. Patient uses an albuterol inhaler to help alleviate symptoms.      Medications: Outpatient Medications Prior to Visit  Medication Sig   albuterol (VENTOLIN HFA) 108 (90 Base) MCG/ACT inhaler INHALE 2 PUFFS EVERY 6 HOURS AS NEEDED FOR WHEEZING   ALPRAZolam (XANAX) 1 MG tablet Take 1 tablet (1 mg total) by mouth 2 (two) times daily.   amphetamine-dextroamphetamine (ADDERALL) 30 MG tablet Take 1 tablet by mouth 2 (two) times daily with a meal.   amphetamine-dextroamphetamine (ADDERALL) 30 MG tablet Take 1 tablet by mouth 2 (two) times daily with a meal.   amphetamine-dextroamphetamine (ADDERALL) 30 MG tablet Take 1 tablet by mouth 2 (two) times daily with a meal.   azithromycin (ZITHROMAX) 250 MG tablet Two tablets on day 1, one tablet each of the following four days.   diazepam (VALIUM) 10 MG tablet TAKE 1 TABLET (10 MG TOTAL) BY MOUTH AT BEDTIME AS NEEDED. FOR SLEEP   diazepam (VALIUM) 10 MG tablet Take 1 tablet (10 mg total) by mouth at bedtime as needed. For muscle spasm   No facility-administered medications prior to visit.    Review of Systems  Constitutional:  Negative for appetite change, chills and fever.  Respiratory:  Positive for shortness of breath (in cold temperatures). Negative for chest tightness and wheezing.   Cardiovascular:  Negative for chest pain and palpitations.  Gastrointestinal:  Negative for abdominal pain, nausea and vomiting.   Musculoskeletal:        Left foot pain      Objective    BP 126/72 (BP Location: Left Arm, Patient Position: Sitting, Cuff Size: Normal)   Pulse (!) 59   Temp 97.9 F (36.6 C) (Oral)   Resp 16   Ht 5\' 7"  (1.702 m)   Wt 149 lb (67.6 kg)   SpO2 100%   BMI 23.34 kg/m  {Show previous vital signs (optional):23777}  Physical Exam   About 36mm plantar wart left mid good.     Assessment & Plan     1. Mild intermittent reactive airway disease without complication Triggered by cold weather and currently out of rescue inhaler.  refill albuterol (VENTOLIN HFA) 108 (90 Base) MCG/ACT inhaler; INHALE 2 PUFFS EVERY 6 HOURS AS NEEDED FOR WHEEZING  Dispense: 8.5 each; Refill: 3  2. Plantar wart Try OTC medications, call for podiatry referral if not resolved in a few months.   Flu vaccine was recommended but refused by patient          The entirety of the information documented in the History of Present Illness, Review of Systems and Physical Exam were personally obtained by me. Portions of this information were initially documented by the CMA and reviewed by me for thoroughness and accuracy.     Lelon Huh, MD  [facility] [phone] (phone) [phone] (fax)  [place]

## 2021-10-24 ENCOUNTER — Encounter: Payer: Self-pay | Admitting: Family Medicine

## 2021-10-24 ENCOUNTER — Ambulatory Visit (INDEPENDENT_AMBULATORY_CARE_PROVIDER_SITE_OTHER): Payer: Self-pay | Admitting: Family Medicine

## 2021-10-24 ENCOUNTER — Other Ambulatory Visit: Payer: Self-pay

## 2021-10-24 VITALS — BP 126/72 | HR 59 | Temp 97.9°F | Resp 16 | Ht 67.0 in | Wt 149.0 lb

## 2021-10-24 DIAGNOSIS — J4520 Mild intermittent asthma, uncomplicated: Secondary | ICD-10-CM

## 2021-10-24 DIAGNOSIS — B070 Plantar wart: Secondary | ICD-10-CM

## 2021-10-24 MED ORDER — ALBUTEROL SULFATE HFA 108 (90 BASE) MCG/ACT IN AERS: INHALATION_SPRAY | RESPIRATORY_TRACT | 3 refills | Status: AC

## 2021-10-24 NOTE — Patient Instructions (Addendum)
Please review the attached list of medications and notify my office if there are any errors.   Try over the counter Dr. Felicie Burgess Clear Away plantar wart remover. If not gone within 3 months, then call for a referral to podiatry

## 2021-10-25 ENCOUNTER — Telehealth (INDEPENDENT_AMBULATORY_CARE_PROVIDER_SITE_OTHER): Payer: Self-pay | Admitting: Psychiatry

## 2021-10-25 DIAGNOSIS — F902 Attention-deficit hyperactivity disorder, combined type: Secondary | ICD-10-CM

## 2021-10-25 DIAGNOSIS — F411 Generalized anxiety disorder: Secondary | ICD-10-CM

## 2021-10-25 MED ORDER — AMPHETAMINE-DEXTROAMPHETAMINE 30 MG PO TABS: 30.0000 mg | ORAL_TABLET | Freq: Two times a day (BID) | ORAL | 0 refills | Status: DC

## 2021-10-25 MED ORDER — DIAZEPAM 10 MG PO TABS: 10.0000 mg | ORAL_TABLET | Freq: Two times a day (BID) | ORAL | 2 refills | Status: DC

## 2021-10-25 MED ORDER — ALPRAZOLAM 1 MG PO TABS: 1.0000 mg | ORAL_TABLET | Freq: Every day | ORAL | 2 refills | Status: DC | PRN

## 2021-10-25 NOTE — Progress Notes (Signed)
Virtual Visit via Telephone Note  I connected with Joel Burgess on 10/25/21 at  2:20 PM EDT by telephone and verified that I am speaking with the correct person using two identifiers.  Location: Patient: Home Provider: Hospital   I discussed the limitations, risks, security and privacy concerns of performing an evaluation and management service by telephone and the availability of in person appointments. I also discussed with the patient that there may be a patient responsible charge related to this service. The patient expressed understanding and agreed to proceed.   History of Present Illness: Patient reached by telephone.  Identities established.  Patient reports that he needs refills of his Adderall.  He is correct about this.  Also it appears that I did not correctly make the adjustments to his Valium last time.  Continues to have a great deal of pain and would like to try increasing Valium while cutting back on Xanax.  No suicidal thought no psychosis.  Continues to get improvement in ability to focus with the Adderall.  No side effects reported.    Observations/Objective: Sounds a little bit withdrawn and dysphoric.  No suicidal thoughts no psychosis.  Good insight and judgment understands the situation does not sound cognitively impaired   Assessment and Plan: We will refill all of his Adderall prescriptions for another 3 months and also increase the Valium to twice a day while cutting Xanax down to once a day as he feels it helps more with his muscle pain.  Follow-up 3 months   Follow Up Instructions:    I discussed the assessment and treatment plan with the patient. The patient was provided an opportunity to ask questions and all were answered. The patient agreed with the plan and demonstrated an understanding of the instructions.   The patient was advised to call back or seek an in-person evaluation if the symptoms worsen or if the condition fails to improve as anticipated.  I  provided 10 minutes of non-face-to-face time during this encounter.   Alethia Berthold, MD

## 2021-10-30 ENCOUNTER — Ambulatory Visit: Payer: Self-pay | Admitting: Family Medicine

## 2022-01-03 ENCOUNTER — Other Ambulatory Visit: Payer: Self-pay

## 2022-01-03 ENCOUNTER — Telehealth (INDEPENDENT_AMBULATORY_CARE_PROVIDER_SITE_OTHER): Payer: Self-pay | Admitting: Psychiatry

## 2022-01-03 DIAGNOSIS — F902 Attention-deficit hyperactivity disorder, combined type: Secondary | ICD-10-CM

## 2022-01-03 DIAGNOSIS — F411 Generalized anxiety disorder: Secondary | ICD-10-CM

## 2022-01-03 MED ORDER — ALPRAZOLAM 1 MG PO TABS: 1.0000 mg | ORAL_TABLET | Freq: Every day | ORAL | 2 refills | Status: DC | PRN

## 2022-01-03 MED ORDER — AMPHETAMINE-DEXTROAMPHETAMINE 30 MG PO TABS: 30.0000 mg | ORAL_TABLET | Freq: Two times a day (BID) | ORAL | 0 refills | Status: DC

## 2022-01-03 MED ORDER — DIAZEPAM 10 MG PO TABS: 10.0000 mg | ORAL_TABLET | Freq: Two times a day (BID) | ORAL | 2 refills | Status: DC

## 2022-01-03 NOTE — Progress Notes (Signed)
Virtual Visit via Telephone Note  I connected with Burna Sis on 01/03/22 at  2:20 PM EST by telephone and verified that I am speaking with the correct person using two identifiers.  Location: Patient: Home Provider: Hospital   I discussed the limitations, risks, security and privacy concerns of performing an evaluation and management service by telephone and the availability of in person appointments. I also discussed with the patient that there may be a patient responsible charge related to this service. The patient expressed understanding and agreed to proceed.   History of Present Illness: Patient reached by telephone.  Identities established.  Patient has no complaints.  Pain is under better control with Valium.  Continues to have good response to his Adderall.  No complaints of any side effects.  Sleeping adequately.  Eating well.  No suicidal thought no anger and no disorganized thinking    Observations/Objective: Alert and oriented and appropriate.  Affect euthymic.  Thoughts lucid no evidence of loosening of associations or delusions.  No suicidal or homicidal thought   Assessment and Plan: Continue current medication plan follow up in 3 months   Follow Up Instructions:    I discussed the assessment and treatment plan with the patient. The patient was provided an opportunity to ask questions and all were answered. The patient agreed with the plan and demonstrated an understanding of the instructions.   The patient was advised to call back or seek an in-person evaluation if the symptoms worsen or if the condition fails to improve as anticipated.  I provided 20 minutes of non-face-to-face time during this encounter.   Alethia Berthold, MD

## 2022-01-24 ENCOUNTER — Ambulatory Visit: Payer: Self-pay | Admitting: *Deleted

## 2022-01-24 NOTE — Telephone Encounter (Signed)
Per agent: "Pt body hurts,sore throat,earache. Pt stated this started yesterday.  Requesting z-pack.   Seeking clinical advice"   Chief Complaint: Cough, sore throat Symptoms: Non productive cough, sore throat, earache, LGT Frequency: Onset yesterday Pertinent Negatives: Patient denies SOB, wheezing,sinus pain Disposition: [] ED /[] Urgent Care (no appt availability in office) / [x] Appointment(In office/virtual)/ []  [place] [facility]/ [] Home Care/ [] Refused Recommended Disposition /[] [place] [facility]/ []  Follow-up with PCP Additional Notes: Appt made for tomorrow with Dr. Caryn Section, MyChart Virtual Tested neg for Covid this AM. Home test. Reason for Disposition  [1] Continuous (nonstop) coughing interferes with work or school AND [2] no improvement using cough treatment per Care Advice  Answer Assessment - Initial Assessment Questions 1. ONSET: "When did the cough begin?"      Yesterday 2. SEVERITY: "How bad is the cough today?"    severe 3. SPUTUM: "Describe the color of your sputum" (none, dry cough; clear, white, yellow, green)     dry 4. HEMOPTYSIS: "Are you coughing up any blood?" If so ask: "How much?" (flecks, streaks, tablespoons, etc.)     no 5. DIFFICULTY BREATHING: "Are you having difficulty breathing?" If Yes, ask: "How bad is it?" (e.g., mild, moderate, severe)    - MILD: No SOB at rest, mild SOB with walking, speaks normally in sentences, can lie down, no retractions, pulse < 100.    - MODERATE: SOB at rest, SOB with minimal exertion and prefers to sit, cannot lie down flat, speaks in phrases, mild retractions, audible wheezing, pulse 100-120.    - SEVERE: Very SOB at rest, speaks in single words, struggling to breathe, sitting hunched forward, retractions, pulse > 120      no 6. FEVER: "Do you have a fever?" If Yes, ask: "What is your temperature, how was it measured, and when did it start?"     99.7 10. OTHER SYMPTOMS: "Do you have any other symptoms?" (e.g.,  runny nose, wheezing, chest pain)       Body aches, 99.7 sore throat and earache  Protocols used: Cough - Acute Non-Productive-A-AH

## 2022-01-25 ENCOUNTER — Telehealth (INDEPENDENT_AMBULATORY_CARE_PROVIDER_SITE_OTHER): Payer: Self-pay | Admitting: Family Medicine

## 2022-01-25 ENCOUNTER — Other Ambulatory Visit: Payer: Self-pay

## 2022-01-25 DIAGNOSIS — J069 Acute upper respiratory infection, unspecified: Secondary | ICD-10-CM

## 2022-01-25 DIAGNOSIS — H6692 Otitis media, unspecified, left ear: Secondary | ICD-10-CM

## 2022-01-25 MED ORDER — AZITHROMYCIN 250 MG PO TABS: ORAL_TABLET | ORAL | 0 refills | Status: AC

## 2022-01-25 NOTE — Progress Notes (Signed)
MyChart Video Visit    Virtual Visit via Video Note   This visit type was conducted due to national recommendations for restrictions regarding the COVID-19 Pandemic (e.g. social distancing) in an effort to limit this patient's exposure and mitigate transmission in our community. This patient is at least at moderate risk for complications without adequate follow up. This format is felt to be most appropriate for this patient at this time. Physical exam was limited by quality of the video and audio technology used for the visit.   Patient location: home Provider location: [facility]  I discussed the limitations of evaluation and management by telemedicine and the availability of in person appointments. The patient expressed understanding and agreed to proceed.  Patient: Joel Burgess   DOB: 05-10-1976   46 y.o. Male  MRN: [id number] Visit Date: 01/25/2022  Today's healthcare provider: Lelon Huh, MD    Subjective    Cough This is a new problem. Episode onset: 2 days ago. The problem has been waxing and waning (symptoms worse at night). Associated symptoms include ear pain (left ear), myalgias, rhinorrhea and a sore throat. Pertinent negatives include no chest pain, chills, fever, shortness of breath or wheezing. Associated symptoms comments: Temperature 99.7. Treatments tried: Mucinex. The treatment provided mild relief.   Patient reports that he took a home COVID test yesterday and the result was negative.   Medications: Outpatient Medications Prior to Visit  Medication Sig   albuterol (VENTOLIN HFA) 108 (90 Base) MCG/ACT inhaler INHALE 2 PUFFS EVERY 6 HOURS AS NEEDED FOR WHEEZING   ALPRAZolam (XANAX) 1 MG tablet Take 1 tablet (1 mg total) by mouth daily as needed for anxiety.   amphetamine-dextroamphetamine (ADDERALL) 30 MG tablet Take 1 tablet by mouth 2 (two) times daily with a meal.   amphetamine-dextroamphetamine (ADDERALL) 30 MG tablet Take 1 tablet by mouth 2 (two) times daily  with a meal.   amphetamine-dextroamphetamine (ADDERALL) 30 MG tablet Take 1 tablet by mouth 2 (two) times daily with a meal.   azithromycin (ZITHROMAX) 250 MG tablet Two tablets on day 1, one tablet each of the following four days.   diazepam (VALIUM) 10 MG tablet Take 1 tablet (10 mg total) by mouth at bedtime as needed. For muscle spasm   diazepam (VALIUM) 10 MG tablet Take 1 tablet (10 mg total) by mouth 2 (two) times daily. for sleep   No facility-administered medications prior to visit.    Review of Systems  Constitutional:  Negative for appetite change, chills and fever.  HENT:  Positive for ear pain (left ear), rhinorrhea and sore throat.   Respiratory:  Positive for cough (non productive). Negative for chest tightness, shortness of breath and wheezing.   Cardiovascular:  Negative for chest pain and palpitations.  Gastrointestinal:  Negative for abdominal pain, nausea and vomiting.  Musculoskeletal:  Positive for myalgias.     Objective    There were no vitals taken for this visit.   Physical Exam    General: Appearance:    Well developed, well nourished male in no acute distress  Eyes:    PERRL, conjunctiva/corneas clear, EOM's intact       Lungs:     Clear to auscultation bilaterally, respirations unlabored  Heart:    Normal heart rate. Normal rhythm. No murmurs, rubs, or gallops.    MS:   All extremities are intact.    Neurologic:   Awake, alert, oriented x 3. No apparent focal neurological defect.  Assessment & Plan     1. Left otitis media, unspecified otitis media type  - azithromycin (ZITHROMAX) 250 MG tablet; Take 2 tablets on day 1, then 1 tablet daily on days 2 through 5  Dispense: 6 tablet; Refill: 0  2. Upper respiratory tract infection, unspecified type  Call if symptoms change or if not rapidly improving.          I discussed the assessment and treatment plan with the patient. The patient was provided an opportunity to ask questions and all  were answered. The patient agreed with the plan and demonstrated an understanding of the instructions.   The patient was advised to call back or seek an in-person evaluation if the symptoms worsen or if the condition fails to improve as anticipated.  I provided 12 minutes of non-face-to-face time during this encounter.  The entirety of the information documented in the History of Present Illness, Review of Systems and Physical Exam were personally obtained by me. Portions of this information were initially documented by the CMA and reviewed by me for thoroughness and accuracy.    Lelon Huh, MD [facility] [phone] (phone) [phone] (fax)  [place]

## 2022-04-11 ENCOUNTER — Telehealth (INDEPENDENT_AMBULATORY_CARE_PROVIDER_SITE_OTHER): Payer: Self-pay | Admitting: Psychiatry

## 2022-04-11 DIAGNOSIS — F902 Attention-deficit hyperactivity disorder, combined type: Secondary | ICD-10-CM

## 2022-04-11 DIAGNOSIS — F411 Generalized anxiety disorder: Secondary | ICD-10-CM

## 2022-04-11 MED ORDER — AMPHETAMINE-DEXTROAMPHETAMINE 30 MG PO TABS: 30.0000 mg | ORAL_TABLET | Freq: Two times a day (BID) | ORAL | 0 refills | Status: DC

## 2022-04-11 MED ORDER — ALPRAZOLAM 1 MG PO TABS: 1.0000 mg | ORAL_TABLET | Freq: Every day | ORAL | 2 refills | Status: DC | PRN

## 2022-04-11 MED ORDER — DIAZEPAM 10 MG PO TABS: 10.0000 mg | ORAL_TABLET | Freq: Two times a day (BID) | ORAL | 2 refills | Status: DC

## 2022-04-11 NOTE — Progress Notes (Signed)
Virtual Visit via Telephone Note ? ?I connected with Joel Burgess on 04/11/22 at  2:00 PM EDT by telephone and verified that I am speaking with the correct person using two identifiers. ? ?Location: ?Patient: Home ?Provider: Hospital ?  ?I discussed the limitations, risks, security and privacy concerns of performing an evaluation and management service by telephone and the availability of in person appointments. I also discussed with the patient that there may be a patient responsible charge related to this service. The patient expressed understanding and agreed to proceed. ? ? ?History of Present Illness: Patient reached by telephone for follow-up appointment.  Identities established.  Patient had no new complaints.  Reports that pain is under good control and anxiety is under good control.  Sleeping well at night.  Appetite normal.  Attention and focus are adequate.  Not having any side effects.  No appetite diminishment no confusion and no psychotic thinking.  No problems with anger or irritability. ? ?  ?Observations/Objective: Alert and oriented.  Normal affect.  Appropriate interaction style no sign of confusion or delirium. ? ? ?Assessment and Plan: Reviewed usage of medicine with the Valium being more targeted towards his chronic muscular pain and the Xanax at a lower dose.  Adderall stable.  Follow up in another 3 months ? ? ?Follow Up Instructions: ? ?  ?I discussed the assessment and treatment plan with the patient. The patient was provided an opportunity to ask questions and all were answered. The patient agreed with the plan and demonstrated an understanding of the instructions. ?  ?The patient was advised to call back or seek an in-person evaluation if the symptoms worsen or if the condition fails to improve as anticipated. ? ?I provided 20 minutes of non-face-to-face time during this encounter. ? ? ?Alethia Berthold, MD  ?

## 2022-05-24 ENCOUNTER — Telehealth: Payer: Self-pay | Admitting: Psychiatry

## 2022-05-24 NOTE — Telephone Encounter (Signed)
Patient called stating [facility] is out of Adderall and he has one day supply remaining. Requests new script for Adderall and transfer of other prescriptions to [facility] on [address] in [place]. Attempted to transfer the call to [place] but he was resistant. Provider and CMA messaged regarding this request.

## 2022-05-27 ENCOUNTER — Other Ambulatory Visit: Payer: Self-pay | Admitting: Psychiatry

## 2022-06-03 ENCOUNTER — Encounter: Payer: Self-pay | Admitting: Family Medicine

## 2022-06-03 ENCOUNTER — Ambulatory Visit: Payer: Self-pay | Admitting: Family Medicine

## 2022-06-03 ENCOUNTER — Ambulatory Visit (INDEPENDENT_AMBULATORY_CARE_PROVIDER_SITE_OTHER): Payer: Self-pay | Admitting: Family Medicine

## 2022-06-03 VITALS — BP 111/72 | HR 63 | Temp 98.3°F | Resp 14 | Wt 144.4 lb

## 2022-06-03 DIAGNOSIS — G5621 Lesion of ulnar nerve, right upper limb: Secondary | ICD-10-CM

## 2022-06-03 DIAGNOSIS — M25541 Pain in joints of right hand: Secondary | ICD-10-CM

## 2022-06-03 DIAGNOSIS — M25542 Pain in joints of left hand: Secondary | ICD-10-CM

## 2022-06-03 DIAGNOSIS — S46911A Strain of unspecified muscle, fascia and tendon at shoulder and upper arm level, right arm, initial encounter: Secondary | ICD-10-CM

## 2022-06-03 DIAGNOSIS — M7551 Bursitis of right shoulder: Secondary | ICD-10-CM

## 2022-06-03 MED ORDER — PREDNISONE 10 MG PO TABS: ORAL_TABLET | ORAL | 0 refills | Status: AC

## 2022-06-03 NOTE — Progress Notes (Signed)
I,Roshena L Chambers,acting as a scribe for Lelon Huh, MD.,have documented all relevant documentation on the behalf of Lelon Huh, MD,as directed by  Lelon Huh, MD while in the presence of Lelon Huh, MD.   Established patient visit   Patient: Joel Burgess   DOB: 1976/03/25   46 y.o. Male  MRN: [id number] Visit Date: 06/03/2022  Today's healthcare provider: Lelon Huh, MD   Chief Complaint  Patient presents with   Hand Pain   Subjective    Hand Pain  Incident onset: 2 days ago. There was no injury mechanism. The pain is present in the right hand. The pain radiates to the right arm (up right arm to the shoulder). Associated symptoms include muscle weakness, numbness and tingling. Pertinent negatives include no chest pain.   Patient reports that he had tingling and numbness in his right hand 2-3 weeks before he started having hand pain. Had history of left carpal tunnel syndrome with similar symptoms prior to past carpal tunnel release.   Medications: Outpatient Medications Prior to Visit  Medication Sig   albuterol (VENTOLIN HFA) 108 (90 Base) MCG/ACT inhaler INHALE 2 PUFFS EVERY 6 HOURS AS NEEDED FOR WHEEZING   ALPRAZolam (XANAX) 1 MG tablet Take 1 tablet (1 mg total) by mouth daily as needed for anxiety.   amphetamine-dextroamphetamine (ADDERALL) 30 MG tablet Take 1 tablet by mouth 2 (two) times daily with a meal.   amphetamine-dextroamphetamine (ADDERALL) 30 MG tablet Take 1 tablet by mouth 2 (two) times daily with a meal.   amphetamine-dextroamphetamine (ADDERALL) 30 MG tablet Take 1 tablet by mouth 2 (two) times daily with a meal.   diazepam (VALIUM) 10 MG tablet Take 1 tablet (10 mg total) by mouth at bedtime as needed. For muscle spasm   diazepam (VALIUM) 10 MG tablet Take 1 tablet (10 mg total) by mouth 2 (two) times daily. for sleep   No facility-administered medications prior to visit.    Review of Systems  Constitutional:  Negative for appetite  change, chills and fever.  Respiratory:  Negative for chest tightness, shortness of breath and wheezing.   Cardiovascular:  Negative for chest pain and palpitations.  Gastrointestinal:  Negative for abdominal pain, nausea and vomiting.  Musculoskeletal:  Positive for arthralgias and joint swelling.  Neurological:  Positive for tingling and numbness.       Objective    BP 111/72 (BP Location: Left Arm, Patient Position: Sitting, Cuff Size: Normal)   Pulse 63   Temp 98.3 F (36.8 C) (Oral)   Resp 14   Wt 144 lb 6.4 oz (65.5 kg)   SpO2 98% Comment: room air  BMI 22.62 kg/m    Physical Exam   General: Appearance:    Well developed, well nourished male in no acute distress  Eyes:    PERRL, conjunctiva/corneas clear, EOM's intact       Lungs:     Clear to auscultation bilaterally, respirations unlabored  Heart:    Normal heart rate. Normal rhythm. No murmurs, rubs, or gallops.    MS:   All extremities are intact.  Tender over right acromium. ROM of shoulder limited to 45 degrees. Limited flexion and extension of fingers of both hands.   Neurologic:   Awake, alert, oriented x 3. No apparent focal neurological defect.         Assessment & Plan     1. Ulnar neuropathy of right upper extremity   2. Shoulder strain, right, initial encounter   3.  Subacromial bursitis of right shoulder joint   4. Arthralgia of both hands  - predniSONE (DELTASONE) 10 MG tablet; 6 tablets for 2 days, then 5 for 2 days, then 4 for 2 days, then 3 for 2 days, then 2 for 2 days, then 1 for 2 days.  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 Portions of this information were initially documented by the CMA and reviewed by me for thoroughness and accuracy.     Lelon Huh, MD   [facility] [phone] (phone) [phone] (fax)  [place]

## 2022-06-03 NOTE — Telephone Encounter (Signed)
  Chief Complaint: right hand pain Symptoms: numb and painful Frequency: constant, getting worse Pertinent Negatives: Patient denies fever or rash Disposition: '[]'$ ED /'[]'$ Urgent Care (no appt availability in office) / '[x]'$ Appointment(In office/virtual)/ '[]'$  [place] [facility]/ '[]'$ Home Care/ '[]'$ Refused Recommended Disposition /'[]'$ [place] [facility]/ '[]'$  Follow-up with PCP Additional Notes: stated he has a disc problem in neck,  appt today, he was sent home from work due to the swelling. Will need a note.  Reason for Disposition  [1] MODERATE pain (e.g., interferes with normal activities) AND [2] present > 3 days  Answer Assessment - Initial Assessment Questions 1. ONSET: "When did the pain start?"     Started two weeks 2. LOCATION: "Where is the pain located?"     Hand, right, now swelling 3. PAIN: "How bad is the pain?" (Scale 1-10; or mild, moderate, severe)   - MILD (1-3): doesn't interfere with normal activities   - MODERATE (4-7): interferes with normal activities (e.g., work or school) or awakens from sleep   - SEVERE (8-10): excruciating pain, unable to use hand at all     Awakens him, pretty bad 4. WORK OR EXERCISE: "Has there been any recent work or exercise that involved this part (i.e., hand or wrist) of the body?"     no 5. CAUSE: "What do you think is causing the pain?"     unsure 6. AGGRAVATING FACTORS: "What makes the pain worse?" (e.g., using computer)     Movement or fingers, starts getting numb and hurting 7. OTHER SYMPTOMS: "Do you have any other symptoms?" (e.g., neck pain, swelling, rash, numbness, fever)     Has a bad disc on neck, takes med for it. 8. PREGNANCY: "Is there any chance you are pregnant?" "When was your last menstrual period?"     na  Protocols used: Hand and Wrist Pain-A-AH

## 2022-06-06 ENCOUNTER — Ambulatory Visit: Payer: Self-pay | Admitting: *Deleted

## 2022-06-06 NOTE — Telephone Encounter (Signed)
  Chief Complaint: Hand pain Symptoms: Right hand pain 8-9/10, numbness at times, Frequency: Saw Dr. Caryn Section 06/03/22, taking prednisone Pertinent Negatives: Patient denies   Disposition: '[]'$ ED /'[]'$ Urgent Care (no appt availability in office) / '[]'$ Appointment(In office/virtual)/ '[]'$  [facility]/ '[]'$ Home Care/ '[]'$ Refused Recommended Disposition /'[]'$ [facility]/ '[x]'$  Follow-up with PCP Additional Notes: Pt states called attorney regarding staying out of work. "I have a simple form to be filled out.  Told to follow up with my doctor." States is taking prednisone as ordered, "Worse, crying at night so bad." Please advise. Reason for Disposition  Numbness (i.e., loss of sensation) in hand or fingers (Exception: just tingling; numbness present > 2 weeks)  Answer Assessment - Initial Assessment Questions 1. ONSET: "When did the pain start?"     Saw PCP 06/03/22 2. LOCATION: "Where is the pain located?"     Right hand 3. PAIN: "How bad is the pain?" (Scale 1-10; or mild, moderate, severe)   - MILD (1-3): doesn't interfere with normal activities   - MODERATE (4-7): interferes with normal activities (e.g., work or school) or awakens from sleep   - SEVERE (8-10): excruciating pain, unable to use hand at all     8-9/10 4. WORK OR EXERCISE: "Has there been any recent work or exercise that involved this part (i.e., hand or wrist) of the body?"     "Over use" 5. CAUSE: "What do you think is causing the pain?"   6. AGGRAVATING FACTORS: "What makes the pain worse?" (e.g., using computer)      7. OTHER SYMPTOMS: "Do you have any other symptoms?" (e.g., neck pain, swelling, rash, numbness, fever)     Numbness at times, swelling  Protocols used: Hand and Wrist Pain-A-AH

## 2022-06-07 ENCOUNTER — Ambulatory Visit (INDEPENDENT_AMBULATORY_CARE_PROVIDER_SITE_OTHER): Payer: Self-pay | Admitting: Family Medicine

## 2022-06-07 ENCOUNTER — Encounter: Payer: Self-pay | Admitting: Family Medicine

## 2022-06-07 VITALS — BP 123/84 | HR 83 | Temp 97.8°F | Resp 12 | Wt 145.0 lb

## 2022-06-07 DIAGNOSIS — S46911A Strain of unspecified muscle, fascia and tendon at shoulder and upper arm level, right arm, initial encounter: Secondary | ICD-10-CM

## 2022-06-07 DIAGNOSIS — G5621 Lesion of ulnar nerve, right upper limb: Secondary | ICD-10-CM

## 2022-06-07 NOTE — Progress Notes (Signed)
I,Roshena L Chambers,acting as a scribe for Lelon Huh, MD.,have documented all relevant documentation on the behalf of Lelon Huh, MD,as directed by  Lelon Huh, MD while in the presence of Lelon Huh, MD.   Established patient visit   Patient: Joel Burgess   DOB: 12-07-1976   46 y.o. Male  MRN: [id number] Visit Date: 06/07/2022  Today's healthcare provider: Lelon Huh, MD   Chief Complaint  Patient presents with   Hand Pain   Subjective    HPI  Follow up for right ulnar neuropathy:  The patient was last seen for this 4 days ago. Changes made at last visit include starting 12 day prednisone taper.  He reports good compliance with treatment. He feels that condition is Worse. He  still has weakness, pain and numbness of the right hand and wrist. Patient states the pain wakes him from his sleep. Patient has been favoring the left hand more, which has now caused swelling around the left wrist.   -----------------------------------------------------------------------------------------   Medications: Outpatient Medications Prior to Visit  Medication Sig   albuterol (VENTOLIN HFA) 108 (90 Base) MCG/ACT inhaler INHALE 2 PUFFS EVERY 6 HOURS AS NEEDED FOR WHEEZING   ALPRAZolam (XANAX) 1 MG tablet Take 1 tablet (1 mg total) by mouth daily as needed for anxiety.   amphetamine-dextroamphetamine (ADDERALL) 30 MG tablet Take 1 tablet by mouth 2 (two) times daily with a meal.   amphetamine-dextroamphetamine (ADDERALL) 30 MG tablet Take 1 tablet by mouth 2 (two) times daily with a meal.   amphetamine-dextroamphetamine (ADDERALL) 30 MG tablet Take 1 tablet by mouth 2 (two) times daily with a meal.   diazepam (VALIUM) 10 MG tablet Take 1 tablet (10 mg total) by mouth at bedtime as needed. For muscle spasm   diazepam (VALIUM) 10 MG tablet Take 1 tablet (10 mg total) by mouth 2 (two) times daily. for sleep   predniSONE (DELTASONE) 10 MG tablet 6 tablets for 2 days, then 5 for 2  days, then 4 for 2 days, then 3 for 2 days, then 2 for 2 days, then 1 for 2 days.   No facility-administered medications prior to visit.    Review of Systems  Constitutional:  Negative for appetite change, chills and fever.  Respiratory:  Negative for chest tightness, shortness of breath and wheezing.   Cardiovascular:  Negative for chest pain and palpitations.  Gastrointestinal:  Negative for abdominal pain, nausea and vomiting.  Musculoskeletal:  Positive for arthralgias and joint swelling.  Neurological:  Positive for weakness.       Objective    BP 123/84 (BP Location: Left Arm, Patient Position: Sitting, Cuff Size: Normal)   Pulse 83   Temp 97.8 F (36.6 C) (Oral)   Resp 12   Wt 145 lb (65.8 kg)   SpO2 98% Comment: room air  BMI 22.71 kg/m    Physical Exam    General: Appearance:    Well developed, well nourished male in no acute distress  Eyes:    PERRL, conjunctiva/corneas clear, EOM's intact       Lungs:     Clear to auscultation bilaterally, respirations unlabored  Heart:    Normal heart rate. Normal rhythm. No murmurs, rubs, or gallops.    MS:   All extremities are intact.    Neurologic:   Awake, alert, oriented x 3. No apparent focal neurological defect.         Assessment & Plan     1. Ulnar neuropathy of right  upper extremity  - Ambulatory referral to Orthopedic Surgery  2. Shoulder strain, right, initial encounter  - Ambulatory referral to Orthopedic Surgery        The entirety of the information documented in the History of Present Illness, Review of Systems and Physical Exam were personally obtained by me. Portions of this information were initially documented by the CMA and reviewed by me for thoroughness and accuracy.     Lelon Huh, MD  [facility] [phone] (phone) [phone] (fax)  [place]

## 2022-06-07 NOTE — Patient Instructions (Addendum)
Please review the attached list of medications and notify my office if there are any errors.   We'll set up in orthopedic referral, but your pain gets worse then go to [facility] at [facility], across the street from [facility].

## 2022-06-11 ENCOUNTER — Telehealth (INDEPENDENT_AMBULATORY_CARE_PROVIDER_SITE_OTHER): Payer: Self-pay | Admitting: Psychiatry

## 2022-06-11 DIAGNOSIS — F902 Attention-deficit hyperactivity disorder, combined type: Secondary | ICD-10-CM

## 2022-06-11 DIAGNOSIS — F411 Generalized anxiety disorder: Secondary | ICD-10-CM

## 2022-06-11 MED ORDER — AMPHETAMINE-DEXTROAMPHETAMINE 30 MG PO TABS: 30.0000 mg | ORAL_TABLET | Freq: Two times a day (BID) | ORAL | 0 refills | Status: DC

## 2022-06-11 NOTE — Progress Notes (Signed)
Virtual Visit via Telephone Note  I connected with Burna Sis on 06/11/22 at  1:00 PM EDT by telephone and verified that I am speaking with the correct person using two identifiers.  Location: Patient: Home Provider: Hospital   I discussed the limitations, risks, security and privacy concerns of performing an evaluation and management service by telephone and the availability of in person appointments. I also discussed with the patient that there may be a patient responsible charge related to this service. The patient expressed understanding and agreed to proceed.   History of Present Illness: Patient upset because his hands are now both injured he cannot work he is very frustrated about it.  Mood anxious and sometimes irritable.  No suicidal thoughts.  No evidence psychosis    Observations/Objective: Upset but redirectable not psychotic not threatening no evidence of dangerousness   Assessment and Plan: Supportive therapy and encouragement.  No change to medicine we can follow up in another 4 months or so as needed.   Follow Up Instructions:    I discussed the assessment and treatment plan with the patient. The patient was provided an opportunity to ask questions and all were answered. The patient agreed with the plan and demonstrated an understanding of the instructions.   The patient was advised to call back or seek an in-person evaluation if the symptoms worsen or if the condition fails to improve as anticipated.  I provided 20 minutes of non-face-to-face time during this encounter.   Alethia Berthold, MD

## 2022-06-11 NOTE — Progress Notes (Signed)
.  vir

## 2022-06-13 ENCOUNTER — Ambulatory Visit: Payer: Self-pay

## 2022-06-13 ENCOUNTER — Telehealth: Payer: Self-pay

## 2022-06-13 NOTE — Telephone Encounter (Signed)
Summary: swollen hand   Right hand is getting worse and is still swollen after taking prednisone / please advise     Pt refused triage stated that he wants to speak to Dr. Caryn Section directly. Advised that will send note to PCP nurse.  Pt stated he is upset that OV note not complete and that "referral made for shoulder." Advised pt referral is for hand and shoulder issues. Pt with complaints and "I only want to speak to Dr. Caryn Section. Called PCP office and advised Judson Roch of pt complaint. Answer Assessment - Initial Assessment Questions N/A Pt refused triage. See documentation.  Protocols used: No Contact or Duplicate Contact Call-A-AH

## 2022-06-13 NOTE — Telephone Encounter (Signed)
Copied from [place] [id number]. Topic: General - Other >> Jun 13, 2022  9:11 AM Chapman Fitch wrote: Reason for CRM: pt called to see abut the letter he left for Dr. Caryn Section please advise

## 2022-06-14 NOTE — Telephone Encounter (Signed)
Form he left at his recent office visit has been completed.

## 2022-06-21 NOTE — Telephone Encounter (Signed)
Form placed up front for pick up. Patient advised.

## 2022-06-27 NOTE — Progress Notes (Deleted)
      Established patient visit   Patient: Joel Burgess   DOB: January 29, 1976   46 y.o. Male  MRN: [id number] Visit Date: 06/28/2022  Today's healthcare provider: Lelon Huh, MD   No chief complaint on file.  Subjective    HPI  ***  Medications: Outpatient Medications Prior to Visit  Medication Sig   albuterol (VENTOLIN HFA) 108 (90 Base) MCG/ACT inhaler INHALE 2 PUFFS EVERY 6 HOURS AS NEEDED FOR WHEEZING   ALPRAZolam (XANAX) 1 MG tablet Take 1 tablet (1 mg total) by mouth daily as needed for anxiety.   amphetamine-dextroamphetamine (ADDERALL) 30 MG tablet Take 1 tablet by mouth 2 (two) times daily with a meal.   amphetamine-dextroamphetamine (ADDERALL) 30 MG tablet Take 1 tablet by mouth 2 (two) times daily with a meal.   amphetamine-dextroamphetamine (ADDERALL) 30 MG tablet Take 1 tablet by mouth 2 (two) times daily with a meal.   diazepam (VALIUM) 10 MG tablet Take 1 tablet (10 mg total) by mouth at bedtime as needed. For muscle spasm   diazepam (VALIUM) 10 MG tablet Take 1 tablet (10 mg total) by mouth 2 (two) times daily. for sleep   No facility-administered medications prior to visit.    Review of Systems  {Labs  Heme  Chem  Endocrine  Serology  Results Review (optional):23779}   Objective    There were no vitals taken for this visit. {Show previous vital signs (optional):23777}  Physical Exam  ***  No results found for any visits on 06/28/22.  Assessment & Plan     ***  No follow-ups on file.      {provider attestation***:1}   Lelon Huh, MD  [facility] [phone] (phone) [phone] (fax)  [place]

## 2022-06-28 ENCOUNTER — Ambulatory Visit: Payer: Self-pay | Admitting: Family Medicine

## 2022-07-17 ENCOUNTER — Telehealth: Payer: Self-pay

## 2022-07-17 NOTE — Telephone Encounter (Signed)
Copied from [place]. Topic: Referral - Request for Referral >> Jul 17, 2022 11:18 AM Oley Balm E wrote: Pt called requesting to have his referral changed over to [place], did not have a fax number to provide. Pt is requesting a call back from referrals, has questions

## 2022-07-17 NOTE — Telephone Encounter (Signed)
Pt is scheduled at [facility] to see Dr Dorna Bloom 07/22/22 at 8:30 and has been advised

## 2022-07-22 ENCOUNTER — Other Ambulatory Visit: Payer: Self-pay | Admitting: Psychiatry

## 2022-07-22 MED ORDER — AMPHETAMINE-DEXTROAMPHETAMINE 30 MG PO TABS: 30.0000 mg | ORAL_TABLET | Freq: Two times a day (BID) | ORAL | 0 refills | Status: DC

## 2022-07-29 ENCOUNTER — Telehealth: Payer: Self-pay

## 2022-07-29 ENCOUNTER — Telehealth: Payer: Self-pay | Admitting: Psychiatry

## 2022-07-29 ENCOUNTER — Other Ambulatory Visit: Payer: Self-pay | Admitting: Psychiatry

## 2022-07-29 MED ORDER — ALPRAZOLAM 1 MG PO TABS: 1.0000 mg | ORAL_TABLET | Freq: Every day | ORAL | 2 refills | Status: DC | PRN

## 2022-07-29 NOTE — Telephone Encounter (Signed)
Patient called stating he is out of medication. The  pharmacy does not have refills on Xanax and Diazepam. He ran out yesterday and today headache is starting he stated. Please call in for 3 month supply. He would like this done today and will come by if needed. Advised him the doctor is not in but I am putting the message in.

## 2022-07-29 NOTE — Telephone Encounter (Signed)
received fax requesting a refill on the alprazolam '1mg'$  pt last seen on 06-11-22  ALPRAZolam Duanne Moron) 1 MG tablet Medication Date: 04/11/2022 Department: [facility] Ordering/Authorizing: Weber Cooks Madie Reno, MD   Order Providers  Prescribing Provider Encounter Provider  Clapacs, Madie Reno, MD Clapacs, Madie Reno, MD   Outpatient Medication Detail   Disp Refills Start End   ALPRAZolam Duanne Moron) 1 MG tablet 30 tablet 2 04/11/2022    Sig - Route: Take 1 tablet (1 mg total) by mouth daily as needed for anxiety. - Oral   Sent to pharmacy as: ALPRAZolam Duanne Moron) 1 MG tablet   E-Prescribing Status: Receipt confirmed by pharmacy (04/11/2022  2:23 PM EDT)

## 2022-07-30 ENCOUNTER — Telehealth: Payer: Self-pay | Admitting: Psychiatry

## 2022-07-30 ENCOUNTER — Other Ambulatory Visit: Payer: Self-pay | Admitting: Psychiatry

## 2022-07-30 MED ORDER — DIAZEPAM 10 MG PO TABS
10.0000 mg | ORAL_TABLET | Freq: Every evening | ORAL | 1 refills | Status: DC | PRN
Start: 2022-07-30 — End: 2022-10-01

## 2022-10-01 ENCOUNTER — Telehealth (INDEPENDENT_AMBULATORY_CARE_PROVIDER_SITE_OTHER): Payer: Self-pay | Admitting: Psychiatry

## 2022-10-01 DIAGNOSIS — F902 Attention-deficit hyperactivity disorder, combined type: Secondary | ICD-10-CM

## 2022-10-01 DIAGNOSIS — F411 Generalized anxiety disorder: Secondary | ICD-10-CM

## 2022-10-01 MED ORDER — DIAZEPAM 10 MG PO TABS: 10.0000 mg | ORAL_TABLET | Freq: Every evening | ORAL | 1 refills | Status: AC | PRN

## 2022-10-01 MED ORDER — AMPHETAMINE-DEXTROAMPHETAMINE 30 MG PO TABS: 30.0000 mg | ORAL_TABLET | Freq: Every day | ORAL | 0 refills | Status: DC

## 2022-10-01 MED ORDER — AMPHETAMINE-DEXTROAMPHETAMINE 30 MG PO TABS
30.0000 mg | ORAL_TABLET | Freq: Every day | ORAL | 0 refills | Status: DC
Start: 2022-10-01 — End: 2022-10-18

## 2022-10-01 MED ORDER — ALPRAZOLAM 1 MG PO TABS
1.0000 mg | ORAL_TABLET | Freq: Two times a day (BID) | ORAL | 2 refills | Status: DC | PRN
Start: 2022-10-01 — End: 2023-01-09

## 2022-10-01 NOTE — Progress Notes (Signed)
Virtual Visit via Telephone Note  I connected with Joel Burgess on 10/01/22 at  2:00 PM EDT by telephone and verified that I am speaking with the correct person using two identifiers.  Location: Patient: Home Provider: Hospital   I discussed the limitations, risks, security and privacy concerns of performing an evaluation and management service by telephone and the availability of in person appointments. I also discussed with the patient that there may be a patient responsible charge related to this service. The patient expressed understanding and agreed to proceed.   History of Present Illness: Patient was reached by telephone.  Continues to suffer with pain in both of his hands extremely limiting.  Waiting for further evaluation but meanwhile it is very much limiting his ability to do anything.  Patient is requesting to decrease Adderall to once a day because he no longer needs the longer period of concentration but to increase Xanax as he is often needing 2 of them in a day.  Denies any psychotic symptoms denies suicidal ideation.    Observations/Objective: Alert oriented and anxious sounding sad but not disorganized or confused.  No anger problems no suicidal or homicidal ideation   Assessment and Plan: Adjust Adderall down to only 130 mg a day and Xanax to 2 1 mg a day no change to the Valium dose follow-up and 3 months   Follow Up Instructions:    I discussed the assessment and treatment plan with the patient. The patient was provided an opportunity to ask questions and all were answered. The patient agreed with the plan and demonstrated an understanding of the instructions.   The patient was advised to call back or seek an in-person evaluation if the symptoms worsen or if the condition fails to improve as anticipated.  I provided 20 minutes of non-face-to-face time during this encounter.   Alethia Berthold, MD

## 2022-10-17 NOTE — Progress Notes (Signed)
I,Joseline E Rosas,acting as a scribe for Lelon Huh, MD.,have documented all relevant documentation on the behalf of Lelon Huh, MD,as directed by  Lelon Huh, MD while in the presence of Lelon Huh, MD.   Established patient visit   Patient: Joel Burgess   DOB: 1976/03/31   46 y.o. Male  MRN: [id number] Visit Date: 10/18/2022  Today's healthcare provider: Lelon Huh, MD   Chief Complaint  Patient presents with   Weight Loss   Subjective    HPI  Patient is requesting referral to Gastroenterologist to have a colonoscopy.He would like to be screen for prostate cancer.Reports that he has been losing weight and has been eating well. He has been taking shakes to gain weight. Symptoms: increase thirst, fatigue. He reports he has enrolled in [facility] patient assistance.   He does have long history of ADD and is prescribed Adderall by psychiatry, however, he states he has recently reduced from longstanding BID dose to QD   Wt Readings from Last 3 Encounters:  10/18/22 137 lb 3.2 oz (62.2 kg)  06/07/22 145 lb (65.8 kg)  06/03/22 144 lb 6.4 oz (65.5 kg)     Medications: Outpatient Medications Prior to Visit  Medication Sig   albuterol (VENTOLIN HFA) 108 (90 Base) MCG/ACT inhaler INHALE 2 PUFFS EVERY 6 HOURS AS NEEDED FOR WHEEZING   ALPRAZolam (XANAX) 1 MG tablet Take 1 tablet (1 mg total) by mouth 2 (two) times daily as needed for anxiety.   amphetamine-dextroamphetamine (ADDERALL) 30 MG tablet Take 1 tablet by mouth daily.   diazepam (VALIUM) 10 MG tablet Take 1 tablet (10 mg total) by mouth 2 (two) times daily. for sleep   amphetamine-dextroamphetamine (ADDERALL) 30 MG tablet Take 1 tablet by mouth daily.   amphetamine-dextroamphetamine (ADDERALL) 30 MG tablet Take 1 tablet by mouth daily.   diazepam (VALIUM) 10 MG tablet Take 1 tablet (10 mg total) by mouth at bedtime as needed. For muscle spasm   No facility-administered medications prior to visit.    Review of  Systems     Objective    BP 124/78 (BP Location: Left Arm, Patient Position: Sitting, Cuff Size: Normal)   Pulse 60   Temp 98.2 F (36.8 C) (Oral)   Resp 16   Ht '5\' 6"'$  (1.676 m)   Wt 137 lb 3.2 oz (62.2 kg)   SpO2 100%   BMI 22.14 kg/m    Physical Exam   General: Appearance:    Well developed, well nourished male in no acute distress  Eyes:    PERRL, conjunctiva/corneas clear, EOM's intact       Lungs:     Clear to auscultation bilaterally, respirations unlabored  Heart:    Normal heart rate. Normal rhythm. No murmurs, rubs, or gallops.    MS:   All extremities are intact.    Neurologic:   Awake, alert, oriented x 3. No apparent focal neurological defect.         Results for orders placed or performed in visit on 10/18/22  POCT glucose (manual entry)  Result Value Ref Range   POC Glucose 117 (A) 70 - 99 mg/dl  (Not fasting)  Assessment & Plan     1. Unexplained weight loss  - Comprehensive metabolic panel - PSA - TSH - CBC with Differential/Platelet  2. Encounter for special screening examination for cardiovascular disorder  - Lipid panel  3. Colon cancer screening  - Ambulatory referral to gastroenterology for colonoscopy  4. Need for hepatitis C  screening test  - Hepatitis C antibody  5. Encounter for screening for HIV  - HIV Antibody (routine testing w rflx)  6. Prostate cancer screening  - PSA   He states he is enrolled in [facility] patient assistance program and he will contact them regarding where to have blood drawn and scheduling colonoscopy.      The entirety of the information documented in the History of Present Illness, Review of Systems and Physical Exam were personally obtained by me. Portions of this information were initially documented by the CMA and reviewed by me for thoroughness and accuracy.     Lelon Huh, MD  [facility] [phone] (phone) [phone] (fax)  [place]

## 2022-10-18 ENCOUNTER — Encounter: Payer: Self-pay | Admitting: Family Medicine

## 2022-10-18 ENCOUNTER — Ambulatory Visit (INDEPENDENT_AMBULATORY_CARE_PROVIDER_SITE_OTHER): Payer: Self-pay | Admitting: Family Medicine

## 2022-10-18 VITALS — BP 124/78 | HR 60 | Temp 98.2°F | Resp 16 | Ht 66.0 in | Wt 137.2 lb

## 2022-10-18 DIAGNOSIS — Z125 Encounter for screening for malignant neoplasm of prostate: Secondary | ICD-10-CM

## 2022-10-18 DIAGNOSIS — R634 Abnormal weight loss: Secondary | ICD-10-CM

## 2022-10-18 DIAGNOSIS — Z136 Encounter for screening for cardiovascular disorders: Secondary | ICD-10-CM

## 2022-10-18 DIAGNOSIS — Z1159 Encounter for screening for other viral diseases: Secondary | ICD-10-CM

## 2022-10-18 DIAGNOSIS — Z114 Encounter for screening for human immunodeficiency virus [HIV]: Secondary | ICD-10-CM

## 2022-10-18 DIAGNOSIS — Z1211 Encounter for screening for malignant neoplasm of colon: Secondary | ICD-10-CM

## 2022-10-18 LAB — GLUCOSE, POCT (MANUAL RESULT ENTRY): POC Glucose: 117 mg/dl — AB (ref 70–99)

## 2022-10-22 ENCOUNTER — Telehealth: Payer: Self-pay

## 2022-10-22 ENCOUNTER — Telehealth: Payer: Self-pay | Admitting: Family Medicine

## 2022-10-22 NOTE — Telephone Encounter (Signed)
Pt called in to follow up. Pt says that he spoke with [facility] and was told that they have not received referral in the system for him. Pt says that he was told to be sure to have labs sent to them as well. Showing referral to [facility] in system however pt says as of this morning they are not showing.   Please assist further with this.

## 2022-10-22 NOTE — Telephone Encounter (Signed)
A referral for colonoscopy was placed at his visit last week. I have no idea what the problem is

## 2022-10-22 NOTE — Telephone Encounter (Signed)
Copied from [place]. Topic: Referral - Request for Referral >> Oct 21, 2022  2:37 PM Sabas Sous wrote: Has patient seen PCP for this complaint? Yes.   *If NO, is insurance requiring patient see PCP for this issue before PCP can refer them? Referral for which specialty: GI  Preferred provider/office: [facility] Reason for referral: Pt specifically wants a Colonoscopy exam, he says that he specifically requested this during his recent visit. He says he is overdue for his colonoscopy.  Says he waited patiently for this appt, and now this appt is incomplete because of this. >> Oct 21, 2022  2:56 PM Oley Balm E wrote: Pt specifically wants this statement added "He feels that his PCP is playing around with his health, he says that he is not a toy. He is the most concerned about receiving a colonoscopy."

## 2022-10-23 ENCOUNTER — Encounter: Payer: Self-pay | Admitting: Family Medicine

## 2022-11-22 ENCOUNTER — Telehealth: Payer: Self-pay | Admitting: Family Medicine

## 2022-11-22 NOTE — Telephone Encounter (Signed)
Go to urgent care

## 2022-11-22 NOTE — Telephone Encounter (Signed)
Pt is calling to request a script for medication. Reports getting sick with the same sx for 14years. And Dr. Caryn Section has called medication in for him. Pt denies appt Please advise  Sx congestion, cough, body aches, headache, Left ear pain. Preferred [place]

## 2023-01-09 ENCOUNTER — Telehealth (INDEPENDENT_AMBULATORY_CARE_PROVIDER_SITE_OTHER): Payer: Self-pay | Admitting: Psychiatry

## 2023-01-09 DIAGNOSIS — F411 Generalized anxiety disorder: Secondary | ICD-10-CM

## 2023-01-09 DIAGNOSIS — F902 Attention-deficit hyperactivity disorder, combined type: Secondary | ICD-10-CM

## 2023-01-09 MED ORDER — AMPHETAMINE-DEXTROAMPHETAMINE 30 MG PO TABS: 30.0000 mg | ORAL_TABLET | Freq: Every day | ORAL | 0 refills | Status: DC

## 2023-01-09 MED ORDER — DIAZEPAM 10 MG PO TABS: 10.0000 mg | ORAL_TABLET | Freq: Two times a day (BID) | ORAL | 2 refills | Status: DC

## 2023-01-09 MED ORDER — ALPRAZOLAM 1 MG PO TABS: 1.0000 mg | ORAL_TABLET | Freq: Two times a day (BID) | ORAL | 2 refills | Status: DC | PRN

## 2023-01-09 NOTE — Progress Notes (Signed)
Virtual Visit via Telephone Note  I connected with Joel Burgess on 01/09/23 at  1:00 PM EST by telephone and verified that I am speaking with the correct person using two identifiers.  Location: Patient: Home Provider: Hospital   I discussed the limitations, risks, security and privacy concerns of performing an evaluation and management service by telephone and the availability of in person appointments. I also discussed with the patient that there may be a patient responsible charge related to this service. The patient expressed understanding and agreed to proceed.   History of Present Illness: Patient reached by telephone.  Follow-up for this gentleman with ADHD and anxiety.  He reports that he has been generally doing well recently.  He is able to do some work although only a few hours a day and nothing very strenuous.  Health problems have been stabilized.  Pain is better.  Relationships with his family seem to be going well.  He went through a spell at the end of the year of losing weight but says that he realized that he was not eating a appropriate diet.  With some adjustment to that he feels like he is doing better although he is also getting a workup from his primary care doctor.  Denies that he has lost his appetite.    Observations/Objective: Alert and oriented appropriate affect appropriate interactions.  Denies any psychotic symptoms no evidence of dangerousness.  Reviewed all medicines appropriately no sign of abuse   Assessment and Plan: Refilled medications.  Patient is on Adderall and also on both Xanax and Valium.  The Valium has been used as a as needed for his chronic pain.  He does not appear to have episodes of over sedation or abuse from it.  Refilled all medicines.  Check up 3 months   Follow Up Instructions:    I discussed the assessment and treatment plan with the patient. The patient was provided an opportunity to ask questions and all were answered. The patient  agreed with the plan and demonstrated an understanding of the instructions.   The patient was advised to call back or seek an in-person evaluation if the symptoms worsen or if the condition fails to improve as anticipated.  I provided 20 minutes of non-face-to-face time during this encounter.   Alethia Berthold, MD

## 2023-01-22 ENCOUNTER — Encounter: Payer: Self-pay | Admitting: Family Medicine

## 2023-03-27 ENCOUNTER — Other Ambulatory Visit: Payer: Self-pay | Admitting: Psychiatry

## 2023-03-27 MED ORDER — AMPHETAMINE-DEXTROAMPHETAMINE 30 MG PO TABS: 30.0000 mg | ORAL_TABLET | Freq: Every day | ORAL | 0 refills | Status: DC

## 2023-03-27 MED ORDER — ALPRAZOLAM 1 MG PO TABS: 1.0000 mg | ORAL_TABLET | Freq: Two times a day (BID) | ORAL | 2 refills | Status: DC | PRN

## 2023-03-27 MED ORDER — AMPHETAMINE-DEXTROAMPHETAMINE 30 MG PO TABS: 30.0000 mg | ORAL_TABLET | Freq: Every day | ORAL | 0 refills | Status: AC

## 2023-03-27 MED ORDER — DIAZEPAM 10 MG PO TABS: 10.0000 mg | ORAL_TABLET | Freq: Two times a day (BID) | ORAL | 2 refills | Status: DC

## 2023-03-27 NOTE — Progress Notes (Signed)
Refilled medication

## 2023-04-28 ENCOUNTER — Encounter (HOSPITAL_COMMUNITY): Payer: Self-pay

## 2023-04-29 ENCOUNTER — Telehealth (INDEPENDENT_AMBULATORY_CARE_PROVIDER_SITE_OTHER): Payer: Self-pay | Admitting: Psychiatry

## 2023-04-29 DIAGNOSIS — F411 Generalized anxiety disorder: Secondary | ICD-10-CM

## 2023-04-29 DIAGNOSIS — F902 Attention-deficit hyperactivity disorder, combined type: Secondary | ICD-10-CM

## 2023-04-29 MED ORDER — ALPRAZOLAM 1 MG PO TABS: 1.0000 mg | ORAL_TABLET | Freq: Two times a day (BID) | ORAL | 5 refills | Status: AC | PRN

## 2023-04-29 MED ORDER — AMPHETAMINE-DEXTROAMPHETAMINE 30 MG PO TABS: 30.0000 mg | ORAL_TABLET | Freq: Every day | ORAL | 0 refills | Status: AC

## 2023-04-29 MED ORDER — DIAZEPAM 10 MG PO TABS: 10.0000 mg | ORAL_TABLET | Freq: Two times a day (BID) | ORAL | 5 refills | Status: AC

## 2023-04-29 NOTE — Progress Notes (Signed)
Virtual Visit via Telephone Note  I connected with Dossie Arbour on 04/29/23 at  1:20 PM EDT by telephone and verified that I am speaking with the correct person using two identifiers.  Location: Patient: Home Provider: Hospital   I discussed the limitations, risks, security and privacy concerns of performing an evaluation and management service by telephone and the availability of in person appointments. I also discussed with the patient that there may be a patient responsible charge related to this service. The patient expressed understanding and agreed to proceed.   History of Present Illness: Follow-up with this 47 year old man with ADHD and anxiety.  Patient has no specific new complaints.  He is nervous about the fact that I am leaving the office at the end of June.  Ask about referrals.  Patient was reassured that we are sending out information about that now and that he should also begin looking at providers through his insurance plan.  No new complaints.  Focus is good anxiety is under good control no side effects from medicine    Observations/Objective: Alert and oriented.  Euthymic and appropriate sounding affect.  Lucid thinking.   Assessment and Plan: I refilled his medicine with the benzodiazepines for 6 months and added an extra month in July to his ADHD medicines.  Patient was reassured that I am sending out a letter today with suggestions for follow-up.   Follow Up Instructions:    I discussed the assessment and treatment plan with the patient. The patient was provided an opportunity to ask questions and all were answered. The patient agreed with the plan and demonstrated an understanding of the instructions.   The patient was advised to call back or seek an in-person evaluation if the symptoms worsen or if the condition fails to improve as anticipated.  I provided 20 minutes of non-face-to-face time during this encounter.   Mordecai Rasmussen, MD
